# Patient Record
Sex: Female | Born: 1965
Health system: Southern US, Community
[De-identification: ages and names within clinical notes are randomized; demographics above are authoritative.]

## PROBLEM LIST (undated history)

## (undated) DIAGNOSIS — K219 Gastro-esophageal reflux disease without esophagitis: Secondary | ICD-10-CM

## (undated) DIAGNOSIS — F329 Major depressive disorder, single episode, unspecified: Secondary | ICD-10-CM

## (undated) DIAGNOSIS — C801 Malignant (primary) neoplasm, unspecified: Secondary | ICD-10-CM

## (undated) DIAGNOSIS — F419 Anxiety disorder, unspecified: Secondary | ICD-10-CM

## (undated) DIAGNOSIS — M810 Age-related osteoporosis without current pathological fracture: Secondary | ICD-10-CM

## (undated) DIAGNOSIS — H40059 Ocular hypertension, unspecified eye: Secondary | ICD-10-CM

## (undated) DIAGNOSIS — D509 Iron deficiency anemia, unspecified: Secondary | ICD-10-CM

## (undated) DIAGNOSIS — E785 Hyperlipidemia, unspecified: Secondary | ICD-10-CM

## (undated) DIAGNOSIS — N200 Calculus of kidney: Secondary | ICD-10-CM

## (undated) DIAGNOSIS — R51 Headache: Secondary | ICD-10-CM

## (undated) DIAGNOSIS — A048 Other specified bacterial intestinal infections: Secondary | ICD-10-CM

## (undated) DIAGNOSIS — E538 Deficiency of other specified B group vitamins: Secondary | ICD-10-CM

## (undated) DIAGNOSIS — Z8781 Personal history of (healed) traumatic fracture: Secondary | ICD-10-CM

## (undated) DIAGNOSIS — F502 Bulimia nervosa, unspecified: Secondary | ICD-10-CM

## (undated) DIAGNOSIS — F32A Depression, unspecified: Secondary | ICD-10-CM

## (undated) HISTORY — DX: Hyperlipidemia, unspecified: E78.5

## (undated) HISTORY — DX: Iron deficiency anemia, unspecified: D50.9

## (undated) HISTORY — DX: Headache: R51

## (undated) HISTORY — PX: LITHOTRIPSY: SUR834

## (undated) HISTORY — DX: Deficiency of other specified B group vitamins: E53.8

## (undated) HISTORY — DX: Major depressive disorder, single episode, unspecified: F32.9

## (undated) HISTORY — DX: Age-related osteoporosis without current pathological fracture: M81.0

## (undated) HISTORY — DX: Depression, unspecified: F32.A

## (undated) HISTORY — DX: Ocular hypertension, unspecified eye: H40.059

## (undated) HISTORY — DX: Bulimia nervosa: F50.2

## (undated) HISTORY — DX: Bulimia nervosa, unspecified: F50.20

## (undated) HISTORY — DX: Gastro-esophageal reflux disease without esophagitis: K21.9

## (undated) HISTORY — DX: Calculus of kidney: N20.0

## (undated) HISTORY — DX: Anxiety disorder, unspecified: F41.9

## (undated) HISTORY — DX: Other specified bacterial intestinal infections: A04.8

---

## 2000-02-12 ENCOUNTER — Emergency Department (HOSPITAL_COMMUNITY): Admission: EM | Admit: 2000-02-12 | Discharge: 2000-02-12 | Payer: Self-pay | Admitting: Emergency Medicine

## 2000-02-12 ENCOUNTER — Encounter: Payer: Self-pay | Admitting: Emergency Medicine

## 2000-10-12 ENCOUNTER — Emergency Department (HOSPITAL_COMMUNITY): Admission: EM | Admit: 2000-10-12 | Discharge: 2000-10-12 | Payer: Self-pay | Admitting: Emergency Medicine

## 2001-09-07 ENCOUNTER — Emergency Department (HOSPITAL_COMMUNITY): Admission: EM | Admit: 2001-09-07 | Discharge: 2001-09-07 | Payer: Self-pay | Admitting: Emergency Medicine

## 2004-05-14 ENCOUNTER — Ambulatory Visit: Payer: Self-pay | Admitting: Family Medicine

## 2004-06-14 ENCOUNTER — Ambulatory Visit: Payer: Self-pay | Admitting: Family Medicine

## 2004-07-21 ENCOUNTER — Ambulatory Visit: Payer: Self-pay | Admitting: Family Medicine

## 2004-08-18 ENCOUNTER — Ambulatory Visit: Payer: Self-pay | Admitting: Family Medicine

## 2004-09-08 ENCOUNTER — Ambulatory Visit (HOSPITAL_COMMUNITY): Admission: RE | Admit: 2004-09-08 | Discharge: 2004-09-08 | Payer: Self-pay | Admitting: *Deleted

## 2004-09-15 ENCOUNTER — Ambulatory Visit: Payer: Self-pay | Admitting: Family Medicine

## 2004-10-19 ENCOUNTER — Ambulatory Visit: Payer: Self-pay | Admitting: Family Medicine

## 2004-11-11 ENCOUNTER — Ambulatory Visit: Payer: Self-pay | Admitting: Family Medicine

## 2004-12-22 ENCOUNTER — Ambulatory Visit: Payer: Self-pay | Admitting: Family Medicine

## 2005-02-16 ENCOUNTER — Ambulatory Visit: Payer: Self-pay | Admitting: Family Medicine

## 2005-03-28 ENCOUNTER — Other Ambulatory Visit: Admission: RE | Admit: 2005-03-28 | Discharge: 2005-03-28 | Payer: Self-pay | Admitting: Family Medicine

## 2005-03-28 ENCOUNTER — Ambulatory Visit: Payer: Self-pay | Admitting: Family Medicine

## 2005-04-19 ENCOUNTER — Ambulatory Visit: Payer: Self-pay | Admitting: Family Medicine

## 2005-05-05 ENCOUNTER — Ambulatory Visit: Payer: Self-pay | Admitting: Internal Medicine

## 2005-05-06 ENCOUNTER — Ambulatory Visit: Payer: Self-pay | Admitting: Internal Medicine

## 2005-05-06 ENCOUNTER — Encounter (INDEPENDENT_AMBULATORY_CARE_PROVIDER_SITE_OTHER): Payer: Self-pay | Admitting: Specialist

## 2005-05-06 HISTORY — PX: ESOPHAGOGASTRODUODENOSCOPY: SHX1529

## 2005-05-06 LAB — HM COLONOSCOPY: HM Colonoscopy: NORMAL

## 2005-05-20 ENCOUNTER — Ambulatory Visit: Payer: Self-pay | Admitting: Family Medicine

## 2005-06-06 ENCOUNTER — Encounter: Admission: RE | Admit: 2005-06-06 | Discharge: 2005-06-06 | Payer: Self-pay | Admitting: Family Medicine

## 2005-06-22 ENCOUNTER — Ambulatory Visit: Payer: Self-pay | Admitting: Family Medicine

## 2005-07-28 ENCOUNTER — Ambulatory Visit: Payer: Self-pay | Admitting: Family Medicine

## 2005-08-26 ENCOUNTER — Ambulatory Visit: Payer: Self-pay | Admitting: Family Medicine

## 2005-09-23 ENCOUNTER — Ambulatory Visit: Payer: Self-pay | Admitting: Family Medicine

## 2005-09-28 ENCOUNTER — Ambulatory Visit: Payer: Self-pay | Admitting: Family Medicine

## 2005-10-27 ENCOUNTER — Ambulatory Visit: Payer: Self-pay | Admitting: Family Medicine

## 2005-11-22 ENCOUNTER — Ambulatory Visit: Payer: Self-pay | Admitting: Family Medicine

## 2005-12-09 ENCOUNTER — Ambulatory Visit: Payer: Self-pay | Admitting: Family Medicine

## 2005-12-23 ENCOUNTER — Ambulatory Visit: Payer: Self-pay | Admitting: Family Medicine

## 2006-01-24 ENCOUNTER — Ambulatory Visit: Payer: Self-pay | Admitting: Family Medicine

## 2006-03-06 ENCOUNTER — Ambulatory Visit: Payer: Self-pay | Admitting: Family Medicine

## 2006-03-23 ENCOUNTER — Ambulatory Visit: Payer: Self-pay | Admitting: Family Medicine

## 2006-04-28 ENCOUNTER — Ambulatory Visit: Payer: Self-pay | Admitting: Family Medicine

## 2006-10-04 ENCOUNTER — Ambulatory Visit: Payer: Self-pay | Admitting: Family Medicine

## 2006-10-04 LAB — CONVERTED CEMR LAB
ALT: 16 units/L (ref 0–40)
AST: 20 units/L (ref 0–37)
Albumin: 3.6 g/dL (ref 3.5–5.2)
Alkaline Phosphatase: 60 units/L (ref 39–117)
BUN: 2 mg/dL — ABNORMAL LOW (ref 6–23)
Basophils Absolute: 0 10*3/uL (ref 0.0–0.1)
Basophils Relative: 0.5 % (ref 0.0–1.0)
Bilirubin, Direct: 0.1 mg/dL (ref 0.0–0.3)
CO2: 31 meq/L (ref 19–32)
Calcium: 9.1 mg/dL (ref 8.4–10.5)
Chloride: 109 meq/L (ref 96–112)
Cholesterol: 187 mg/dL (ref 0–200)
Creatinine, Ser: 0.6 mg/dL (ref 0.4–1.2)
Eosinophils Absolute: 0.3 10*3/uL (ref 0.0–0.6)
Eosinophils Relative: 6.3 % — ABNORMAL HIGH (ref 0.0–5.0)
GFR calc Af Amer: 142 mL/min
GFR calc non Af Amer: 118 mL/min
Glucose, Bld: 75 mg/dL (ref 70–99)
HCT: 38.8 % (ref 36.0–46.0)
HDL: 27.3 mg/dL — ABNORMAL LOW (ref >39.0)
Hemoglobin: 13.5 g/dL (ref 12.0–15.0)
LDL Cholesterol: 122 mg/dL — ABNORMAL HIGH (ref 0–99)
Lymphocytes Relative: 33 % (ref 12.0–46.0)
MCHC: 34.7 g/dL (ref 30.0–36.0)
MCV: 92.1 fL (ref 78.0–100.0)
Monocytes Absolute: 0.7 10*3/uL (ref 0.2–0.7)
Monocytes Relative: 16.1 % — ABNORMAL HIGH (ref 3.0–11.0)
Neutro Abs: 2.1 10*3/uL (ref 1.4–7.7)
Neutrophils Relative %: 44.1 % (ref 43.0–77.0)
Platelets: 370 10*3/uL (ref 150–400)
Potassium: 4.6 meq/L (ref 3.5–5.1)
RBC: 4.21 M/uL (ref 3.87–5.11)
RDW: 15.4 % — ABNORMAL HIGH (ref 11.5–14.6)
Sodium: 144 meq/L (ref 135–145)
TSH: 3.26 microintl units/mL (ref 0.35–5.50)
Total Bilirubin: 0.5 mg/dL (ref 0.3–1.2)
Total CHOL/HDL Ratio: 6.8
Total Protein: 6 g/dL (ref 6.0–8.3)
Triglycerides: 190 mg/dL — ABNORMAL HIGH (ref 0–149)
VLDL: 38 mg/dL (ref 0–40)
Vitamin B-12: 267 pg/mL (ref 211–911)
WBC: 4.6 10*3/uL (ref 4.5–10.5)

## 2006-10-11 ENCOUNTER — Encounter: Payer: Self-pay | Admitting: Family Medicine

## 2006-10-11 ENCOUNTER — Ambulatory Visit: Payer: Self-pay | Admitting: Family Medicine

## 2006-10-11 ENCOUNTER — Other Ambulatory Visit: Admission: RE | Admit: 2006-10-11 | Discharge: 2006-10-11 | Payer: Self-pay | Admitting: Family Medicine

## 2006-10-11 LAB — CONVERTED CEMR LAB: Pap Smear: NORMAL

## 2006-10-24 DIAGNOSIS — M810 Age-related osteoporosis without current pathological fracture: Secondary | ICD-10-CM

## 2006-10-24 HISTORY — DX: Age-related osteoporosis without current pathological fracture: M81.0

## 2006-11-02 ENCOUNTER — Ambulatory Visit: Payer: Self-pay | Admitting: Family Medicine

## 2006-11-15 ENCOUNTER — Encounter: Payer: Self-pay | Admitting: Family Medicine

## 2006-11-15 ENCOUNTER — Ambulatory Visit: Payer: Self-pay | Admitting: Family Medicine

## 2006-11-15 DIAGNOSIS — F332 Major depressive disorder, recurrent severe without psychotic features: Secondary | ICD-10-CM | POA: Insufficient documentation

## 2006-11-15 DIAGNOSIS — R630 Anorexia: Secondary | ICD-10-CM | POA: Insufficient documentation

## 2006-11-15 DIAGNOSIS — Z87442 Personal history of urinary calculi: Secondary | ICD-10-CM | POA: Insufficient documentation

## 2006-11-15 DIAGNOSIS — F3289 Other specified depressive episodes: Secondary | ICD-10-CM | POA: Insufficient documentation

## 2006-11-15 DIAGNOSIS — F329 Major depressive disorder, single episode, unspecified: Secondary | ICD-10-CM

## 2006-11-15 DIAGNOSIS — M1610 Unilateral primary osteoarthritis, unspecified hip: Secondary | ICD-10-CM | POA: Insufficient documentation

## 2006-11-15 DIAGNOSIS — E785 Hyperlipidemia, unspecified: Secondary | ICD-10-CM | POA: Insufficient documentation

## 2006-11-15 DIAGNOSIS — M810 Age-related osteoporosis without current pathological fracture: Secondary | ICD-10-CM | POA: Insufficient documentation

## 2007-01-02 ENCOUNTER — Ambulatory Visit: Payer: Self-pay | Admitting: Family Medicine

## 2007-01-05 ENCOUNTER — Ambulatory Visit: Payer: Self-pay | Admitting: Family Medicine

## 2007-01-31 ENCOUNTER — Ambulatory Visit: Payer: Self-pay | Admitting: Internal Medicine

## 2007-03-08 ENCOUNTER — Telehealth (INDEPENDENT_AMBULATORY_CARE_PROVIDER_SITE_OTHER): Payer: Self-pay | Admitting: *Deleted

## 2007-03-09 ENCOUNTER — Encounter: Payer: Self-pay | Admitting: Family Medicine

## 2007-03-09 ENCOUNTER — Ambulatory Visit: Payer: Self-pay | Admitting: Family Medicine

## 2007-03-09 DIAGNOSIS — D649 Anemia, unspecified: Secondary | ICD-10-CM | POA: Insufficient documentation

## 2007-03-14 LAB — CONVERTED CEMR LAB: Vitamin B-12: 320 pg/mL (ref 211–911)

## 2007-03-16 ENCOUNTER — Telehealth: Payer: Self-pay | Admitting: Family Medicine

## 2007-03-26 ENCOUNTER — Ambulatory Visit: Payer: Self-pay | Admitting: Family Medicine

## 2007-03-27 LAB — CONVERTED CEMR LAB
ALT: 18 units/L (ref 0–35)
AST: 24 units/L (ref 0–37)
Albumin: 3.9 g/dL (ref 3.5–5.2)
Alkaline Phosphatase: 67 units/L (ref 39–117)
BUN: 4 mg/dL — ABNORMAL LOW (ref 6–23)
Basophils Absolute: 0 10*3/uL (ref 0.0–0.1)
Basophils Relative: 0.3 % (ref 0.0–1.0)
Bilirubin, Direct: 0.1 mg/dL (ref 0.0–0.3)
CO2: 33 meq/L — ABNORMAL HIGH (ref 19–32)
Calcium: 9.5 mg/dL (ref 8.4–10.5)
Chloride: 105 meq/L (ref 96–112)
Cholesterol: 238 mg/dL (ref 0–200)
Creatinine, Ser: 0.7 mg/dL (ref 0.4–1.2)
Direct LDL: 196 mg/dL
Eosinophils Absolute: 0.3 10*3/uL (ref 0.0–0.6)
Eosinophils Relative: 4.6 % (ref 0.0–5.0)
Ferritin: 10.5 ng/mL (ref 10.0–291.0)
Folate: 1.5 ng/mL
GFR calc Af Amer: 119 mL/min
GFR calc non Af Amer: 99 mL/min
Glucose, Bld: 80 mg/dL (ref 70–99)
HCT: 39.1 % (ref 36.0–46.0)
HDL: 16.1 mg/dL — ABNORMAL LOW (ref >39.0)
Hemoglobin: 13 g/dL (ref 12.0–15.0)
Iron: 100 ug/dL (ref 42–145)
Lymphocytes Relative: 24.1 % (ref 12.0–46.0)
MCHC: 33.3 g/dL (ref 30.0–36.0)
MCV: 89.5 fL (ref 78.0–100.0)
Monocytes Absolute: 0.7 10*3/uL (ref 0.2–0.7)
Monocytes Relative: 11.1 % — ABNORMAL HIGH (ref 3.0–11.0)
Neutro Abs: 3.6 10*3/uL (ref 1.4–7.7)
Neutrophils Relative %: 59.9 % (ref 43.0–77.0)
Platelets: 472 10*3/uL — ABNORMAL HIGH (ref 150–400)
Potassium: 4.4 meq/L (ref 3.5–5.1)
RBC: 4.36 M/uL (ref 3.87–5.11)
RDW: 16.8 % — ABNORMAL HIGH (ref 11.5–14.6)
Sodium: 143 meq/L (ref 135–145)
TSH: 2.45 microintl units/mL (ref 0.35–5.50)
Total Bilirubin: 0.8 mg/dL (ref 0.3–1.2)
Total CHOL/HDL Ratio: 14.8
Total Protein: 7.1 g/dL (ref 6.0–8.3)
Triglycerides: 224 mg/dL (ref 0–149)
VLDL: 45 mg/dL — ABNORMAL HIGH (ref 0–40)
Vitamin B-12: 528 pg/mL (ref 211–911)
WBC: 6.1 10*3/uL (ref 4.5–10.5)

## 2007-04-09 ENCOUNTER — Ambulatory Visit: Payer: Self-pay | Admitting: Family Medicine

## 2007-04-10 ENCOUNTER — Ambulatory Visit: Payer: Self-pay | Admitting: Family Medicine

## 2007-04-10 DIAGNOSIS — N3090 Cystitis, unspecified without hematuria: Secondary | ICD-10-CM | POA: Insufficient documentation

## 2007-04-10 LAB — CONVERTED CEMR LAB
Bilirubin Urine: NEGATIVE
Ketones, urine, test strip: NEGATIVE
Nitrite: NEGATIVE
Protein, U semiquant: NEGATIVE
Urobilinogen, UA: 0.2

## 2007-05-09 ENCOUNTER — Ambulatory Visit: Payer: Self-pay | Admitting: Family Medicine

## 2007-06-11 ENCOUNTER — Ambulatory Visit: Payer: Self-pay | Admitting: Family Medicine

## 2007-06-11 ENCOUNTER — Encounter: Payer: Self-pay | Admitting: Family Medicine

## 2007-06-12 ENCOUNTER — Telehealth: Payer: Self-pay | Admitting: Family Medicine

## 2007-06-12 LAB — CONVERTED CEMR LAB
ALT: 24 units/L (ref 0–35)
Albumin: 4.2 g/dL (ref 3.5–5.2)
Alkaline Phosphatase: 53 units/L (ref 39–117)
BUN: 5 mg/dL — ABNORMAL LOW (ref 6–23)
Basophils Absolute: 0 10*3/uL (ref 0.0–0.1)
Basophils Relative: 0.1 % (ref 0.0–1.0)
CO2: 29 meq/L (ref 19–32)
Calcium: 10 mg/dL (ref 8.4–10.5)
Chloride: 99 meq/L (ref 96–112)
Cholesterol: 153 mg/dL (ref 0–200)
Creatinine, Ser: 0.7 mg/dL (ref 0.4–1.2)
HDL: 31.7 mg/dL — ABNORMAL LOW (ref >39.0)
LDL Cholesterol: 92 mg/dL (ref 0–99)
MCHC: 33.8 g/dL (ref 30.0–36.0)
Monocytes Absolute: 0.6 10*3/uL (ref 0.2–0.7)
Monocytes Relative: 8.9 % (ref 3.0–11.0)
Platelets: 365 10*3/uL (ref 150–400)
Potassium: 4.4 meq/L (ref 3.5–5.1)
RBC: 4.71 M/uL (ref 3.87–5.11)
RDW: 13.9 % (ref 11.5–14.6)
TSH: 3.19 microintl units/mL (ref 0.35–5.50)
Total Bilirubin: 0.9 mg/dL (ref 0.3–1.2)
Total CHOL/HDL Ratio: 4.8
Total Protein: 7 g/dL (ref 6.0–8.3)
Triglycerides: 147 mg/dL (ref 0–149)
VLDL: 29 mg/dL (ref 0–40)

## 2007-07-17 ENCOUNTER — Ambulatory Visit: Payer: Self-pay | Admitting: Family Medicine

## 2007-07-23 ENCOUNTER — Ambulatory Visit: Payer: Self-pay | Admitting: Family Medicine

## 2007-07-23 DIAGNOSIS — N760 Acute vaginitis: Secondary | ICD-10-CM | POA: Insufficient documentation

## 2007-07-23 LAB — CONVERTED CEMR LAB
Blood in Urine, dipstick: NEGATIVE
Glucose, Urine, Semiquant: NEGATIVE
Ketones, urine, test strip: NEGATIVE
Nitrite: NEGATIVE
Protein, U semiquant: NEGATIVE
Specific Gravity, Urine: <1.005
pH: 7.5

## 2007-08-29 ENCOUNTER — Ambulatory Visit: Payer: Self-pay | Admitting: Family Medicine

## 2007-08-31 ENCOUNTER — Ambulatory Visit: Payer: Self-pay | Admitting: Family Medicine

## 2007-08-31 DIAGNOSIS — M8540 Solitary bone cyst, unspecified site: Secondary | ICD-10-CM | POA: Insufficient documentation

## 2007-08-31 DIAGNOSIS — H00019 Hordeolum externum unspecified eye, unspecified eyelid: Secondary | ICD-10-CM | POA: Insufficient documentation

## 2007-10-05 ENCOUNTER — Ambulatory Visit: Payer: Self-pay | Admitting: Family Medicine

## 2007-11-23 ENCOUNTER — Ambulatory Visit: Payer: Self-pay | Admitting: Family Medicine

## 2007-12-17 ENCOUNTER — Ambulatory Visit: Payer: Self-pay | Admitting: Family Medicine

## 2007-12-17 ENCOUNTER — Encounter: Admission: RE | Admit: 2007-12-17 | Discharge: 2007-12-17 | Payer: Self-pay | Admitting: Family Medicine

## 2007-12-17 DIAGNOSIS — R51 Headache: Secondary | ICD-10-CM | POA: Insufficient documentation

## 2007-12-17 DIAGNOSIS — R519 Headache, unspecified: Secondary | ICD-10-CM | POA: Insufficient documentation

## 2008-01-09 ENCOUNTER — Telehealth: Payer: Self-pay | Admitting: Family Medicine

## 2008-01-11 ENCOUNTER — Ambulatory Visit: Payer: Self-pay | Admitting: Family Medicine

## 2008-02-11 ENCOUNTER — Ambulatory Visit: Payer: Self-pay | Admitting: Family Medicine

## 2008-03-12 ENCOUNTER — Ambulatory Visit: Payer: Self-pay | Admitting: Family Medicine

## 2008-04-11 ENCOUNTER — Ambulatory Visit: Payer: Self-pay | Admitting: Family Medicine

## 2008-05-15 ENCOUNTER — Ambulatory Visit: Payer: Self-pay | Admitting: Family Medicine

## 2008-05-15 DIAGNOSIS — K219 Gastro-esophageal reflux disease without esophagitis: Secondary | ICD-10-CM | POA: Insufficient documentation

## 2008-05-15 DIAGNOSIS — IMO0002 Reserved for concepts with insufficient information to code with codable children: Secondary | ICD-10-CM | POA: Insufficient documentation

## 2008-05-20 LAB — CONVERTED CEMR LAB
Albumin: 3.8 g/dL (ref 3.5–5.2)
Alkaline Phosphatase: 49 units/L (ref 39–117)
Basophils Absolute: 0.1 10*3/uL (ref 0.0–0.1)
Bilirubin, Direct: 0.1 mg/dL (ref 0.0–0.3)
Eosinophils Absolute: 0.2 10*3/uL (ref 0.0–0.7)
GFR calc Af Amer: 102 mL/min
GFR calc non Af Amer: 84 mL/min
HCT: 39.3 % (ref 36.0–46.0)
HDL: 32.1 mg/dL — ABNORMAL LOW (ref >39.0)
MCV: 99.3 fL (ref 78.0–100.0)
Monocytes Absolute: 0.5 10*3/uL (ref 0.1–1.0)
Neutrophils Relative %: 58 % (ref 43.0–77.0)
Platelets: 298 10*3/uL (ref 150–400)
Potassium: 3.7 meq/L (ref 3.5–5.1)
RDW: 12.4 % (ref 11.5–14.6)
Sodium: 141 meq/L (ref 135–145)
Total Bilirubin: 0.7 mg/dL (ref 0.3–1.2)
Triglycerides: 90 mg/dL (ref 0–149)
VLDL: 18 mg/dL (ref 0–40)

## 2008-06-11 ENCOUNTER — Ambulatory Visit: Payer: Self-pay | Admitting: Family Medicine

## 2008-07-14 ENCOUNTER — Telehealth: Payer: Self-pay | Admitting: Family Medicine

## 2008-07-15 ENCOUNTER — Ambulatory Visit: Payer: Self-pay | Admitting: Family Medicine

## 2008-08-13 ENCOUNTER — Ambulatory Visit: Payer: Self-pay | Admitting: Family Medicine

## 2008-09-15 ENCOUNTER — Ambulatory Visit: Payer: Self-pay | Admitting: Family Medicine

## 2008-10-22 ENCOUNTER — Ambulatory Visit: Payer: Self-pay | Admitting: Family Medicine

## 2008-10-28 ENCOUNTER — Telehealth: Payer: Self-pay | Admitting: Family Medicine

## 2008-11-21 ENCOUNTER — Ambulatory Visit: Payer: Self-pay | Admitting: Family Medicine

## 2008-12-19 ENCOUNTER — Ambulatory Visit: Payer: Self-pay | Admitting: Family Medicine

## 2009-01-22 ENCOUNTER — Ambulatory Visit: Payer: Self-pay | Admitting: Family Medicine

## 2009-02-17 ENCOUNTER — Ambulatory Visit: Payer: Self-pay | Admitting: Family Medicine

## 2009-03-10 ENCOUNTER — Telehealth: Payer: Self-pay | Admitting: Family Medicine

## 2009-03-27 ENCOUNTER — Ambulatory Visit: Payer: Self-pay | Admitting: Family Medicine

## 2009-04-20 ENCOUNTER — Ambulatory Visit: Payer: Self-pay | Admitting: Family Medicine

## 2009-05-05 ENCOUNTER — Telehealth: Payer: Self-pay | Admitting: Family Medicine

## 2009-05-11 ENCOUNTER — Ambulatory Visit: Payer: Self-pay | Admitting: Family Medicine

## 2009-05-11 ENCOUNTER — Other Ambulatory Visit: Admission: RE | Admit: 2009-05-11 | Discharge: 2009-05-11 | Payer: Self-pay | Admitting: Family Medicine

## 2009-05-11 ENCOUNTER — Encounter: Payer: Self-pay | Admitting: Family Medicine

## 2009-05-11 LAB — HM PAP SMEAR

## 2009-05-13 LAB — CONVERTED CEMR LAB
BUN: 8 mg/dL (ref 6–23)
Bilirubin, Direct: 0 mg/dL (ref 0.0–0.3)
Chloride: 104 meq/L (ref 96–112)
Cholesterol: 162 mg/dL (ref 0–200)
Creatinine, Ser: 0.9 mg/dL (ref 0.4–1.2)
Eosinophils Absolute: 0.4 10*3/uL (ref 0.0–0.7)
Eosinophils Relative: 5.9 % — ABNORMAL HIGH (ref 0.0–5.0)
HDL: 35.1 mg/dL — ABNORMAL LOW (ref >39.00)
LDL Cholesterol: 97 mg/dL (ref 0–99)
MCV: 102.3 fL — ABNORMAL HIGH (ref 78.0–100.0)
Monocytes Absolute: 0.7 10*3/uL (ref 0.1–1.0)
Neutrophils Relative %: 53.3 % (ref 43.0–77.0)
Platelets: 306 10*3/uL (ref 150.0–400.0)
Total Bilirubin: 0.9 mg/dL (ref 0.3–1.2)
Triglycerides: 152 mg/dL — ABNORMAL HIGH (ref 0.0–149.0)
VLDL: 30.4 mg/dL (ref 0.0–40.0)
Vit D, 25-Hydroxy: 46 ng/mL (ref 30–89)
Vitamin B-12: 959 pg/mL — ABNORMAL HIGH (ref 211–911)
WBC: 7.4 10*3/uL (ref 4.5–10.5)

## 2009-05-14 ENCOUNTER — Encounter: Payer: Self-pay | Admitting: Internal Medicine

## 2009-05-25 ENCOUNTER — Ambulatory Visit: Payer: Self-pay | Admitting: Family Medicine

## 2009-05-25 ENCOUNTER — Encounter (INDEPENDENT_AMBULATORY_CARE_PROVIDER_SITE_OTHER): Payer: Self-pay | Admitting: *Deleted

## 2009-05-26 ENCOUNTER — Telehealth: Payer: Self-pay | Admitting: Family Medicine

## 2009-05-29 ENCOUNTER — Ambulatory Visit: Payer: Self-pay | Admitting: Cardiology

## 2009-06-19 ENCOUNTER — Ambulatory Visit: Payer: Self-pay | Admitting: Family Medicine

## 2009-07-22 ENCOUNTER — Ambulatory Visit: Payer: Self-pay | Admitting: Family Medicine

## 2009-07-22 ENCOUNTER — Telehealth: Payer: Self-pay | Admitting: Family Medicine

## 2009-08-25 ENCOUNTER — Ambulatory Visit: Payer: Self-pay | Admitting: Family Medicine

## 2009-09-21 ENCOUNTER — Ambulatory Visit: Payer: Self-pay | Admitting: Family Medicine

## 2009-10-23 ENCOUNTER — Ambulatory Visit: Payer: Self-pay | Admitting: Family Medicine

## 2009-10-30 ENCOUNTER — Ambulatory Visit: Payer: Self-pay | Admitting: Family Medicine

## 2009-10-30 DIAGNOSIS — J019 Acute sinusitis, unspecified: Secondary | ICD-10-CM | POA: Insufficient documentation

## 2009-11-05 ENCOUNTER — Telehealth: Payer: Self-pay | Admitting: Family Medicine

## 2009-11-30 ENCOUNTER — Ambulatory Visit: Payer: Self-pay | Admitting: Family Medicine

## 2009-12-22 ENCOUNTER — Telehealth: Payer: Self-pay | Admitting: *Deleted

## 2010-01-07 ENCOUNTER — Ambulatory Visit: Payer: Self-pay | Admitting: Family Medicine

## 2010-02-02 ENCOUNTER — Telehealth: Payer: Self-pay | Admitting: Family Medicine

## 2010-02-05 ENCOUNTER — Ambulatory Visit: Payer: Self-pay | Admitting: Family Medicine

## 2010-02-09 ENCOUNTER — Telehealth: Payer: Self-pay | Admitting: Family Medicine

## 2010-03-08 ENCOUNTER — Ambulatory Visit: Payer: Self-pay | Admitting: Family Medicine

## 2010-04-09 ENCOUNTER — Ambulatory Visit: Payer: Self-pay | Admitting: Family Medicine

## 2010-04-09 DIAGNOSIS — K121 Other forms of stomatitis: Secondary | ICD-10-CM | POA: Insufficient documentation

## 2010-04-09 DIAGNOSIS — K0500 Acute gingivitis, plaque induced: Secondary | ICD-10-CM | POA: Insufficient documentation

## 2010-04-09 DIAGNOSIS — K1230 Oral mucositis (ulcerative), unspecified: Secondary | ICD-10-CM | POA: Insufficient documentation

## 2010-04-09 DIAGNOSIS — F411 Generalized anxiety disorder: Secondary | ICD-10-CM | POA: Insufficient documentation

## 2010-05-19 ENCOUNTER — Ambulatory Visit: Payer: Self-pay | Admitting: Family Medicine

## 2010-05-27 ENCOUNTER — Ambulatory Visit: Payer: Self-pay | Admitting: Family Medicine

## 2010-05-27 DIAGNOSIS — J209 Acute bronchitis, unspecified: Secondary | ICD-10-CM | POA: Insufficient documentation

## 2010-06-09 ENCOUNTER — Telehealth: Payer: Self-pay | Admitting: Family Medicine

## 2010-06-24 ENCOUNTER — Ambulatory Visit: Payer: Self-pay | Admitting: Internal Medicine

## 2010-07-27 ENCOUNTER — Encounter
Admission: RE | Admit: 2010-07-27 | Discharge: 2010-07-27 | Payer: Self-pay | Source: Home / Self Care | Attending: Family Medicine | Admitting: Family Medicine

## 2010-07-29 ENCOUNTER — Ambulatory Visit
Admission: RE | Admit: 2010-07-29 | Discharge: 2010-07-29 | Payer: Self-pay | Source: Home / Self Care | Attending: Family Medicine | Admitting: Family Medicine

## 2010-08-04 ENCOUNTER — Telehealth: Payer: Self-pay | Admitting: Family Medicine

## 2010-08-19 NOTE — Progress Notes (Signed)
Summary: refill temazepam  Phone Note Refill Request Message from:  Fax from Pharmacy on August 04, 2010 4:39 PM  Refills Requested: Medication #1:  TEMAZEPAM 30 MG CAPS 1 at bedtime.   [facility] Ref[facility]: [facility]/2010 [facility]   Method Requested: Fax to Local Pharmacy Initial call taken by: Duard Brady LPN,  August 04, 2010 4:39 PM    Prescriptions: TEMAZEPAM 30 MG CAPS (TEMAZEPAM) 1 at bedtime  #30 x 5   Entered by:   Duard Brady LPN   Authorized by:   Nelwyn Salisbury MD   Signed by:   Duard Brady LPN on 16/04/9603   Method used:   Historical   RxID:   [id number]  [facility]

## 2010-08-19 NOTE — Assessment & Plan Note (Signed)
Summary: B12 INJ//SLM   Nurse Visit   Allergies: 1)  ! * Temazepam  Medication Administration  Injection # 1:    Medication: Vit B12 1000 mcg    Diagnosis: ANEMIA NOS (ICD-285.9)    Route: IM    Site: RUOQ gluteus    Exp Date: 09/12    Lot #: [id number]    Mfr: American Regent    Patient tolerated injection without complications    Given by: Raechel Ache, RN (Nov 30, 2009 10:34 AM)  Orders Added: 1)  Vit B12 1000 mcg [J3420] 2)  Admin of Therapeutic Inj  intramuscular or subcutaneous [[id number]]

## 2010-08-19 NOTE — Progress Notes (Signed)
Summary: sinus  WALK IN  Phone Note Call from Patient   Caller: Patient Call For: Rickard Patience, MD Summary of Call: Pt walks in complaining of bilateral ear congestion, sore throat and sinus congestion. Temp.  98.6  She is taking Mucinex (plain). [facility] Dr. Scotty Court will call in RX Initial call taken by: Lynann Beaver CMA,  November 05, 2009 3:12 PM  Follow-up for Phone Call        per dr Scotty Court call in amoxicillin  and getmucinex d two times a day  amoxicilin called in [facility]  Follow-up by: Pura Spice, RN,  November 05, 2009 3:31 PM    New/Updated Medications: AMOXICILLIN 500 MG CAPS (AMOXICILLIN) 1 by mouth three times a day take until completed. MUCINEX D 60-600 MG XR12H-TAB (PSEUDOEPHEDRINE-GUAIFENESIN) one by mouth bid Prescriptions: MUCINEX D 60-600 MG XR12H-TAB (PSEUDOEPHEDRINE-GUAIFENESIN) one by mouth bid  #30 x 1   Entered by:   Lynann Beaver CMA   Authorized by:   Judithann Sheen MD   Signed by:   Lynann Beaver CMA on 11/05/2009   Method used:   [facility]        [phone]       [address] [place], [place]  [address]       Ph: [phone] or [phone]       Fax: [phone]   RxID:   [id number] AMOXICILLIN 500 MG CAPS (AMOXICILLIN) 1 by mouth three times a day take until completed.  #30 x 0   Entered by:   Pura Spice, RN   Authorized by:   Judithann Sheen MD   Signed by:   Pura Spice, RN on 11/05/2009   Method used:   [facility]        [facility]  [address] [phone]* (retail)       [address] [place], [place]  [address]       Ph: [phone] or [phone]       Fax: [phone]   RxID:   [id number]

## 2010-08-19 NOTE — Assessment & Plan Note (Signed)
Summary: b12 inj//ccm   Nurse Visit   Allergies: No Known Drug Allergies  Medication Administration  Injection # 1:    Medication: Vit B12 1000 mcg    Diagnosis: ANEMIA NOS (ICD-285.9)    Route: IM    Site: LUOQ gluteus    Exp Date: 01/16/2012    Lot #: [id number]    Mfr: American Regent    Patient tolerated injection without complications    Given by: Jeremy Johann CMA (June 24, 2010 9:39 AM)  Orders Added: 1)  Admin of Therapeutic Inj  intramuscular or subcutaneous [[address]] 2)  Vit B12 1000 mcg [J3420]

## 2010-08-19 NOTE — Assessment & Plan Note (Signed)
Summary: SORE MOUTH? - DENIES COLD SXS OR INJURY / B12 INJ // RS   Vital Signs:  Patient profile:   45 year old female Weight:      100 pounds Temp:     98.2 degrees F Pulse rate:   63 / minute BP sitting:   92 / 62  (left arm)  Vitals Entered By: Pura Spice, RN (April 09, 2010 11:34 AM)  Contraindications/Deferment of Procedures/Staging:    Test/Procedure: FLU VAX    Reason for deferment: patient declined  CC: check throat    History of Present Illness: Here for one month of intermittent burning sensations in her mouth, either the roof of the mouth or the upper throat. No visible lesions. No trouble swallowing. Also her anxiety has been bothering her lately. She has days where she can't relax, has trouble sleeping, etc. No depression.   Allergies: 1)  ! * Temazepam  Past History:  Past Medical History: Reviewed history from 05/15/2008 and no changes required. Depression Hyperlipidemia Osteoporosis, DEXA on 10-24-06 Bulimia Headache GERD left hip dysplasia, sees Dr. Rennis Chris elevated intraocular pressures, sees Dr. Hazle Quant Nephrolithiasis, hx of, has seen Dr. Aldean Ast  Review of Systems  The patient denies anorexia, fever, weight loss, weight gain, vision loss, decreased hearing, hoarseness, chest pain, syncope, dyspnea on exertion, peripheral edema, prolonged cough, headaches, hemoptysis, abdominal pain, melena, hematochezia, severe indigestion/heartburn, hematuria, incontinence, genital sores, muscle weakness, suspicious skin lesions, transient blindness, difficulty walking, depression, unusual weight change, abnormal bleeding, enlarged lymph nodes, angioedema, breast masses, and testicular masses.    Physical Exam  General:  Well-developed,well-nourished,in no acute distress; alert,appropriate and cooperative throughout examination Mouth:  she has diffuse gingival irritation around her teeth. No visible leisons or erythema Neck:  No deformities, masses, or  tenderness noted. Psych:  Oriented X3, memory intact for recent and remote, normally interactive, good eye contact, and slightly anxious.     Impression & Recommendations:  Problem # 1:  ANXIETY STATE, UNSPECIFIED (ICD-300.00)  Her updated medication list for this problem includes:    Ativan 2 Mg Tabs (Lorazepam) .Marland Kitchen... 1 q 6 hours as needed anxiety  Problem # 2:  ACUTE GINGIVITIS PLAQUE INDUCED (ICD-523.00)  Problem # 3:  STOMATITIS AND MUCOSITIS UNSPECIFIED (ICD-528.00)  Complete Medication List: 1)  Cyanocobalamin 1000 Mcg/ml Inj Soln (Cyanocobalamin) .Marland Kitchen.. 1000 micrograms im per month 2)  Zocor 40 Mg Tabs (Simvastatin) .Marland Kitchen.. 1 by mouth once daily 3)  Folic Acid 400 Mcg Tabs (Folic acid) .Marland Kitchen.. 1 by mouth once daily 4)  Mag-g 500 Mg Tabs (Magnesium gluconate) .Marland Kitchen.. 1 by mouth once daily 5)  Lac-hydrin 12 % Lotn (Ammonium lactate) .... Apply two times a day as needed 6)  Vicodin 5-500 Mg Tabs (Hydrocodone-acetaminophen) .Marland Kitchen.. 1 q 6 hours as needed pain 7)  Temazepam 30 Mg Caps (Temazepam) .Marland Kitchen.. 1 at bedtime 8)  Mucinex D 60-600 Mg Xr12h-tab (Pseudoephedrine-guaifenesin) .... One by mouth bid 9)  Nystatin 100000 Unit/ml Susp (Nystatin) .... Swish and spit 5 cc in mouth q 6 hours as needed 10)  Ativan 2 Mg Tabs (Lorazepam) .Marland Kitchen.. 1 q 6 hours as needed anxiety  Other Orders: Vit B12 1000 mcg (J3420) Admin of Therapeutic Inj  intramuscular or subcutaneous ([address])  Patient Instructions: 1)  Please schedule a follow-up appointment as needed . Encouraged her to see her dentist ASAP Prescriptions: ATIVAN 2 MG TABS (LORAZEPAM) 1 q 6 hours as needed anxiety  #60 x 2   Entered and Authorized by:   Tera Mater  Clent Ridges MD   Signed by:   Nelwyn Salisbury MD on 04/09/2010   Method used:   Print then Give to Patient   RxID:   [id number] NYSTATIN 100000 UNIT/ML SUSP (NYSTATIN) swish and spit 5 cc in mouth q 6 hours as needed  #300 x 2   Entered and Authorized by:   Nelwyn Salisbury MD   Signed by:    Nelwyn Salisbury MD on 04/09/2010   Method used:   [facility]        [facility] [phone][facility] ([facility]ail)       [address] [place], [place]  [address]       Ph: [phone] or [phone]       Fax: [phone]   RxID:   [id number]    Medication Administration  Injection # 1:    Medication: Vit B12 1000 mcg    Diagnosis: ANEMIA NOS (ICD-285.9)    Route: IM    Site: RUOQ gluteus    Exp Date: 01/2012    Lot #: [id number]    Mfr: American Regent    Patient tolerated injection without complications    Given by: Pura Spice, RN (April 09, 2010 11:42 AM)  Orders Added: 1)  Vit B12 1000 mcg [J3420] 2)  Admin of Therapeutic Inj  intramuscular or subcutaneous [96[address]] 3)  Est. Patient Level IV [87564]

## 2010-08-19 NOTE — Assessment & Plan Note (Signed)
Summary: B12 INJ // RS   Nurse Visit   Allergies: 1)  ! * Temazepam  Medication Administration  Injection # 1:    Medication: Vit B12 1000 mcg    Diagnosis: ANEMIA NOS (ICD-285.9)    Route: IM    Site: LUOQ gluteus    Exp Date: 9/12    Lot #: [id number]    Mfr: American Regent    Patient tolerated injection without complications    Given by: Alfred Levins, CMA (August 25, 2009 1:59 PM)  Orders Added: 1)  Vit B12 1000 mcg [J3420] 2)  Admin of Therapeutic Inj  intramuscular or subcutaneous [[address]]

## 2010-08-19 NOTE — Assessment & Plan Note (Signed)
Summary: B12//CCM   Nurse Visit   Allergies: 1)  ! * Temazepam  Medication Administration  Injection # 1:    Medication: Vit B12 1000 mcg    Diagnosis: ANEMIA NOS (ICD-285.9)    Route: IM    Site: RUOQ gluteus    Exp Date: 03/2011    Lot #: [id number]    Mfr: American Regent    Patient tolerated injection without complications    Given by: Raechel Ache, RN (September 21, 2009 9:56 AM)  Orders Added: 1)  Vit B12 1000 mcg [J3420] 2)  Admin of Therapeutic Inj  intramuscular or subcutaneous [[address]]

## 2010-08-19 NOTE — Assessment & Plan Note (Signed)
Summary: ST, H/A, CONGESTION // RS   Vital Signs:  Patient profile:   45 year old female Weight:      101 pounds Temp:     98.3 degrees F oral BP sitting:   90 / 66  (left arm) Cuff size:   regular  Vitals Entered By: Raechel Ache, RN (October 30, 2009 10:20 AM) CC: C/o sore throat, some congestion, cough, facial pressure.   History of Present Illness: Here for one week of HA, sinus pressure, ST, and a dry cough. No fever.   Allergies: 1)  ! * Temazepam  Past History:  Past Medical History: Reviewed history from 05/15/2008 and no changes required. Depression Hyperlipidemia Osteoporosis, DEXA on 10-24-06 Bulimia Headache GERD left hip dysplasia, sees Dr. Rennis Chris elevated intraocular pressures, sees Dr. Hazle Quant Nephrolithiasis, hx of, has seen Dr. Aldean Ast  Review of Systems  The patient denies anorexia, fever, weight loss, weight gain, vision loss, decreased hearing, hoarseness, chest pain, syncope, dyspnea on exertion, peripheral edema, hemoptysis, abdominal pain, melena, hematochezia, severe indigestion/heartburn, hematuria, incontinence, genital sores, muscle weakness, suspicious skin lesions, transient blindness, difficulty walking, depression, unusual weight change, abnormal bleeding, enlarged lymph nodes, angioedema, breast masses, and testicular masses.    Physical Exam  General:  Well-developed,well-nourished,in no acute distress; alert,appropriate and cooperative throughout examination Head:  Normocephalic and atraumatic without obvious abnormalities. No apparent alopecia or balding. Eyes:  No corneal or conjunctival inflammation noted. EOMI. Perrla. Funduscopic exam benign, without hemorrhages, exudates or papilledema. Vision grossly normal. Ears:  External ear exam shows no significant lesions or deformities.  Otoscopic examination reveals clear canals, tympanic membranes are intact bilaterally without bulging, retraction, inflammation or discharge. Hearing is  grossly normal bilaterally. Nose:  External nasal examination shows no deformity or inflammation. Nasal mucosa are pink and moist without lesions or exudates. Mouth:  Oral mucosa and oropharynx without lesions or exudates.  Teeth in good repair. Neck:  No deformities, masses, or tenderness noted. Lungs:  Normal respiratory effort, chest expands symmetrically. Lungs are clear to auscultation, no crackles or wheezes.   Impression & Recommendations:  Problem # 1:  ACUTE SINUSITIS, UNSPECIFIED (ICD-461.9)  Her updated medication list for this problem includes:    Zithromax Z-pak 250 Mg Tabs (Azithromycin) .Marland Kitchen... As directed  Complete Medication List: 1)  Cyanocobalamin 1000 Mcg/ml Inj Soln (Cyanocobalamin) .Marland Kitchen.. 1000 micrograms im per month 2)  Zocor 40 Mg Tabs (Simvastatin) .Marland Kitchen.. 1 by mouth once daily 3)  Folic Acid 400 Mcg Tabs (Folic acid) .Marland Kitchen.. 1 by mouth once daily 4)  Mag-g 500 Mg Tabs (Magnesium gluconate) .Marland Kitchen.. 1 by mouth once daily 5)  Lac-hydrin 12 % Lotn (Ammonium lactate) .... Apply two times a day as needed 6)  Vicodin 5-500 Mg Tabs (Hydrocodone-acetaminophen) .Marland Kitchen.. 1 q 6 hours as needed pain 7)  Temazepam 30 Mg Caps (Temazepam) .Marland Kitchen.. 1 at bedtime 8)  Zithromax Z-pak 250 Mg Tabs (Azithromycin) .... As directed  Patient Instructions: 1)  Please schedule a follow-up appointment as needed .  Prescriptions: ZITHROMAX Z-PAK 250 MG TABS (AZITHROMYCIN) as directed  #1 x 0   Entered and Authorized by:   Nelwyn Salisbury MD   Signed by:   Nelwyn Salisbury MD on 10/30/2009   Method used:   Electronically to        [facility]  [phone]* (retail)       [address]       [place], [place]  [address]  Ph: [phone] or [phone]       Fax: [phone]   RxID:   [id number]

## 2010-08-19 NOTE — Assessment & Plan Note (Signed)
Summary: B-12 INJ/CJR   Nurse Visit   Allergies: 1)  ! * Temazepam  Medication Administration  Injection # 1:    Medication: Vit B12 1000 mcg    Diagnosis: ANEMIA NOS (ICD-285.9)    Route: IM    Site: LUOQ gluteus    Exp Date: 02/13    Lot #: [id number]    Mfr: American Regent    Patient tolerated injection without complications    Given by: Raechel Ache, RN (February 05, 2010 9:14 AM)  Orders Added: 1)  Vit B12 1000 mcg [J3420] 2)  Admin of Therapeutic Inj  intramuscular or subcutaneous [81191]

## 2010-08-19 NOTE — Assessment & Plan Note (Signed)
Summary: b-12 inj/cjr   Nurse Visit   Allergies: 1)  ! * Temazepam  Medication Administration  Injection # 1:    Medication: Vit B12 1000 mcg    Diagnosis: ANEMIA NOS (ICD-285.9)    Route: IM    Site: RUOQ gluteus    Exp Date: 8/12    Lot #: [id number]    Mfr: American Regent    Patient tolerated injection without complications    Given by: Alfred Levins, CMA (July 22, 2009 10:15 AM)  Orders Added: 1)  Vit B12 1000 mcg [J3420] 2)  Admin of Therapeutic Inj  intramuscular or subcutaneous [[address]]

## 2010-08-19 NOTE — Assessment & Plan Note (Signed)
Summary: b12 inj//ccm  rt gluteal    Nurse Visit   Allergies: No Known Drug Allergies  Medication Administration  Injection # 1:    Medication: Vit B12 1000 mcg    Diagnosis: ANEMIA NOS (ICD-285.9)    Route: IM    Site: RUOQ gluteus    Exp Date: 02/2012    Lot #: [id number]    Mfr: American Regent    Patient tolerated injection without complications    Given by: Pura Spice, RN (July 29, 2010 9:23 AM)  Orders Added: 1)  Vit B12 1000 mcg [J3420] 2)  Admin of Therapeutic Inj  intramuscular or subcutaneous [[address]]

## 2010-08-19 NOTE — Assessment & Plan Note (Signed)
Summary: COUGH, CONGESTION // RS   Vital Signs:  Patient profile:   45 year old female Weight:      96 pounds BMI:     18.51 O2 Sat:      93 % Temp:     98.2 degrees F Pulse rate:   71 / minute BP sitting:   94 / 62  (left arm) Cuff size:   regular  Vitals Entered By: Pura Spice, RN (May 27, 2010 10:53 AM) CC: cough congestion   History of Present Illness: Here for one week of chest congestion and coughing up yellow sputum. No fever.   Allergies (verified): No Known Drug Allergies  Past History:  Past Medical History: Reviewed history from 05/15/2008 and no changes required. Depression Hyperlipidemia Osteoporosis, DEXA on 10-24-06 Bulimia Headache GERD left hip dysplasia, sees Dr. Rennis Chris elevated intraocular pressures, sees Dr. Hazle Quant Nephrolithiasis, hx of, has seen Dr. Aldean Ast  Past Surgical History: Reviewed history from 05/15/2008 and no changes required. EGD 05-06-05 per Dr. Leone Payor, normal  Review of Systems  The patient denies anorexia, fever, weight loss, weight gain, vision loss, decreased hearing, hoarseness, chest pain, syncope, dyspnea on exertion, peripheral edema, headaches, hemoptysis, abdominal pain, melena, hematochezia, severe indigestion/heartburn, hematuria, incontinence, genital sores, muscle weakness, suspicious skin lesions, transient blindness, difficulty walking, depression, unusual weight change, abnormal bleeding, enlarged lymph nodes, angioedema, breast masses, and testicular masses.    Physical Exam  General:  Well-developed,well-nourished,in no acute distress; alert,appropriate and cooperative throughout examination Head:  Normocephalic and atraumatic without obvious abnormalities. No apparent alopecia or balding. Eyes:  No corneal or conjunctival inflammation noted. EOMI. Perrla. Funduscopic exam benign, without hemorrhages, exudates or papilledema. Vision grossly normal. Ears:  External ear exam shows no significant lesions or  deformities.  Otoscopic examination reveals clear canals, tympanic membranes are intact bilaterally without bulging, retraction, inflammation or discharge. Hearing is grossly normal bilaterally. Nose:  External nasal examination shows no deformity or inflammation. Nasal mucosa are pink and moist without lesions or exudates. Mouth:  Oral mucosa and oropharynx without lesions or exudates.  Teeth in good repair. Neck:  No deformities, masses, or tenderness noted. Lungs:  scattered rhonchi   Impression & Recommendations:  Problem # 1:  ACUTE BRONCHITIS (ICD-466.0)  The following medications were removed from the medication list:    Mucinex D 60-600 Mg Xr12h-tab (Pseudoephedrine-guaifenesin) ..... One by mouth bid Her updated medication list for this problem includes:    Zithromax Z-pak 250 Mg Tabs (Azithromycin) .Marland Kitchen... As directed  Complete Medication List: 1)  Cyanocobalamin 1000 Mcg/ml Inj Soln (Cyanocobalamin) .Marland Kitchen.. 1000 micrograms im per month 2)  Vicodin 5-500 Mg Tabs (Hydrocodone-acetaminophen) .Marland Kitchen.. 1 q 6 hours as needed pain 3)  Temazepam 30 Mg Caps (Temazepam) .Marland Kitchen.. 1 at bedtime 4)  Zithromax Z-pak 250 Mg Tabs (Azithromycin) .... As directed  Patient Instructions: 1)  Please schedule a follow-up appointment as needed .  Prescriptions: ZITHROMAX Z-PAK 250 MG TABS (AZITHROMYCIN) as directed  #1 x 0   Entered and Authorized by:   Nelwyn Salisbury MD   Signed by:   Nelwyn Salisbury MD on 05/27/2010   Method used:   [facility]        [facility] [phone][facility] ([facility]ail[facility]       [address]       [place], [place]  [address]       Ph: [phone] or [phone]       Fax:  [phone]   RxID:   [id number]    Orders Added: 1)  Est. Patient Level IV [[address]]

## 2010-08-19 NOTE — Progress Notes (Signed)
Summary: refill Temazepam  Phone Note Refill Request Message from:  Fax from Pharmacy on February 09, 2010 4:32 PM  Refills Requested: Medication #1:  TEMAZEPAM 30 MG CAPS 1 at bedtime   Dosage confirmed as above?Dosage Confirmed   Supply Requested: 1 month   Last Refilled: 01/09/2010  Method Requested: Fax to Local Pharmacy Initial call taken by: Raechel Ache, RN,  February 09, 2010 4:33 PM [facility]: [facility]  Follow-up for Phone Call        call in #30 with 5 rf Follow-up by: Nelwyn Salisbury MD,  February 09, 2010 5:10 PM    Prescriptions: TEMAZEPAM 30 MG CAPS (TEMAZEPAM) 1 at bedtime  #30 x 5   Entered by:   Raechel Ache, RN   Authorized by:   Nelwyn Salisbury MD   Signed by:   Raechel Ache, RN on 02/09/2010   Method used:   Historical   RxID:   [id number]

## 2010-08-19 NOTE — Progress Notes (Signed)
Summary: refill hydrocodone   Phone Note From Pharmacy   Caller: [facility]   [phone] Summary of Call: refill hydrocodone  Initial call taken by: Pura Spice, RN,  June 09, 2010 1:39 PM  Follow-up for Phone Call        call in #30 with 5 rf Follow-up by: Nelwyn Salisbury MD,  June 09, 2010 2:59 PM  Additional Follow-up for Phone Call Additional follow up Details #1::        done  Additional Follow-up by: Pura Spice, RN,  June 09, 2010 3:13 PM    New/Updated Medications: VICODIN 5-500 MG TABS (HYDROCODONE-ACETAMINOPHEN) 1 q 6 hours as needed pain Prescriptions: VICODIN 5-500 MG TABS (HYDROCODONE-ACETAMINOPHEN) 1 q 6 hours as needed pain  #30 x 5   Entered by:   Pura Spice, RN   Authorized by:   Nelwyn Salisbury MD   Signed by:   Pura Spice, RN on 06/09/2010   Method used:   Telephoned to ...       [facility]  [address] [phone][facility] (retail)       [address]       [place], [place]  [address]       Ph: [phone] or [phone]       Fax: [phone]   RxID:   [id number]

## 2010-08-19 NOTE — Progress Notes (Signed)
Summary: refill  Phone Note Call from Patient Call back at Home Phone [phone]   Caller: vm Reason for Call: Refill Medication Summary of Call: Hydrocodone Acet [facility] [phone] as needed migraines Initial call taken by: Rudy Jew, RN,  February 02, 2010 1:03 PM  Follow-up for Phone Call        call in Vicodin #30 with 3 rf Follow-up by: Nelwyn Salisbury MD,  February 02, 2010 4:58 PM  Additional Follow-up for Phone Call Additional follow up Details #1::        Rx Called In Additional Follow-up by: Raechel Ache, RN,  February 02, 2010 5:02 PM    Prescriptions: VICODIN 5-500 MG TABS (HYDROCODONE-ACETAMINOPHEN) 1 q 6 hours as needed pain  #30 x 3   Entered by:   Raechel Ache, RN   Authorized by:   Nelwyn Salisbury MD   Signed by:   Raechel Ache, RN on 02/02/2010   Method used:   Historical   RxID:   [id number]

## 2010-08-19 NOTE — Progress Notes (Signed)
Summary: new rx's  Phone Note Call from Patient Call back at Home Phone [phone]   Caller: Patient Call For: Delmon Andrada Summary of Call: pt came in for b12 shot and wants to switch to boniva.  Wants to take it once a month instead of weekly.  Also had temazepam back in 2007 and wants to go back on it.  [facility] Initial call taken by: Alfred Levins, CMA,  July 22, 2009 10:14 AM  Follow-up for Phone Call        switch to [facility] monthly, give her a year supply. Also give her Temazepam 30mg  at bedtime , #30 with 5 rf Follow-up by: Nelwyn Salisbury MD,  July 22, 2009 10:21 AM  Additional Follow-up for Phone Call Additional follow up Details #1::        Phone Call Completed, Rx Called In Additional Follow-up by: Alfred Levins, CMA,  July 22, 2009 10:41 AM    New/Updated Medications: BONIVA 150 MG TABS (IBANDRONATE SODIUM) 1 by mouth monthly TEMAZEPAM 30 MG CAPS (TEMAZEPAM) 1 at bedtime Prescriptions: TEMAZEPAM 30 MG CAPS (TEMAZEPAM) 1 at bedtime  #30 x 5   Entered by:   Alfred Levins, CMA   Authorized by:   Nelwyn Salisbury MD   Signed by:   Alfred Levins, CMA on 07/22/2009   Method used:   Telephoned to ...       [facility]  [address] [phone][facility] (retail)       [address]       [place], [place]  [address]       Ph: [phone] or [phone]       Fax: [phone]   RxID:   [id number] BONIVA 150 MG TABS (IBANDRONATE SODIUM) 1 by mouth monthly  #4 x 12   Entered by:   Alfred Levins, CMA   Authorized by:   Nelwyn Salisbury MD   Signed by:   Alfred Levins, CMA on 07/22/2009   Method used:   Electronically to        [facility]  [address] [phone]* (retail)       [address] [place], [place]  [address]       Ph: [phone] or [phone]       Fax: [phone]   RxID:   [id number]

## 2010-08-19 NOTE — Assessment & Plan Note (Signed)
Summary: B12 INJ // RS   Nurse Visit   Allergies: 1)  ! * Temazepam  Medication Administration  Injection # 1:    Medication: Vit B12 1000 mcg    Diagnosis: ANEMIA NOS (ICD-285.9)    Route: IM    Site: LUOQ gluteus    Exp Date: 04/13    Lot #: [id number]    Mfr: APP    Patient tolerated injection without complications    Given by: Raechel Ache, RN (January 07, 2010 10:10 AM)  Orders Added: 1)  Vit B12 1000 mcg [J3420] 2)  Admin of Therapeutic Inj  intramuscular or subcutaneous [[id number]]

## 2010-08-19 NOTE — Assessment & Plan Note (Signed)
Summary: b12 inj//ccm   Nurse Visit   Allergies: 1)  ! * Temazepam  Medication Administration  Injection # 1:    Medication: Vit B12 1000 mcg    Diagnosis: ANEMIA NOS (ICD-285.9)    Route: IM    Site: LUOQ gluteus    Exp Date: 902012    Lot #: [id number]    Mfr: [employer]    Patient tolerated injection without complications    Given by: Duard Brady LPN (October 23, 452 9:10 AM)  Orders Added: 1)  Vit B12 1000 mcg [J3420] 2)  Admin of Therapeutic Inj  intramuscular or subcutaneous [09811]

## 2010-08-19 NOTE — Progress Notes (Signed)
Summary: migraine hydroco/acet request  Phone Note From Pharmacy Message from:  Fax from Pharmacy on December 22, 2009 11:21 AM  Caller: Sherri Sear [facility] Call For: dr fry  Summary of Call: patient is requesting a refill of hydrocodone 5/500.  the last fill was 09-15-09.  Is this okay to fill? Initial call taken by: Kern Reap CMA Duncan Dull),  December 22, 2009 11:22 AM  Follow-up for Phone Call        why is the patient on vicodin ?  and how is she using it? How many does she have left?   Follow-up by: Madelin Headings MD,  December 22, 2009 12:52 PM  Additional Follow-up for Phone Call Additional follow up Details #1::        left message on machine for patient to return our call Additional Follow-up by: Kern Reap CMA Duncan Dull),  December 22, 2009 1:45 PM    Additional Follow-up for Phone Call Additional follow up Details #2::    Has a migraine.  Hasn't had one in awhile.  Uses the hydrocodone acet 5/500mg  for it.  [phone]  Patient called back & left this info on VM.  Rudy Jew, RN  December 22, 2009 2:24 PM  Follow-up by: Rudy Jew, RN,  December 22, 2009 2:22 PM  Additional Follow-up for Phone Call Additional follow up Details #3:: Details for Additional Follow-up Action Taken: Per Dr. Fabian Sharp- Okay to do #30 tabs only then rest has to be done by Dr. Clent Ridges. Rx called in. Left message on machine to call back. Additional Follow-up by: Romualdo Bolk, CMA (AAMA),  December 22, 2009 5:50 PM  New/Updated Medications: VICODIN 5-500 MG TABS (HYDROCODONE-ACETAMINOPHEN) 1 q 6 hours as needed pain Prescriptions: VICODIN 5-500 MG TABS (HYDROCODONE-ACETAMINOPHEN) 1 q 6 hours as needed pain  #30 x 0   Entered by:   Romualdo Bolk, CMA (AAMA)   Authorized by:   Madelin Headings MD   Signed by:   Romualdo Bolk, CMA (AAMA) on 12/22/2009   Method used:   Telephoned to ...       [facility]  [address] [phone] ([facility]ail[facility]       [address]       [place], [place]  [address]  Ph: [phone] or [phone]       Fax: [phone]   RxID:   [id number]

## 2010-08-19 NOTE — Assessment & Plan Note (Signed)
Summary: B-12 INJ/CJR   Nurse Visit   Allergies: 1)  ! * Temazepam  Medication Administration  Injection # 1:    Medication: Vit B12 1000 mcg    Diagnosis: ANEMIA NOS (ICD-285.9)    Route: IM    Site: LUOQ gluteus    Exp Date: 04/13    Lot #: [id number]    Mfr: [employer]    Patient tolerated injection without complications    Given by: Raechel Ache, RN (March 08, 2010 9:43 AM)  Orders Added: 1)  Vit B12 1000 mcg [J3420] 2)  Admin of Therapeutic Inj  intramuscular or subcutaneous [44010]

## 2010-08-19 NOTE — Assessment & Plan Note (Signed)
Summary: B12 INJ // RS rt gluteal    Nurse Visit   Allergies: 1)  ! * Temazepam  Medication Administration  Injection # 1:    Medication: Vit B12 1000 mcg    Diagnosis: ANEMIA NOS (ICD-285.9)    Route: IM    Site: RUOQ gluteus    Exp Date: 1390    Lot #: [id number]    Mfr: [employer]    Patient tolerated injection without complications    Given by: Pura Spice, RN (May 19, 2010 9:24 AM)  Orders Added: 1)  Vit B12 1000 mcg [J3420] 2)  Admin of Therapeutic Inj  intramuscular or subcutaneous [95621]

## 2010-08-25 ENCOUNTER — Ambulatory Visit (INDEPENDENT_AMBULATORY_CARE_PROVIDER_SITE_OTHER): Payer: BC Managed Care – PPO | Admitting: Family Medicine

## 2010-08-25 DIAGNOSIS — D649 Anemia, unspecified: Secondary | ICD-10-CM

## 2010-08-25 MED ORDER — CYANOCOBALAMIN 1000 MCG/ML IJ SOLN
1000.0000 ug | Freq: Once | INTRAMUSCULAR | Status: AC
  Administered 2010-08-25: 1000 ug via INTRAMUSCULAR

## 2010-09-16 ENCOUNTER — Ambulatory Visit (HOSPITAL_COMMUNITY)
Admission: RE | Admit: 2010-09-16 | Discharge: 2010-09-16 | Disposition: A | Discharge status: Home / Self Care | Payer: BC Managed Care – PPO | Source: Ambulatory Visit | Attending: Emergency Medicine | Admitting: Emergency Medicine

## 2010-09-16 DIAGNOSIS — M79609 Pain in unspecified limb: Secondary | ICD-10-CM | POA: Insufficient documentation

## 2010-09-16 DIAGNOSIS — F17200 Nicotine dependence, unspecified, uncomplicated: Secondary | ICD-10-CM | POA: Insufficient documentation

## 2010-09-22 ENCOUNTER — Ambulatory Visit (INDEPENDENT_AMBULATORY_CARE_PROVIDER_SITE_OTHER): Payer: BC Managed Care – PPO | Admitting: Family Medicine

## 2010-09-22 ENCOUNTER — Encounter: Payer: Self-pay | Admitting: Family Medicine

## 2010-09-22 VITALS — BP 88/62 | Temp 98.5°F | Ht 60.5 in | Wt 93.0 lb

## 2010-09-22 DIAGNOSIS — D649 Anemia, unspecified: Secondary | ICD-10-CM

## 2010-09-22 DIAGNOSIS — R1013 Epigastric pain: Secondary | ICD-10-CM

## 2010-09-22 DIAGNOSIS — E538 Deficiency of other specified B group vitamins: Secondary | ICD-10-CM

## 2010-09-22 DIAGNOSIS — K921 Melena: Secondary | ICD-10-CM

## 2010-09-22 MED ORDER — OMEPRAZOLE 40 MG PO CPDR: 40.0000 mg | DELAYED_RELEASE_CAPSULE | Freq: Every day | ORAL | Status: DC

## 2010-09-22 MED ORDER — CYANOCOBALAMIN 1000 MCG/ML IJ SOLN
1000.0000 ug | Freq: Once | INTRAMUSCULAR | Status: AC
  Administered 2010-09-22: 1000 ug via INTRAMUSCULAR

## 2010-09-22 NOTE — Progress Notes (Signed)
  Subjective:    Patient ID: Emma Palmer, female    DOB: May 09, 1966, 45 y.o.   MRN: [id number]  HPI Here for 6 weeks of abdominal pains and bloody stools. She has had GERD and H. Pylori in years past, and she had an EGD per Dr. Leone Payor in 2006. She had done well until 6 weeks ago when she developed recurrent epigastirc pains. No change in BMs at that time, no fever or nausea or heatburn. She went to Urgent Care and had a positive blood test for H. Pylori. She was given a 10 day PrevPack which she finished out. The epigastric pains are no better now, and she has additional problems. She is having pains in the lower abdomen also, and she has seen a small amount  of bright red blood in the stool on occasion. Still no nausea or vomiting. Her stools are formed and somewhat hard.    Review of Systems  Constitutional: Negative.  Negative for fever.  Respiratory: Negative.   Cardiovascular: Negative.   Gastrointestinal: Positive for abdominal pain, constipation and blood in stool. Negative for nausea, vomiting and abdominal distention.  Genitourinary: Negative.        Objective:   Physical Exam  Constitutional: She appears well-developed and well-nourished.  Pulmonary/Chest: Effort normal and breath sounds normal. She exhibits no tenderness.  Abdominal: Bowel sounds are normal. She exhibits no distension and no mass. There is tenderness. There is no rebound and no guarding.          Assessment & Plan:  She has continuing epigastric pain despite being treated for H. Pylori infection, but she has not had any acid suppression for several weeks now. We will start her on Omeprazole. See Dr. Leone Payor again. She may require upper and lower endoscopy

## 2010-09-23 ENCOUNTER — Encounter (INDEPENDENT_AMBULATORY_CARE_PROVIDER_SITE_OTHER): Payer: Self-pay | Admitting: *Deleted

## 2010-09-28 NOTE — Letter (Signed)
Summary: New Patient letter  [facility]  [address] [place], [place] [address]   Phone: [phone]  Fax: [phone]       09/23/2010 MRN: [id number]  Emma Palmer [address] [place], [place]  [address]  [place]  Dear Emma Palmer,  Welcome to the Gastroenterology Division at [facility].    You are scheduled to see Dr.  Leone Payor on 11-10-10 at 2:00P.M. on the 3rd floor at [facility], [address]  We ask that you try to arrive at our office 15 minutes prior to your appointment time to allow for check-in.  We would like you to complete the enclosed self-administered evaluation form prior to your visit and bring it with you on the day of your appointment.  We will review it with you.  Also, please bring a complete list of all your medications or, if you prefer, bring the medication bottles and we will list them.  Please bring your insurance card so that we may make a copy of it.  If your insurance requires a referral to see a specialist, please bring your referral form from your primary care physician.  Co-payments are due at the time of your visit and may be paid by cash, check or credit card.     Your office visit will consist of a consult with your physician (includes a physical exam), any laboratory testing he/she may order, scheduling of any necessary diagnostic testing (e.g. x-ray, ultrasound, CT-scan), and scheduling of a procedure (e.g. Endoscopy, Colonoscopy) if required.  Please allow enough time on your schedule to allow for any/all of these possibilities.    If you cannot keep your appointment, please call [phone] to cancel or reschedule prior to your appointment date.  This allows [place] the opportunity to schedule an appointment for another patient in need of care.  If you do not cancel or reschedule by 5 p.m. the business day prior to your appointment date, you will be charged a $50.00 late cancellation/no-show fee.    Thank you for choosing  [facility] for your medical needs.  We appreciate the opportunity to care for you.  Please visit [place] at our website  to learn more about our practice.                     Sincerely,                                                             The Gastroenterology Division

## 2010-10-13 ENCOUNTER — Encounter: Payer: Self-pay | Admitting: Family Medicine

## 2010-10-27 ENCOUNTER — Ambulatory Visit (INDEPENDENT_AMBULATORY_CARE_PROVIDER_SITE_OTHER): Payer: BC Managed Care – PPO | Admitting: Internal Medicine

## 2010-10-27 DIAGNOSIS — E538 Deficiency of other specified B group vitamins: Secondary | ICD-10-CM

## 2010-10-27 MED ORDER — CYANOCOBALAMIN 1000 MCG/ML IJ SOLN
1000.0000 ug | Freq: Once | INTRAMUSCULAR | Status: AC
  Administered 2010-10-27: 1000 ug via INTRAMUSCULAR

## 2010-11-03 ENCOUNTER — Other Ambulatory Visit (INDEPENDENT_AMBULATORY_CARE_PROVIDER_SITE_OTHER): Payer: BC Managed Care – PPO | Admitting: Family Medicine

## 2010-11-03 DIAGNOSIS — K921 Melena: Secondary | ICD-10-CM

## 2010-11-03 DIAGNOSIS — IMO0001 Reserved for inherently not codable concepts without codable children: Secondary | ICD-10-CM

## 2010-11-03 LAB — HEMOCCULT GUIAC POC 1CARD (OFFICE)
Card #2 Fecal Occult Blod, POC: NEGATIVE
Fecal Occult Blood, POC: NEGATIVE

## 2010-11-08 ENCOUNTER — Telehealth: Payer: Self-pay | Admitting: Family Medicine

## 2010-11-08 NOTE — Telephone Encounter (Signed)
They were negative

## 2010-11-08 NOTE — Telephone Encounter (Signed)
Pt called re: the results from stool sample. Pls call

## 2010-11-09 ENCOUNTER — Telehealth: Payer: Self-pay | Admitting: *Deleted

## 2010-11-09 NOTE — Telephone Encounter (Signed)
Pt. Informed.

## 2010-11-09 NOTE — Telephone Encounter (Signed)
Pt would like stool results

## 2010-11-09 NOTE — Telephone Encounter (Signed)
They were negative

## 2010-11-10 ENCOUNTER — Encounter: Payer: Self-pay | Admitting: Internal Medicine

## 2010-11-10 ENCOUNTER — Ambulatory Visit (INDEPENDENT_AMBULATORY_CARE_PROVIDER_SITE_OTHER): Payer: BC Managed Care – PPO | Admitting: Internal Medicine

## 2010-11-10 VITALS — BP 80/58 | HR 64 | Ht 60.5 in | Wt 94.0 lb

## 2010-11-10 DIAGNOSIS — K589 Irritable bowel syndrome without diarrhea: Secondary | ICD-10-CM

## 2010-11-10 DIAGNOSIS — K921 Melena: Secondary | ICD-10-CM

## 2010-11-10 DIAGNOSIS — K5909 Other constipation: Secondary | ICD-10-CM

## 2010-11-10 DIAGNOSIS — R1013 Epigastric pain: Secondary | ICD-10-CM

## 2010-11-10 MED ORDER — PEG-KCL-NACL-NASULF-NA ASC-C 100 G PO SOLR: 1.0000 | ORAL | Status: DC

## 2010-11-10 NOTE — Progress Notes (Signed)
Subjective:    Patient ID: Emma Palmer, female    DOB: [birth year], 45 y.o.   MRN: [id number]  HPI 55-year-old white woman seen in the past for epigastric pain in the setting of recent therapy for bulimia, in 2006. She says she is not bulimic anymore. Her problems lately and somewhat chronically remaining gnawing epigastric pain that comes and goes. She has been treated for H. pylori over the years, I think always for positive serology. She said last summer she started having this variable morning pain that may occur with eating but could occur any time. It may disrupt sleep. He was treated for H. pylori again but didn't fully benefit. She has taken PPI therapy including omeprazole and Nexium but developed leg cramps and is not using those anymore. Remains constipated fairly chronically using Carter's pills. She is using hydrocodone and soma Advil every day at least because of the epigastric pain. She'll use the Carter's pills about once a week with extensive defecation. Once in January she passed some blood, it sounds like it was bright red after defecation. Subsequently Dr. Clent Ridges had Hemoccults done that were negative. Sounds like her weight has been overall stable. She has nausea but no vomiting. She frequently feels bloated and gassy. As far as the epigastric pain goes it's there most of the time.  Abdominal ultrasound is unrevealing, and performed for the last couple of months.  Past medical and surgical history, social history and family history as well as medications and allergies reviewed and updated in the database. Review of Systems This is somewhat diffusely positive and includes anxiety some back pain in the midthoracic spine at times. She feels very tired and depressed at times. She has palpitations occasionally. In February and March she had severe cramps in her Is what she thought was related to Nexium and then omeprazole. She has some mouth and tongue sores and ulcers at times and a sore  throat at times. She has insomnia. She relates that she used ipecac 2 and was vomiting over the years and though not using it anymore is concerned and anxious that might have damaged her. She feels that she urinates excessively infrequently. It is not painful. She did see 2 other gastroenterology providers while waiting to see me, there was a rather wait to schedule that she decided not to pursue further evaluation with either. I had seen her in 2006. CBC in March normal.    Objective:   Physical Exam Thin but well-developed and well-nourished white workman in no acute distress Eyes anicteric and round and reactive to light Mouth posterior pharynx free of lesions Neck supple no thyromegaly or mass no supraclavicular or cervical adenopathy Lungs clear Heart S1-S2 no rubs or gallops The abdomen is thin soft somewhat mildly tender throughout but worse in the epigastrium to deep palpation. Lower extremities free of edema Back is nontender no CVA tenderness Skin is tanned deeply Rectal exam is deferred until colonoscopy Mood and affect are appropriate       Assessment & Plan:  #1 hematochezia this may be anorectal. In the setting of her overall problems and her age a colonoscopy is suggested and she agrees to proceed. Risks benefits and indications are explained.  #2 epigastric pain chronic and worsening space overall this is probably a functional problem. She is now off PPI therapy. She is somewhat fixated on H. pylori. Gastric biopsies to look for this would be helpful. I suspect they will be negative but if not we can  address that. She had something called mastic gum extract she had taken but stopped. This is reported to eradicate H. pylori and there is some evidence to suggest that had some efficacy. She is currently using hydrocodone chronically mixed with Advil. This may be contributing to some of her pain and her nausea and a rebound phenomenon and weaning this will be important. Await the  EGD.  #3 IBS is suspected, constipation predominant and suspected functional dyspepsia  #4 chronic constipation probably unchanged I think she will need chronic MiraLax plus or minus stimulant laxatives occasionally. Amitiza is another possibility. Her narcotic use certainly may be part of this is well and we discussed that briefly she will need to work on reducing and hopefully eliminating that is not really appropriate therapy for chronic epigastric pain.

## 2010-11-10 NOTE — Patient Instructions (Signed)
You have been scheduled for a Endoscopy as well as a Colonoscopy. Instructions have been provided. Your prep solution has been sent to your pharmacy. Please reduce the amount of Hydrocodone and Advil as much as possible.

## 2010-11-11 ENCOUNTER — Encounter: Payer: Self-pay | Admitting: Internal Medicine

## 2010-11-15 ENCOUNTER — Encounter: Payer: Self-pay | Admitting: Internal Medicine

## 2010-11-16 ENCOUNTER — Encounter: Payer: BC Managed Care – PPO | Admitting: Internal Medicine

## 2010-12-03 NOTE — Assessment & Plan Note (Signed)
[facility]                            [facility] OFFICE NOTE   NAME:Emma Palmer, DANELLY HASSINGER                        MRN:          [id number]  DATE:10/11/2006                            DOB:          1966/01/27    This is a 45 year old woman here for a complete physical examination.  In general, she is doing well today.  I saw her just one week ago with  complaints of epigastric pain and an uncontrollable desire to force  herself to vomit.  As noted throughout her chart, she has bulimia, and  we felt that part of this recent problem was uncontrolled acid reflux  and gastritis.  Indeed, we put her on Nexium twice a day and upped her  dose of Prozac.  This has been extremely effective.  As of 3 days ago,  she no longer has vomited at all and does not even desire to vomit.  She  has no pain and is able to take food and liquids quite well.  She would  like to continue the Nexium as is; however, the increased dose of Prozac  is a bit too strong.  She says it is hard to concentrate, and she feels  very lightheaded and sleepy.  Otherwise, no particular problems of note.  We had been giving her monthly B-12 shots until she stopped coming in  this past September.  She was not sure if she needed to do so or not.  She has felt a slight increase in her fatigue level since then.   PAST MEDICAL HISTORY/FAMILY HISTORY/SOCIAL HISTORY, ETC:  For other  details, refer to her last physical note dated March 28, 2005.   ALLERGIES:  TEMAZEPAM.   CURRENT MEDICATIONS:  1. Fosamax 35 mg once a week.  2. Nexium 40 mg b.i.d.  3. Prozac 80 mg per day.   OBJECTIVE:  VITAL SIGNS:  Height 5 feet, 1 inch.  Weight 96 pounds.  Blood pressure 112/62, pulse 72 and regular.  GENERAL:  She appears to be at her baseline.  She is quite thin.  SKIN:  Free of significant lesions.  HEENT:  Eyes are clear.  She wears glasses.  Ears clear.  Oropharynx  clear.  NECK:  Supple without  lymphadenopathy or masses.  LUNGS:  Clear.  CARDIAC:  Rate and rhythm regular without gallops, murmurs or rubs.  Distal pulses are full.  Breasts and axillae are clear.  ABDOMEN:  Soft.  Normal bowel sounds.  Nontender.  No masses.  PELVIC:  External genitalia within normal limits.  Vagina is clear.  Cervix is clear.  Pap smear was obtained.  Uterus not enlarged.  No  adnexal masses or tenderness.  EXTREMITIES:  No clubbing, cyanosis, or edema.  NEUROLOGIC:  Grossly intact.   She was here for fasting labs on October 04, 2006.  These were all within  normal limits with the exception of her lipid panel.  Triglycerides were  high at 190.  LDL high at 122 and HDL low at 27.  Her vitamin B-12 is  still in the normal range, but has  dropped to the lower end of normal at  267.   ASSESSMENT AND PLAN:  1. Complete physical.  We talked about getting regular exercise, and I      encouraged her to set up a mammogram, which she should get yearly      from this point on.  2. Bulimia.  Will decrease her Prozac to 40 mg in the morning and      another 20 mg in the evening.  Hopefully, splitting her dosage up      will make it more tolerable to her.  3. Gastroesophageal reflux disease, stable.  4. Osteoporosis, stable.  5. B-12 deficiency.  She was given a B-12 shot today and will get back      on our monthly schedule.     Tera Mater. Clent Ridges, MD  Electronically Signed    SAF/MedQ  DD: 10/11/2006  DT: 10/11/2006  Job #: [id number]

## 2010-12-22 ENCOUNTER — Ambulatory Visit (INDEPENDENT_AMBULATORY_CARE_PROVIDER_SITE_OTHER): Payer: BC Managed Care – PPO | Admitting: Family Medicine

## 2010-12-22 DIAGNOSIS — D649 Anemia, unspecified: Secondary | ICD-10-CM

## 2010-12-22 MED ORDER — CYANOCOBALAMIN 1000 MCG/ML IJ SOLN
1000.0000 ug | Freq: Once | INTRAMUSCULAR | Status: AC
  Administered 2010-12-22: 1000 ug via INTRAMUSCULAR

## 2010-12-23 ENCOUNTER — Telehealth: Payer: Self-pay | Admitting: *Deleted

## 2010-12-23 NOTE — Telephone Encounter (Signed)
Refill on hydrocodone/apap 5/500  

## 2010-12-23 NOTE — Telephone Encounter (Signed)
Call in #60 with 5 rf 

## 2010-12-24 MED ORDER — HYDROCODONE-ACETAMINOPHEN 5-500 MG PO TABS: 1.0000 | ORAL_TABLET | Freq: Four times a day (QID) | ORAL | Status: DC | PRN

## 2011-01-18 ENCOUNTER — Other Ambulatory Visit: Payer: Self-pay | Admitting: *Deleted

## 2011-01-18 MED ORDER — TEMAZEPAM 30 MG PO CAPS: 30.0000 mg | ORAL_CAPSULE | Freq: Every evening | ORAL | Status: DC | PRN

## 2011-01-18 NOTE — Telephone Encounter (Signed)
Refill for 6 months. 

## 2011-01-18 NOTE — Telephone Encounter (Signed)
Received refill request from pharmacy for Temazepam 30mg  1 tablet qhs, #30. Last filled 08/04/10. Pt last seen 05/27/10. Please advise.

## 2011-01-31 ENCOUNTER — Ambulatory Visit (INDEPENDENT_AMBULATORY_CARE_PROVIDER_SITE_OTHER): Payer: BC Managed Care – PPO | Admitting: Family Medicine

## 2011-01-31 DIAGNOSIS — E538 Deficiency of other specified B group vitamins: Secondary | ICD-10-CM

## 2011-01-31 MED ORDER — CYANOCOBALAMIN 1000 MCG/ML IJ SOLN
1000.0000 ug | Freq: Once | INTRAMUSCULAR | Status: AC
  Administered 2011-01-31: 1000 ug via INTRAMUSCULAR

## 2011-03-16 ENCOUNTER — Ambulatory Visit (INDEPENDENT_AMBULATORY_CARE_PROVIDER_SITE_OTHER): Payer: BC Managed Care – PPO | Admitting: Family Medicine

## 2011-03-16 DIAGNOSIS — E539 Vitamin B deficiency, unspecified: Secondary | ICD-10-CM

## 2011-03-16 MED ORDER — CYANOCOBALAMIN 1000 MCG/ML IJ SOLN
1000.0000 ug | Freq: Once | INTRAMUSCULAR | Status: AC
  Administered 2011-03-16: 1000 ug via INTRAMUSCULAR

## 2011-03-27 ENCOUNTER — Ambulatory Visit (HOSPITAL_COMMUNITY)
Admission: RE | Admit: 2011-03-27 | Discharge: 2011-03-27 | Disposition: A | Discharge status: Home / Self Care | Payer: BC Managed Care – PPO | Source: Ambulatory Visit | Attending: Family Medicine | Admitting: Family Medicine

## 2011-03-27 ENCOUNTER — Other Ambulatory Visit: Payer: Self-pay | Admitting: Family Medicine

## 2011-03-27 DIAGNOSIS — R109 Unspecified abdominal pain: Secondary | ICD-10-CM | POA: Insufficient documentation

## 2011-03-27 DIAGNOSIS — M8560 Other cyst of bone, unspecified site: Secondary | ICD-10-CM | POA: Insufficient documentation

## 2011-03-27 DIAGNOSIS — K429 Umbilical hernia without obstruction or gangrene: Secondary | ICD-10-CM | POA: Insufficient documentation

## 2011-03-27 DIAGNOSIS — I319 Disease of pericardium, unspecified: Secondary | ICD-10-CM | POA: Insufficient documentation

## 2011-05-10 ENCOUNTER — Ambulatory Visit (INDEPENDENT_AMBULATORY_CARE_PROVIDER_SITE_OTHER): Payer: BC Managed Care – PPO | Admitting: Family Medicine

## 2011-05-10 DIAGNOSIS — E539 Vitamin B deficiency, unspecified: Secondary | ICD-10-CM

## 2011-05-10 MED ORDER — CYANOCOBALAMIN 1000 MCG/ML IJ SOLN
1000.0000 ug | Freq: Once | INTRAMUSCULAR | Status: AC
  Administered 2011-05-10: 1000 ug via INTRAMUSCULAR

## 2011-07-01 ENCOUNTER — Telehealth: Payer: Self-pay | Admitting: Family Medicine

## 2011-07-01 MED ORDER — TEMAZEPAM 30 MG PO CAPS: 30.0000 mg | ORAL_CAPSULE | Freq: Every evening | ORAL | Status: DC | PRN

## 2011-07-01 MED ORDER — HYDROCODONE-ACETAMINOPHEN 5-500 MG PO TABS: 1.0000 | ORAL_TABLET | Freq: Four times a day (QID) | ORAL | Status: DC | PRN

## 2011-07-01 NOTE — Telephone Encounter (Signed)
Refill request for Temazepam 30 mg take 1 po qhs and pt last here on 09/22/10.

## 2011-07-01 NOTE — Telephone Encounter (Signed)
Scripts called in

## 2011-07-01 NOTE — Telephone Encounter (Signed)
Call in Vicodin #60 with 5 rf, also Temazepam #30 with 5 rf

## 2011-07-01 NOTE — Telephone Encounter (Signed)
Refill request for Vicodin 5-500 mg take 1 po q6hrs prn and pt last here on 09/22/10.

## 2011-08-29 ENCOUNTER — Telehealth: Payer: Self-pay | Admitting: Family Medicine

## 2011-08-29 NOTE — Telephone Encounter (Signed)
NO refills, she just got #60 with 5 rf last December

## 2011-08-29 NOTE — Telephone Encounter (Signed)
Refill request for Hydrocodon-Acetaminophen 5-500 mg take 1 po q6hrs prn and pt last here on 09/22/10.

## 2011-08-29 NOTE — Telephone Encounter (Signed)
Spoke with pt

## 2011-09-27 ENCOUNTER — Ambulatory Visit: Payer: BC Managed Care – PPO

## 2011-09-27 ENCOUNTER — Ambulatory Visit (INDEPENDENT_AMBULATORY_CARE_PROVIDER_SITE_OTHER): Payer: BC Managed Care – PPO | Admitting: Family Medicine

## 2011-09-27 DIAGNOSIS — E538 Deficiency of other specified B group vitamins: Secondary | ICD-10-CM

## 2011-09-27 MED ORDER — CYANOCOBALAMIN 1000 MCG/ML IJ SOLN
1000.0000 ug | Freq: Once | INTRAMUSCULAR | Status: AC
  Administered 2011-09-27: 1000 ug via INTRAMUSCULAR

## 2011-10-17 ENCOUNTER — Ambulatory Visit: Payer: BC Managed Care – PPO | Admitting: Physician Assistant

## 2011-10-27 ENCOUNTER — Ambulatory Visit: Payer: BC Managed Care – PPO | Admitting: Family Medicine

## 2011-11-29 ENCOUNTER — Ambulatory Visit (INDEPENDENT_AMBULATORY_CARE_PROVIDER_SITE_OTHER): Payer: BC Managed Care – PPO | Admitting: Family Medicine

## 2011-11-29 DIAGNOSIS — E539 Vitamin B deficiency, unspecified: Secondary | ICD-10-CM

## 2011-11-29 MED ORDER — CYANOCOBALAMIN 1000 MCG/ML IJ SOLN
1000.0000 ug | Freq: Once | INTRAMUSCULAR | Status: AC
  Administered 2011-11-29: 1000 ug via INTRAMUSCULAR

## 2011-12-13 ENCOUNTER — Ambulatory Visit (INDEPENDENT_AMBULATORY_CARE_PROVIDER_SITE_OTHER): Payer: BC Managed Care – PPO | Admitting: Family Medicine

## 2011-12-13 ENCOUNTER — Telehealth: Payer: Self-pay | Admitting: Family Medicine

## 2011-12-13 VITALS — BP 96/62 | HR 63 | Temp 98.2°F | Resp 16 | Ht 61.75 in | Wt 92.8 lb

## 2011-12-13 DIAGNOSIS — F329 Major depressive disorder, single episode, unspecified: Secondary | ICD-10-CM

## 2011-12-13 DIAGNOSIS — F5000 Anorexia nervosa, unspecified: Secondary | ICD-10-CM

## 2011-12-13 DIAGNOSIS — F32A Depression, unspecified: Secondary | ICD-10-CM

## 2011-12-13 DIAGNOSIS — F429 Obsessive-compulsive disorder, unspecified: Secondary | ICD-10-CM

## 2011-12-13 DIAGNOSIS — F3289 Other specified depressive episodes: Secondary | ICD-10-CM

## 2011-12-13 MED ORDER — SERTRALINE HCL 25 MG PO TABS: ORAL_TABLET | ORAL | Status: DC

## 2011-12-13 NOTE — Progress Notes (Signed)
Patient Name: Emma Palmer Date of Birth: January 04, 1966 Medical Record Number: [id number] Gender: female Date of Encounter: 12/13/2011  History of Present Illness:  Emma Palmer is a 46 y.o. very pleasant female patient who presents with the following:  History of eating disorder and very low weight.   She is here to day to discuss ?anxiety.  She feels that "every day is a burden" and that she is anxious and fearful.  She also noted trouble with sleeping, and feels "nervous in my stomach."  Ssometimes I over-exercise and stay out in the sun too long. It's like an obsession, laying out in the sun."  Admits to sunbathing up to 5 hours daily.  Uses a stool softener and does have a BM every day.    "My main issue to day is anxiety, lack of sleep and depression."  She did start back in counseling just last week.  She is being seen once a week.   She saw a psychiatrist a long time ago, but is not under care currently.    She has been feeling sad.  Feels anxious and amotivated.  Has a hard time being indoors.  She notes increased symptoms for about 6 months.    She has been exercising for 2 hours a day.  She denies any recent vomiting, and admits that she has not been eating much.  She last saw Dr. Alanda Amass about 6 months ago. She reports that she had a good visit, and that she will need to follow- up every 3 months or so.  Patient Active Problem List  Diagnoses  . HYPERLIPIDEMIA  . ANXIETY STATE, UNSPECIFIED  . DEPRESSION  . ACUTE GINGIVITIS PLAQUE INDUCED  . ARTHRITIS, RIGHT HIP  . OSTEOPOROSIS  . SOLITARY BONE CYST   Past Medical History  Diagnosis Date  . Depression   . Hyperlipidemia   . OP (osteoporosis) 10-24-06    dexa   . Bulimia   . Headache   . GERD (gastroesophageal reflux disease)   . Hip dysplasia     left - dr supple  . Intraocular pressure increase     dr Hazle Quant  . Nephrolithiasis     hx, dr Aldean Ast  . H. pylori infection     + serolgies  . Anxiety   . Glaucoma     Past Surgical History  Procedure Date  . Esophagogastroduodenoscopy 05-06-05    dr Leone Payor -normal including duodenal bxs  . Lithotripsy     stent inserted   History  Substance Use Topics  . Smoking status: Current Everyday Smoker -- 0.5 packs/day    Types: Cigarettes  . Smokeless tobacco: Never Used  . Alcohol Use: No   Family History  Problem Relation Age of Onset  . Heart attack Father     female, 1st degree, less 50  . Hyperlipidemia Mother     father  . Hypertension Father   . Stroke Mother     M, 1st degree, less 50  . Colon cancer Neg Hx    No Known Allergies  Medication list has been reviewed and updated.  Review of Systems: As per HPI- otherwise negative.  Physical Examination: Filed Vitals:   12/13/11 0812  BP: 96/62  Pulse: 63  Temp: 98.2 F (36.8 C)  TempSrc: Oral  Resp: 16  Height: 5' 1.75" (1.568 m)  Weight: 92 lb 12.8 oz (42.094 kg)  SpO2: 100%    Body mass index is 17.11 kg/(m^2).  GEN: WDWN, NAD, Non-toxic, A &  O x 3, very thin and extremely tan HEENT: Atraumatic, Normocephalic. Neck supple. No masses, No LAD. Ears and Nose: No external deformity. CV: RRR, No M/G/R. No JVD. No thrill. No extra heart sounds. PULM: CTA B, no wheezes, crackles, rhonchi. No retractions. No resp. distress. No accessory muscle use. ABD: S, NT, ND, +BS. No rebound. No HSM. EXTR: No c/c/e NEURO Normal gait.  PSYCH: Normally interactive. Conversant. Not depressed or anxious appearing.  Calm demeanor.    Assessment and Plan: 1. Depression  sertraline (ZOLOFT) 25 MG tablet  2. OCD (obsessive compulsive disorder)    3. Anorexia nervosa     Discussed the obsessive nature of her eating disorder, exercising and tanning.  She also suffers from depression.  She is interested in trying drug therapy to see if we can improve these symptoms.  We will start on zoloft 25, 1/2 tab daily for 2 weeks, then try full tab.  Encouraged her to cut back on her exercise to max 30  minutes daily, and to stop tanning.  She will work on these things and call me with an update in the next couple of weeks- Sooner if worse.

## 2011-12-13 NOTE — Telephone Encounter (Signed)
Refill request for Temazepam 30 mg take 1 po qhs.

## 2011-12-14 MED ORDER — TEMAZEPAM 30 MG PO CAPS: 30.0000 mg | ORAL_CAPSULE | Freq: Every evening | ORAL | Status: DC | PRN

## 2011-12-14 NOTE — Telephone Encounter (Signed)
I called in script 

## 2011-12-14 NOTE — Telephone Encounter (Signed)
Call in #30 with 5 rf 

## 2012-01-09 ENCOUNTER — Telehealth: Payer: Self-pay

## 2012-01-09 NOTE — Telephone Encounter (Signed)
Pt states Zoloft is not working. States it makes her not want to do anything. Thinks she needs med for for depression than OCD.

## 2012-01-10 NOTE — Telephone Encounter (Signed)
Zoloft is indicated in the treatment of depression, but if she is unhappy with the medication and wants to switch she should come in for an office visit to discuss her options.

## 2012-01-11 NOTE — Telephone Encounter (Signed)
LMOM to CB. 

## 2012-01-12 NOTE — Telephone Encounter (Signed)
LMOM to RTC to discuss options

## 2012-02-21 ENCOUNTER — Encounter: Payer: Self-pay | Admitting: Family Medicine

## 2012-02-28 ENCOUNTER — Encounter: Payer: Self-pay | Admitting: Family Medicine

## 2012-04-19 ENCOUNTER — Other Ambulatory Visit: Payer: Self-pay | Admitting: Family Medicine

## 2012-04-19 NOTE — Telephone Encounter (Signed)
She needs an OV first

## 2012-05-02 ENCOUNTER — Encounter: Payer: Self-pay | Admitting: Family Medicine

## 2012-05-02 ENCOUNTER — Ambulatory Visit (INDEPENDENT_AMBULATORY_CARE_PROVIDER_SITE_OTHER): Payer: BC Managed Care – PPO | Admitting: Family Medicine

## 2012-05-02 VITALS — BP 96/58 | HR 66 | Temp 97.8°F | Wt 93.0 lb

## 2012-05-02 DIAGNOSIS — M545 Low back pain, unspecified: Secondary | ICD-10-CM

## 2012-05-02 DIAGNOSIS — F3289 Other specified depressive episodes: Secondary | ICD-10-CM

## 2012-05-02 DIAGNOSIS — R51 Headache: Secondary | ICD-10-CM

## 2012-05-02 DIAGNOSIS — F329 Major depressive disorder, single episode, unspecified: Secondary | ICD-10-CM

## 2012-05-02 DIAGNOSIS — E539 Vitamin B deficiency, unspecified: Secondary | ICD-10-CM

## 2012-05-02 MED ORDER — HYDROCODONE-ACETAMINOPHEN 5-500 MG PO TABS: 1.0000 | ORAL_TABLET | Freq: Four times a day (QID) | ORAL | Status: DC | PRN

## 2012-05-02 MED ORDER — TEMAZEPAM 30 MG PO CAPS: 30.0000 mg | ORAL_CAPSULE | Freq: Every evening | ORAL | Status: DC | PRN

## 2012-05-02 MED ORDER — CYANOCOBALAMIN 1000 MCG/ML IJ SOLN
1000.0000 ug | Freq: Once | INTRAMUSCULAR | Status: AC
  Administered 2012-05-02: 1000 ug via INTRAMUSCULAR

## 2012-05-02 NOTE — Progress Notes (Signed)
  Subjective:    Patient ID: Emma Palmer, female    DOB: [birth year], 46 y.o.   MRN: [id number]  HPI Here for several things. She needs refills on pain meds and sleep meds. They continue to work well. Her migraines are fairly well controlled. She ha had some intermittent back pains, both in the middle of the back and in the lower back. She works out at [employer] and this does well, but sometimes after mowing her grass the back may hurt. Advil helps this.    Review of Systems  Constitutional: Negative.   Respiratory: Negative.   Cardiovascular: Negative.   Musculoskeletal: Positive for back pain.  Neurological: Positive for headaches.       Objective:   Physical Exam  Constitutional: She is oriented to person, place, and time. She appears well-developed and well-nourished.  Cardiovascular: Normal rate, regular rhythm, normal heart sounds and intact distal pulses.   Pulmonary/Chest: Effort normal and breath sounds normal.  Musculoskeletal: Normal range of motion. She exhibits no edema and no tenderness.  Neurological: She is alert and oriented to person, place, and time.          Assessment & Plan:  Muscular back pain. Use Advil prn. Refilled meds. Given a B12 shot.

## 2012-05-02 NOTE — Addendum Note (Signed)
Addended by: Aniceto Boss A on: 05/02/2012 02:23 PM   Modules accepted: Orders

## 2012-05-30 ENCOUNTER — Other Ambulatory Visit: Payer: Self-pay | Admitting: Family Medicine

## 2012-05-30 NOTE — Telephone Encounter (Signed)
NO, we gave her a 6 months supply last month

## 2012-07-27 ENCOUNTER — Telehealth: Payer: Self-pay | Admitting: Family Medicine

## 2012-07-27 MED ORDER — MAGIC MOUTHWASH: 5.0000 mL | ORAL | Status: DC | PRN

## 2012-07-27 NOTE — Telephone Encounter (Signed)
Call in Magic Mouthwash to swish 5 ml in the mouth q 4 hours prn, 300 ml with 5 rf

## 2012-07-27 NOTE — Telephone Encounter (Signed)
Patient Information:  Caller Name: Mariaha  Phone: [phone]  Patient: Emma Palmer, Emma Palmer  Gender: Female  DOB: 1966/05/20  Age: 47 Years  PCP: Gershon Crane [facility])  Pregnant: No  Office Follow Up:  Does the office need to follow up with this patient?: Yes  Instructions For The Office: OFFICE PLEASE FOLLOW UP WITH PT REGARDING RX REQUEST  RN Note:  pt reports that she has white patches on her tongue; RN triaged patient using [facility] Guidelines for Mouth Lesions.  Home Care Disposition for 'Painful pale yellow or white spots on the lining of the mouth, gums or tongue'.  RN gave home care advice per protocol.  Pt would still like to have Magic Mouthwash called in for her.  Pt states she thinks it was called in for her in 2011 (RN could not find medication in EPIC)  Symptoms  Reason For Call & Symptoms: pt reports that she has a "dry mouth" and her tongue is irritated.  Pt states she normally usually magic mouthwash and that helps her  Reviewed Health History In EMR: Yes  Reviewed Medications In EMR: Yes  Reviewed Allergies In EMR: Yes  Reviewed Surgeries / Procedures: Yes  Date of Onset of Symptoms: 07/26/2012  Treatments Tried: Listerine (made it worse)  Treatments Tried Worked: No OB / GYN:  LMP: 07/20/2012  Guideline(s) Used:  No Protocol Available - Sick Adult  Disposition Per Guideline:   Home Care  Reason For Disposition Reached:   Patient's symptoms are safe to treat at home per nursing judgment  Advice Given:  N/A  Patient Refused Recommendation:  Patient Requests Prescription  Pt is requesting magic mouthwash RX.  Pt uses [facility]  (Store # [phone]) [facility] [place] [phone])

## 2012-07-27 NOTE — Telephone Encounter (Signed)
I sent script e-scribe and spoke with pt. 

## 2012-07-30 ENCOUNTER — Telehealth: Payer: Self-pay | Admitting: Family Medicine

## 2012-07-30 NOTE — Telephone Encounter (Signed)
[facility] Triage Call Report Triage Record Num: [id number] Operator: Albertine Grates Patient Name: Emma Palmer Call Date & Time: 07/27/2012 6:09:50PM Patient Phone: [phone] PCP: Patient Gender: Female PCP Fax : Patient DOB: 1966-01-31 [facility] Name: [facility] Reason for Call: Caller: Vaudine/Patient; PCP: Gershon Crane (Family Practice); CB#: [phone]; Has received call from pharmacy 1-10 stating Magic Mouthwash could not be filled as MD did not leave formula. Per EPIC, Magic Mouth wash take 5ml by mouth every 4hrs as needed was sent. [facility] [address] [phone] notified and advised they need exact formulation as to what needs to be added to medicine. Dr. Tawanna Cooler notified and advised Nystatin 100,000unit/ml, Benadryl 12.5mg /52ml, Hydrocortisone 20mg  mixed 1:1:1 4 ounce bottle. [facility], Kathryne Sharper. Protocol(s) Used: Office Note Recommended Outcome per Protocol: Information Noted and Sent to Office Reason for Outcome: Caller information to office Care Advice: ~ 01/

## 2012-07-31 ENCOUNTER — Telehealth: Payer: Self-pay | Admitting: Family Medicine

## 2012-07-31 NOTE — Telephone Encounter (Signed)
Per pharm vicodin 5-500mg  no longer made.

## 2012-08-01 MED ORDER — HYDROCODONE-ACETAMINOPHEN 5-325 MG PO TABS: 1.0000 | ORAL_TABLET | Freq: Four times a day (QID) | ORAL | Status: DC | PRN

## 2012-08-01 NOTE — Telephone Encounter (Signed)
Cancel this and switch to Vicodin 5/325. Call in #60 with 5 rf

## 2012-08-01 NOTE — Telephone Encounter (Signed)
I called in script 

## 2012-08-24 ENCOUNTER — Encounter: Payer: Self-pay | Admitting: Family Medicine

## 2012-08-24 ENCOUNTER — Ambulatory Visit (INDEPENDENT_AMBULATORY_CARE_PROVIDER_SITE_OTHER): Payer: BC Managed Care – PPO | Admitting: Family Medicine

## 2012-08-24 ENCOUNTER — Other Ambulatory Visit: Payer: Self-pay | Admitting: Family Medicine

## 2012-08-24 VITALS — BP 96/50 | HR 98 | Temp 98.1°F | Wt 94.0 lb

## 2012-08-24 DIAGNOSIS — K121 Other forms of stomatitis: Secondary | ICD-10-CM

## 2012-08-24 DIAGNOSIS — E538 Deficiency of other specified B group vitamins: Secondary | ICD-10-CM

## 2012-08-24 LAB — BASIC METABOLIC PANEL
BUN: 6 mg/dL (ref 6–23)
CO2: 28 mEq/L (ref 19–32)
Calcium: 9.1 mg/dL (ref 8.4–10.5)
Glucose, Bld: 75 mg/dL (ref 70–99)
Sodium: 139 mEq/L (ref 135–145)

## 2012-08-24 LAB — HEPATIC FUNCTION PANEL
AST: 20 U/L (ref 0–37)
Albumin: 4.2 g/dL (ref 3.5–5.2)
Alkaline Phosphatase: 31 U/L — ABNORMAL LOW (ref 39–117)
Total Protein: 6.7 g/dL (ref 6.0–8.3)

## 2012-08-24 LAB — CBC WITH DIFFERENTIAL/PLATELET
Basophils Absolute: 0 10*3/uL (ref 0.0–0.1)
Eosinophils Absolute: 0.3 10*3/uL (ref 0.0–0.7)
Lymphocytes Relative: 45.4 % (ref 12.0–46.0)
MCHC: 33.2 g/dL (ref 30.0–36.0)
Monocytes Relative: 8.2 % (ref 3.0–12.0)
Neutrophils Relative %: 40.2 % — ABNORMAL LOW (ref 43.0–77.0)
RBC: 3.91 Mil/uL (ref 3.87–5.11)
RDW: 15.6 % — ABNORMAL HIGH (ref 11.5–14.6)

## 2012-08-24 LAB — TSH: TSH: 1.93 u[IU]/mL (ref 0.35–5.50)

## 2012-08-24 MED ORDER — CYANOCOBALAMIN 1000 MCG/ML IJ SOLN
1000.0000 ug | Freq: Once | INTRAMUSCULAR | Status: AC
  Administered 2012-08-24: 1000 ug via INTRAMUSCULAR

## 2012-08-24 NOTE — Addendum Note (Signed)
Addended by: Aniceto Boss A on: 08/24/2012 01:55 PM   Modules accepted: Orders

## 2012-08-24 NOTE — Progress Notes (Signed)
  Subjective:    Patient ID: Emma Palmer, female    DOB: Oct 22, 1965, 47 y.o.   MRN: [id number]  HPI Here to look at a large blister on the roof of her mouth which appeared several days ago. It broke open yesterday and a lot of bloody fluid came out. Today it is flat but is sore. She is eating soup and ice cream and avoiding hard foods. She often gets sore spots in her mouth and on the tongue. She has B12 deficiency and is supposed to be getting monthly shots. However she has not come in for a shot in over 5 months, and she has not had a blood level checked in several years.    Review of Systems  Constitutional: Negative.   HENT: Positive for mouth sores. Negative for sore throat and trouble swallowing.   Eyes: Negative.   Respiratory: Negative.        Objective:   Physical Exam  Constitutional: She appears well-developed and well-nourished.  HENT:  Right Ear: External ear normal.  Left Ear: External ear normal.  Nose: Nose normal.       There is a large 1.0 cm blister on the soft palate which is flat, slightly tender. No active bleeding or purulence seen.   Eyes: Conjunctivae normal are normal.  Neck: Normal range of motion. No thyromegaly present.  Lymphadenopathy:    She has no cervical adenopathy.          Assessment & Plan:  The blister itself should heal over the next week or two. I think her oral lesions are resulting from B vitamin deficiencies, primarily B12. Given a B12 shot today, and we will get back on a weekly regimen for the time being. Get labs today including a B12 level and a CBC.

## 2012-08-28 ENCOUNTER — Encounter: Payer: Self-pay | Admitting: Family Medicine

## 2012-08-28 NOTE — Progress Notes (Signed)
Quick Note:  Pt does not have a phone number listed, I put a copy of results in mail. ______

## 2012-08-28 NOTE — Progress Notes (Signed)
Quick Note:  Pt does not have a phone number listed, I put a copy of results in mail. ______ 

## 2012-09-19 ENCOUNTER — Ambulatory Visit (INDEPENDENT_AMBULATORY_CARE_PROVIDER_SITE_OTHER): Payer: BC Managed Care – PPO | Admitting: Family Medicine

## 2012-09-19 ENCOUNTER — Encounter: Payer: Self-pay | Admitting: Family Medicine

## 2012-09-19 VITALS — BP 90/60 | HR 71 | Temp 98.2°F | Wt 92.0 lb

## 2012-09-19 DIAGNOSIS — E538 Deficiency of other specified B group vitamins: Secondary | ICD-10-CM

## 2012-09-19 DIAGNOSIS — K121 Other forms of stomatitis: Secondary | ICD-10-CM

## 2012-09-19 LAB — LIPID PANEL
HDL: 37.5 mg/dL — ABNORMAL LOW (ref >39.00)
Triglycerides: 173 mg/dL — ABNORMAL HIGH (ref 0.0–149.0)
VLDL: 34.6 mg/dL (ref 0.0–40.0)

## 2012-09-19 MED ORDER — MAGIC MOUTHWASH: 5.0000 mL | ORAL | Status: DC | PRN

## 2012-09-19 MED ORDER — CYANOCOBALAMIN 1000 MCG/ML IJ SOLN
1000.0000 ug | Freq: Once | INTRAMUSCULAR | Status: AC
  Administered 2012-09-19: 1000 ug via INTRAMUSCULAR

## 2012-09-19 NOTE — Addendum Note (Signed)
Addended by: Aniceto Boss A on: 09/19/2012 12:21 PM   Modules accepted: Orders

## 2012-09-19 NOTE — Progress Notes (Signed)
  Subjective:    Patient ID: Emma Palmer, female    DOB: [birth year], 47 y.o.   MRN: [id number]  HPI Here to follow up on B121 deficiency and stomatitis. She had a level checked here last month but this was not accurate since she had gotten a shot just before that draw. Her mouth is still irritated.    Review of Systems  Constitutional: Negative.   Respiratory: Negative.   Cardiovascular: Negative.        Objective:   Physical Exam  Constitutional: She appears well-developed and well-nourished.  HENT:  The entire OP is mildly red and inflamed.   Cardiovascular: Normal rate, regular rhythm, normal heart sounds and intact distal pulses.   Pulmonary/Chest: Effort normal and breath sounds normal.          Assessment & Plan:  Check a level today, and then we will give her a shot after that. Her stomatitis should resolve soon

## 2012-09-20 NOTE — Progress Notes (Signed)
Quick Note:  I left voice message with results and put a copy in mail. ______

## 2012-10-15 ENCOUNTER — Ambulatory Visit (INDEPENDENT_AMBULATORY_CARE_PROVIDER_SITE_OTHER): Payer: BC Managed Care – PPO | Admitting: Family Medicine

## 2012-10-15 DIAGNOSIS — E539 Vitamin B deficiency, unspecified: Secondary | ICD-10-CM

## 2012-10-15 MED ORDER — CYANOCOBALAMIN 1000 MCG/ML IJ SOLN
1000.0000 ug | Freq: Once | INTRAMUSCULAR | Status: AC
  Administered 2012-10-15: 1000 ug via INTRAMUSCULAR

## 2012-11-13 ENCOUNTER — Other Ambulatory Visit: Payer: Self-pay | Admitting: Family Medicine

## 2012-11-14 NOTE — Telephone Encounter (Signed)
Okay for another 6 months 

## 2012-11-15 ENCOUNTER — Ambulatory Visit (INDEPENDENT_AMBULATORY_CARE_PROVIDER_SITE_OTHER): Payer: BC Managed Care – PPO | Admitting: Family Medicine

## 2012-11-15 ENCOUNTER — Encounter: Payer: Self-pay | Admitting: Family Medicine

## 2012-11-15 VITALS — BP 90/60 | HR 65 | Temp 98.1°F | Wt 93.0 lb

## 2012-11-15 DIAGNOSIS — F329 Major depressive disorder, single episode, unspecified: Secondary | ICD-10-CM

## 2012-11-15 DIAGNOSIS — F3289 Other specified depressive episodes: Secondary | ICD-10-CM

## 2012-11-15 DIAGNOSIS — E538 Deficiency of other specified B group vitamins: Secondary | ICD-10-CM

## 2012-11-15 MED ORDER — CYANOCOBALAMIN 1000 MCG/ML IJ SOLN
1000.0000 ug | Freq: Once | INTRAMUSCULAR | Status: AC
  Administered 2012-11-15: 1000 ug via INTRAMUSCULAR

## 2012-11-15 MED ORDER — BUPROPION HCL ER (XL) 150 MG PO TB24: 150.0000 mg | ORAL_TABLET | Freq: Every day | ORAL | Status: DC

## 2012-11-15 NOTE — Progress Notes (Signed)
  Subjective:    Patient ID: Emma Palmer, female    DOB: 02-16-1966, 47 y.o.   MRN: [id number]  HPI Here to discuss depression. She has dealt with this for years, and she did well on Prozac for years. Then it seemed to stop working for her and we discontinued it. She has tried Celexa and Cymbalta over the years with poor results. She has been on no depression meds now for several years. She describes feeling sad and hopeless all the time now. She is not working and she feels isolated. She has no close friends, and the [address] church she used to attend became very uncomfortable for her. She has a poor relationship with this pastor and is not comfortable talking to her. She has visited another a few times and greatly enjoyed it. She goes to her gym frequently but no one talks much when she is there. She denies any suicidal thoughts.    Review of Systems  Constitutional: Negative.   Psychiatric/Behavioral: Positive for dysphoric mood. Negative for confusion, decreased concentration and agitation. The patient is not nervous/anxious.        Objective:   Physical Exam  Constitutional: She appears well-developed and well-nourished.  Psychiatric: Her behavior is normal. Thought content normal.  Tearful, good eye contact          Assessment & Plan:  She is very depressed, and her social isolation is a big part of the problem. I urged her to become more involved with the new church she has visited, both in services and also in social activities. Try Wellbutrin XL 150 mg a day. Recheck in 3 weeks.

## 2012-11-15 NOTE — Addendum Note (Signed)
Addended by: Aniceto Boss A on: 11/15/2012 09:51 AM   Modules accepted: Orders

## 2012-12-26 ENCOUNTER — Ambulatory Visit (INDEPENDENT_AMBULATORY_CARE_PROVIDER_SITE_OTHER): Payer: BC Managed Care – PPO | Admitting: Family Medicine

## 2012-12-26 ENCOUNTER — Encounter: Payer: Self-pay | Admitting: Family Medicine

## 2012-12-26 VITALS — BP 90/58 | HR 61 | Temp 97.8°F | Wt 87.0 lb

## 2012-12-26 DIAGNOSIS — R630 Anorexia: Secondary | ICD-10-CM

## 2012-12-26 DIAGNOSIS — F3289 Other specified depressive episodes: Secondary | ICD-10-CM

## 2012-12-26 DIAGNOSIS — F329 Major depressive disorder, single episode, unspecified: Secondary | ICD-10-CM

## 2012-12-26 DIAGNOSIS — E538 Deficiency of other specified B group vitamins: Secondary | ICD-10-CM

## 2012-12-26 DIAGNOSIS — F411 Generalized anxiety disorder: Secondary | ICD-10-CM

## 2012-12-26 MED ORDER — CYANOCOBALAMIN 1000 MCG/ML IJ SOLN
1000.0000 ug | Freq: Once | INTRAMUSCULAR | Status: AC
  Administered 2012-12-26: 1000 ug via INTRAMUSCULAR

## 2012-12-26 NOTE — Addendum Note (Signed)
Addended by: Aniceto Boss A on: 12/26/2012 12:15 PM   Modules accepted: Orders

## 2012-12-26 NOTE — Progress Notes (Signed)
  Subjective:    Patient ID: Emma Palmer, female    DOB: May 03, 1966, 47 y.o.   MRN: [id number]  HPI Here to follow up anxiety and depression. At our last visit we talked about trying Wellbutrin, but she has not started this yet. She has been struggling with her anorexia lately and she is worried this med could make that worse. She has not seen a therapist in several years.    Review of Systems  Constitutional: Negative.   Psychiatric/Behavioral: Positive for dysphoric mood. Negative for confusion, decreased concentration and agitation. The patient is nervous/anxious.        Objective:   Physical Exam  Constitutional: She is oriented to person, place, and time. She appears well-developed and well-nourished.  Neurological: She is alert and oriented to person, place, and time.  Psychiatric: She has a normal mood and affect. Her behavior is normal. Thought content normal.          Assessment & Plan:  I agreed with her that Wellbutrin would not be a good idea to take right now, so we will not start it. Rather than medications I think she would benefit from psychotherapy and she agreed. I gave her info about [employer].

## 2013-01-10 ENCOUNTER — Other Ambulatory Visit: Payer: Self-pay | Admitting: Family Medicine

## 2013-01-11 ENCOUNTER — Ambulatory Visit (INDEPENDENT_AMBULATORY_CARE_PROVIDER_SITE_OTHER): Payer: BC Managed Care – PPO | Admitting: Family Medicine

## 2013-01-11 ENCOUNTER — Ambulatory Visit: Payer: BC Managed Care – PPO | Admitting: Family Medicine

## 2013-01-11 ENCOUNTER — Encounter: Payer: Self-pay | Admitting: Family Medicine

## 2013-01-11 VITALS — BP 90/54 | HR 74 | Temp 97.7°F | Wt 86.0 lb

## 2013-01-11 DIAGNOSIS — L259 Unspecified contact dermatitis, unspecified cause: Secondary | ICD-10-CM

## 2013-01-11 DIAGNOSIS — E539 Vitamin B deficiency, unspecified: Secondary | ICD-10-CM

## 2013-01-11 DIAGNOSIS — J069 Acute upper respiratory infection, unspecified: Secondary | ICD-10-CM

## 2013-01-11 MED ORDER — CYANOCOBALAMIN 1000 MCG/ML IJ SOLN
1000.0000 ug | Freq: Once | INTRAMUSCULAR | Status: AC
  Administered 2013-01-11: 1000 ug via INTRAMUSCULAR

## 2013-01-11 NOTE — Progress Notes (Signed)
  Subjective:    Patient ID: Emma Palmer, female    DOB: Sep 07, 1965, 47 y.o.   MRN: [id number]  HPI Here for 2 things. First she has had a diffuse rash for 4 days which is slightly itchy. Also she has had 2 days of a mild ST. No fever or any other sx.    Review of Systems  Constitutional: Negative.   HENT: Positive for sore throat. Negative for congestion, rhinorrhea, sneezing, postnasal drip and sinus pressure.   Eyes: Negative.   Respiratory: Negative.   Skin: Positive for rash.       Objective:   Physical Exam  Constitutional: She appears well-developed and well-nourished. No distress.  HENT:  Right Ear: External ear normal.  Left Ear: External ear normal.  Nose: Nose normal.  Mouth/Throat: No oropharyngeal exudate.  Mild erythema and swelling of the uvula   Neck: Neck supple. No thyromegaly present.  Lymphadenopathy:    She has no cervical adenopathy.  Skin:  Scattered patches of red papules and vesicles on the arms, legs, and trunk          Assessment & Plan:  Use moisturizing lotion with aloe on the skin. The viral URI will resolve soon. Try Advil prn

## 2013-01-11 NOTE — Telephone Encounter (Signed)
Call in #60 with 5 rf 

## 2013-02-04 ENCOUNTER — Ambulatory Visit (INDEPENDENT_AMBULATORY_CARE_PROVIDER_SITE_OTHER): Payer: BC Managed Care – PPO | Admitting: Family Medicine

## 2013-02-04 ENCOUNTER — Ambulatory Visit: Payer: BC Managed Care – PPO | Admitting: Family Medicine

## 2013-02-04 ENCOUNTER — Encounter: Payer: Self-pay | Admitting: Family Medicine

## 2013-02-04 VITALS — BP 90/56 | HR 61 | Temp 97.7°F | Wt 88.0 lb

## 2013-02-04 DIAGNOSIS — E538 Deficiency of other specified B group vitamins: Secondary | ICD-10-CM

## 2013-02-04 DIAGNOSIS — M4120 Other idiopathic scoliosis, site unspecified: Secondary | ICD-10-CM

## 2013-02-04 MED ORDER — FLUOXETINE HCL 10 MG PO CAPS: 10.0000 mg | ORAL_CAPSULE | Freq: Every day | ORAL | Status: DC

## 2013-02-04 MED ORDER — CYANOCOBALAMIN 1000 MCG/ML IJ SOLN
1000.0000 ug | Freq: Once | INTRAMUSCULAR | Status: AC
  Administered 2013-02-04: 1000 ug via INTRAMUSCULAR

## 2013-02-04 NOTE — Progress Notes (Signed)
  Subjective:    Patient ID: Emma Palmer, female    DOB: 1966/06/07, 47 y.o.   MRN: [id number]  HPI Here to discuss frequent pain in the back, sometimes in the right middle back and sometimes in the lower back. She walks an hour on here treadmill 3 days a week and she does yoga. She takes Advil or Norco at times for this.    Review of Systems  Constitutional: Negative.   Musculoskeletal: Positive for back pain.       Objective:   Physical Exam  Constitutional: She appears well-developed and well-nourished.  Musculoskeletal:  There is mild lateral scoliosis of the thoracic and lumbar spines. Full ROM. She is tender over the right middle back and over the sacrum.           Assessment & Plan:  Try Aleve bid prn. Continue with yoga.

## 2013-02-05 ENCOUNTER — Ambulatory Visit (INDEPENDENT_AMBULATORY_CARE_PROVIDER_SITE_OTHER): Payer: BC Managed Care – PPO | Admitting: Family Medicine

## 2013-02-05 ENCOUNTER — Encounter: Payer: Self-pay | Admitting: Family Medicine

## 2013-02-05 VITALS — BP 86/60 | HR 55 | Temp 98.1°F | Resp 18 | Ht 60.5 in | Wt 89.0 lb

## 2013-02-05 DIAGNOSIS — R1032 Left lower quadrant pain: Secondary | ICD-10-CM

## 2013-02-05 DIAGNOSIS — N390 Urinary tract infection, site not specified: Secondary | ICD-10-CM

## 2013-02-05 DIAGNOSIS — R1031 Right lower quadrant pain: Secondary | ICD-10-CM

## 2013-02-05 LAB — POCT URINALYSIS DIPSTICK
Bilirubin, UA: NEGATIVE
Blood, UA: NEGATIVE
Glucose, UA: NEGATIVE
Ketones, UA: NEGATIVE
Leukocytes, UA: NEGATIVE
Nitrite, UA: NEGATIVE
Protein, UA: NEGATIVE
Spec Grav, UA: 1.01
Urobilinogen, UA: 0.2
pH, UA: 8

## 2013-02-05 LAB — POCT WET PREP WITH KOH
KOH Prep POC: NEGATIVE
WBC Wet Prep HPF POC: NEGATIVE
Yeast Wet Prep HPF POC: NEGATIVE

## 2013-02-05 LAB — POCT UA - MICROSCOPIC ONLY
Bacteria, U Microscopic: NEGATIVE
Casts, Ur, LPF, POC: NEGATIVE
Crystals, Ur, HPF, POC: NEGATIVE
Mucus, UA: NEGATIVE
RBC, urine, microscopic: NEGATIVE
WBC, Ur, HPF, POC: NEGATIVE
Yeast, UA: NEGATIVE

## 2013-02-05 LAB — POCT CBC
HCT, POC: 40.1 % (ref 37.7–47.9)
Hemoglobin: 12.8 g/dL (ref 12.2–16.2)
Lymph, poc: 2 (ref 0.6–3.4)
MCH, POC: 29.9 pg (ref 27–31.2)
MCHC: 31.9 g/dL (ref 31.8–35.4)
MCV: 93.7 fL (ref 80–97)
MPV: 7.2 fL (ref 0–99.8)
RBC: 4.28 M/uL (ref 4.04–5.48)
WBC: 4.5 10*3/uL — AB (ref 4.6–10.2)

## 2013-02-05 NOTE — Progress Notes (Addendum)
[facility] [address] [place] [address] [phone]  Date:  02/05/2013   Name:  Emma Palmer   DOB:  December 29, 1965   MRN:  [id number]  PCP:  Nelwyn Salisbury, MD    Chief Complaint: Urinary Tract Infection   History of Present Illness:  Emma Palmer is a 47 y.o. very pleasant female patient who presents with the following:  She has noted mild UTI sx for the last couple of weeks, but "did not think too much of it until last night."  Her sx seemed to worsen last night.    She has noted urinary frequency, and some dysuria.  She notes pressure over her bladder.  No hematuria.  She does not get menses- has not in some time.  She has not had sex in about 10 years.   No blood in her urine.   No fever or chills.    She also notes that she has had some "pressure" in her intestines for a couple of months.    Emma Palmer's weight is down below 90 lbs again.  She is aware that her eating disorder issues continue.  She is interested in getting help for this again, but is not sure where to start.  She did just start on Prozac for depression- just got this rx yesterday   Patient Active Problem List   Diagnosis Date Noted  . ANXIETY STATE, UNSPECIFIED 04/09/2010  . ACUTE GINGIVITIS PLAQUE INDUCED 04/09/2010  . SOLITARY BONE CYST 08/31/2007  . HYPERLIPIDEMIA 11/15/2006  . DEPRESSION 11/15/2006  . ARTHRITIS, RIGHT HIP 11/15/2006  . OSTEOPOROSIS 11/15/2006    Past Medical History  Diagnosis Date  . Depression   . Hyperlipidemia   . OP (osteoporosis) 10-24-06    dexa   . Bulimia   . Headache(784.0)   . GERD (gastroesophageal reflux disease)   . Hip dysplasia     left - dr supple  . Intraocular pressure increase     dr Hazle Quant  . Nephrolithiasis     hx, dr Aldean Ast  . H. pylori infection     + serolgies  . Anxiety   . Glaucoma   . B12 deficiency     Past Surgical History  Procedure Laterality Date  . Esophagogastroduodenoscopy  05-06-05    dr Leone Payor -normal  including duodenal bxs  . Lithotripsy      stent inserted    History  Substance Use Topics  . Smoking status: Current Every Day Smoker -- 0.50 packs/day    Types: Cigarettes  . Smokeless tobacco: Never Used     Comment: 1/2 pack or less  . Alcohol Use: No    Family History  Problem Relation Age of Onset  . Heart attack Father     female, 1st degree, less 50  . Hyperlipidemia Mother     father  . Hypertension Father   . Stroke Mother     M, 1st degree, less 50  . Colon cancer Neg Hx     Allergies  Allergen Reactions  . Doxycycline     Chest pains     Medication list has been reviewed and updated.  Current Outpatient Prescriptions on File Prior to Visit  Medication Sig Dispense Refill  . cyanocobalamin (,VITAMIN B-12,) 1000 MCG/ML injection Inject 1,000 mcg into the muscle every 14 (fourteen) days.       Marland Kitchen HYDROcodone-acetaminophen (NORCO/VICODIN) 5-325 MG per tablet TAKE 1 TABLET BY MOUTH EVERY 6 HOURS AS NEEDED FOR PAIN  60 tablet  5  . nystatin (MYCOSTATIN) 100000 UNIT/ML suspension SWISH ONE TEASPOONFUL EVERY 4 HOURS AS NEEDED  180 mL  0  . temazepam (RESTORIL) 30 MG capsule TAKE ONE CAPSULE AT BEDTIME AS NEEDED FOR SLEEP  30 capsule  5  . FLUoxetine (PROZAC) 10 MG capsule Take 1 capsule (10 mg total) by mouth daily.  30 capsule  11   No current facility-administered medications on file prior to visit.    Review of Systems:  As per HPI- otherwise negative.   Physical Examination: Filed Vitals:   02/05/13 0849  BP: 80/58  Pulse: 55  Temp: 98.1 F (36.7 C)  Resp: 18   Filed Vitals:   02/05/13 0849  Height: 5' 0.5" (1.537 m)  Weight: 89 lb (40.37 kg)   Body mass index is 17.09 kg/(m^2). Ideal Body Weight: Weight in (lb) to have BMI = 25: 129.9  GEN: WDWN, NAD, Non-toxic, A & O x 3, suffers from anorexia nervosa.   HEENT: Atraumatic, Normocephalic. Neck supple. No masses, No LAD. Ears and Nose: No external deformity. CV: RRR, No M/G/R. No JVD. No  thrill. No extra heart sounds. PULM: CTA B, no wheezes, crackles, rhonchi. No retractions. No resp. distress. No accessory muscle use. ABD: S, NT, ND, +BS. No rebound. No HSM.  Abdomen is benign EXTR: No c/c/e NEURO Normal gait.  PSYCH: Normally interactive. Conversant. Not depressed or anxious appearing.  Calm demeanor. GU: performed gentle bimanual exam and collected wet prep. No CMT, no masses or other abnormality  Results for orders placed in visit on 02/05/13  POCT UA - MICROSCOPIC ONLY      Result Value Range   WBC, Ur, HPF, POC neg     RBC, urine, microscopic neg     Bacteria, U Microscopic neg     Mucus, UA neg     Epithelial cells, urine per micros 0-1     Crystals, Ur, HPF, POC neg     Casts, Ur, LPF, POC neg     Yeast, UA neg    POCT URINALYSIS DIPSTICK      Result Value Range   Color, UA yellow     Clarity, UA clear     Glucose, UA neg     Bilirubin, UA neg     Ketones, UA neg     Spec Grav, UA 1.010     Blood, UA neg     pH, UA 8.0     Protein, UA neg     Urobilinogen, UA 0.2     Nitrite, UA neg     Leukocytes, UA Negative    POCT CBC      Result Value Range   WBC 4.5 (*) 4.6 - 10.2 K/uL   Lymph, poc 2.0  0.6 - 3.4   POC LYMPH PERCENT 45.0  10 - 50 %L   MID (cbc) 0.5  0 - 0.9   POC MID % 10.7  0 - 12 %M   POC Granulocyte 2.0  2 - 6.9   Granulocyte percent 44.3  37 - 80 %G   RBC 4.28  4.04 - 5.48 M/uL   Hemoglobin 12.8  12.2 - 16.2 g/dL   HCT, POC 40.9  81.1 - 47.9 %   MCV 93.7  80 - 97 fL   MCH, POC 29.9  27 - 31.2 pg   MCHC 31.9  31.8 - 35.4 g/dL   RDW, POC 91.4     Platelet Count, POC 373  142 -  424 K/uL   MPV 7.2  0 - 99.8 fL  POCT WET PREP WITH KOH      Result Value Range   Trichomonas, UA Negative     Clue Cells Wet Prep HPF POC 0-1     Epithelial Wet Prep HPF POC 3-16     Yeast Wet Prep HPF POC neg     Bacteria Wet Prep HPF POC trace     RBC Wet Prep HPF POC neg     WBC Wet Prep HPF POC neg     KOH Prep POC Negative       Assessment  and Plan: Urinary tract infection, site not specified - Plan: POCT UA - Microscopic Only, POCT urinalysis dipstick  Abdominal discomfort, bilateral lower quadrant - Plan: POCT CBC, POCT Wet Prep with KOH, Urine culture  Urine and wet prep are benign.  Await urine culture to ensure she doe snot have a UTI. However, at this time reassured her that a UTI seems unlikely.  Encouraged her to continue to work on getting help for her anorexia.  Advised her that I think her intestinal upset is likely related to her eating disorder.  Obtaining a more normal weight will help her overall health and well- being.    Signed Abbe Amsterdam, MD  7/23- received urine culture results- negative.  LMOM with her- insignificant growth.  Let me know if not feeing better

## 2013-02-05 NOTE — Patient Instructions (Addendum)
I will be in touch with your urine culture when it comes in.  Let me know if you are getting worse or having any other problems in the meantime.  Please do call your insurance company and ask them about counseling services.  Look around for a therapist who works with eating disorders.  If you need help please let me know.

## 2013-02-20 ENCOUNTER — Encounter: Payer: Self-pay | Admitting: Family Medicine

## 2013-02-20 ENCOUNTER — Ambulatory Visit (INDEPENDENT_AMBULATORY_CARE_PROVIDER_SITE_OTHER): Payer: BC Managed Care – PPO | Admitting: Family Medicine

## 2013-02-20 VITALS — BP 94/60 | HR 66 | Temp 98.1°F | Wt 86.0 lb

## 2013-02-20 DIAGNOSIS — E538 Deficiency of other specified B group vitamins: Secondary | ICD-10-CM

## 2013-02-20 DIAGNOSIS — F329 Major depressive disorder, single episode, unspecified: Secondary | ICD-10-CM

## 2013-02-20 DIAGNOSIS — F3289 Other specified depressive episodes: Secondary | ICD-10-CM

## 2013-02-20 DIAGNOSIS — F32A Depression, unspecified: Secondary | ICD-10-CM

## 2013-02-20 MED ORDER — CYANOCOBALAMIN 1000 MCG/ML IJ SOLN
1000.0000 ug | Freq: Once | INTRAMUSCULAR | Status: AC
  Administered 2013-02-20: 1000 ug via INTRAMUSCULAR

## 2013-02-20 MED ORDER — ESCITALOPRAM OXALATE 5 MG PO TABS: 5.0000 mg | ORAL_TABLET | Freq: Every day | ORAL | Status: DC

## 2013-02-20 NOTE — Progress Notes (Signed)
  Subjective:    Patient ID: Emma Palmer, female    DOB: 04-09-1966, 47 y.o.   MRN: [id number]  HPI Here to follow up depression. She could not tolerate Prozac because it took her sense of taste away. 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    Review of Systems  Constitutional: Negative.   Psychiatric/Behavioral: Positive for dysphoric mood. The patient is nervous/anxious.        Objective:   Physical Exam  Constitutional: She is oriented to person, place, and time. She appears well-developed and well-nourished.  Neurological: She is alert and oriented to person, place, and time.  Psychiatric: She has a normal mood and affect. Her behavior is normal. Thought content normal.          Assessment & Plan:  Try Lexapro at a very low dose (5mg  a day).

## 2013-03-06 ENCOUNTER — Ambulatory Visit (INDEPENDENT_AMBULATORY_CARE_PROVIDER_SITE_OTHER): Payer: BC Managed Care – PPO | Admitting: Family Medicine

## 2013-03-06 DIAGNOSIS — E538 Deficiency of other specified B group vitamins: Secondary | ICD-10-CM

## 2013-03-06 MED ORDER — CYANOCOBALAMIN 1000 MCG/ML IJ SOLN
1000.0000 ug | Freq: Once | INTRAMUSCULAR | Status: AC
  Administered 2013-03-06: 1000 ug via INTRAMUSCULAR

## 2013-03-07 ENCOUNTER — Ambulatory Visit: Payer: BC Managed Care – PPO | Admitting: Family Medicine

## 2013-03-20 ENCOUNTER — Ambulatory Visit (INDEPENDENT_AMBULATORY_CARE_PROVIDER_SITE_OTHER): Payer: BC Managed Care – PPO | Admitting: Family Medicine

## 2013-03-20 DIAGNOSIS — E538 Deficiency of other specified B group vitamins: Secondary | ICD-10-CM

## 2013-03-20 MED ORDER — CYANOCOBALAMIN 1000 MCG/ML IJ SOLN
1000.0000 ug | Freq: Once | INTRAMUSCULAR | Status: AC
  Administered 2013-03-20: 1000 ug via INTRAMUSCULAR

## 2013-04-03 ENCOUNTER — Ambulatory Visit (INDEPENDENT_AMBULATORY_CARE_PROVIDER_SITE_OTHER): Payer: BC Managed Care – PPO | Admitting: Family Medicine

## 2013-04-03 DIAGNOSIS — E538 Deficiency of other specified B group vitamins: Secondary | ICD-10-CM

## 2013-04-03 MED ORDER — CYANOCOBALAMIN 1000 MCG/ML IJ SOLN
1000.0000 ug | Freq: Once | INTRAMUSCULAR | Status: AC
  Administered 2013-04-03: 1000 ug via INTRAMUSCULAR

## 2013-04-17 ENCOUNTER — Ambulatory Visit (INDEPENDENT_AMBULATORY_CARE_PROVIDER_SITE_OTHER): Payer: BC Managed Care – PPO | Admitting: Family Medicine

## 2013-04-17 DIAGNOSIS — E538 Deficiency of other specified B group vitamins: Secondary | ICD-10-CM

## 2013-04-17 MED ORDER — CYANOCOBALAMIN 1000 MCG/ML IJ SOLN
1000.0000 ug | Freq: Once | INTRAMUSCULAR | Status: AC
  Administered 2013-04-17: 1000 ug via INTRAMUSCULAR

## 2013-05-02 ENCOUNTER — Ambulatory Visit (INDEPENDENT_AMBULATORY_CARE_PROVIDER_SITE_OTHER): Payer: BC Managed Care – PPO | Admitting: Family Medicine

## 2013-05-02 DIAGNOSIS — E538 Deficiency of other specified B group vitamins: Secondary | ICD-10-CM

## 2013-05-02 MED ORDER — CYANOCOBALAMIN 1000 MCG/ML IJ SOLN
1000.0000 ug | Freq: Once | INTRAMUSCULAR | Status: AC
  Administered 2013-05-02: 1000 ug via INTRAMUSCULAR

## 2013-05-07 ENCOUNTER — Other Ambulatory Visit: Payer: Self-pay | Admitting: Family Medicine

## 2013-05-08 NOTE — Telephone Encounter (Signed)
Call in #30 with 5 rf 

## 2013-05-15 ENCOUNTER — Telehealth: Payer: Self-pay | Admitting: Family Medicine

## 2013-05-15 NOTE — Telephone Encounter (Signed)
Pt needs refill of hydrocodone 5-325. Pt would like to pu at b12 inj tomorrow.

## 2013-05-16 ENCOUNTER — Ambulatory Visit (INDEPENDENT_AMBULATORY_CARE_PROVIDER_SITE_OTHER): Payer: BC Managed Care – PPO | Admitting: Family Medicine

## 2013-05-16 DIAGNOSIS — E538 Deficiency of other specified B group vitamins: Secondary | ICD-10-CM

## 2013-05-16 MED ORDER — HYDROCODONE-ACETAMINOPHEN 5-325 MG PO TABS: ORAL_TABLET | ORAL | Status: DC

## 2013-05-16 MED ORDER — CYANOCOBALAMIN 1000 MCG/ML IJ SOLN
1000.0000 ug | Freq: Once | INTRAMUSCULAR | Status: AC
  Administered 2013-05-16: 1000 ug via INTRAMUSCULAR

## 2013-05-16 NOTE — Telephone Encounter (Signed)
Script is ready and pt is here to pick up. 

## 2013-05-16 NOTE — Telephone Encounter (Signed)
done

## 2013-05-30 ENCOUNTER — Encounter: Payer: Self-pay | Admitting: Family Medicine

## 2013-05-30 ENCOUNTER — Ambulatory Visit (INDEPENDENT_AMBULATORY_CARE_PROVIDER_SITE_OTHER): Payer: BC Managed Care – PPO | Admitting: Family Medicine

## 2013-05-30 VITALS — BP 96/58 | HR 62 | Temp 97.5°F | Wt 88.0 lb

## 2013-05-30 DIAGNOSIS — E538 Deficiency of other specified B group vitamins: Secondary | ICD-10-CM

## 2013-05-30 DIAGNOSIS — F329 Major depressive disorder, single episode, unspecified: Secondary | ICD-10-CM

## 2013-05-30 DIAGNOSIS — R200 Anesthesia of skin: Secondary | ICD-10-CM

## 2013-05-30 DIAGNOSIS — F3289 Other specified depressive episodes: Secondary | ICD-10-CM

## 2013-05-30 DIAGNOSIS — R51 Headache: Secondary | ICD-10-CM

## 2013-05-30 DIAGNOSIS — R209 Unspecified disturbances of skin sensation: Secondary | ICD-10-CM

## 2013-05-30 MED ORDER — CYANOCOBALAMIN 1000 MCG/ML IJ SOLN
1000.0000 ug | Freq: Once | INTRAMUSCULAR | Status: AC
  Administered 2013-05-30: 1000 ug via INTRAMUSCULAR

## 2013-05-30 NOTE — Progress Notes (Signed)
  Subjective:    Patient ID: Emma Palmer, female    DOB: 1965-10-18, 47 y.o.   MRN: [id number]  HPI Here asking for a referral to see a Neurologist for a host of symptoms including facial numbness or "pressure", tingling in the entire body, and mild HAs. Several family members, including her mother, have had strokes and she is worried that she could be at risk also.   Review of Systems  Constitutional: Negative.   Respiratory: Negative.   Cardiovascular: Negative.   Neurological: Positive for numbness and headaches. Negative for dizziness, tremors, seizures, syncope, facial asymmetry, speech difficulty, weakness and light-headedness.       Objective:   Physical Exam  Constitutional: She is oriented to person, place, and time. She appears well-developed and well-nourished.  Eyes: Conjunctivae and EOM are normal. Pupils are equal, round, and reactive to light.  Cardiovascular: Normal rate, regular rhythm, normal heart sounds and intact distal pulses.   Pulmonary/Chest: Effort normal and breath sounds normal.  Neurological: She is alert and oriented to person, place, and time. She has normal reflexes. No cranial nerve deficit. She exhibits normal muscle tone. Coordination normal.          Assessment & Plan:  She seems to be doing well but because of her concerns we will refer her to Neurology for further evaluation.

## 2013-05-30 NOTE — Progress Notes (Signed)
Pre visit review using our clinic review tool, if applicable. No additional management support is needed unless otherwise documented below in the visit note. 

## 2013-06-12 ENCOUNTER — Ambulatory Visit: Payer: BC Managed Care – PPO | Admitting: Family Medicine

## 2013-06-17 ENCOUNTER — Ambulatory Visit: Payer: BC Managed Care – PPO | Admitting: Family Medicine

## 2013-06-20 ENCOUNTER — Ambulatory Visit (INDEPENDENT_AMBULATORY_CARE_PROVIDER_SITE_OTHER): Payer: BC Managed Care – PPO | Admitting: Family Medicine

## 2013-06-20 DIAGNOSIS — E538 Deficiency of other specified B group vitamins: Secondary | ICD-10-CM

## 2013-06-20 MED ORDER — CYANOCOBALAMIN 1000 MCG/ML IJ SOLN
1000.0000 ug | Freq: Once | INTRAMUSCULAR | Status: AC
  Administered 2013-06-20: 1000 ug via INTRAMUSCULAR

## 2013-07-04 ENCOUNTER — Ambulatory Visit (INDEPENDENT_AMBULATORY_CARE_PROVIDER_SITE_OTHER): Payer: BC Managed Care – PPO | Admitting: Family Medicine

## 2013-07-04 DIAGNOSIS — E538 Deficiency of other specified B group vitamins: Secondary | ICD-10-CM

## 2013-07-04 MED ORDER — CYANOCOBALAMIN 1000 MCG/ML IJ SOLN
1000.0000 ug | Freq: Once | INTRAMUSCULAR | Status: AC
  Administered 2013-07-04: 1000 ug via INTRAMUSCULAR

## 2013-07-23 ENCOUNTER — Ambulatory Visit (INDEPENDENT_AMBULATORY_CARE_PROVIDER_SITE_OTHER): Payer: BC Managed Care – PPO | Admitting: Family Medicine

## 2013-07-23 DIAGNOSIS — E538 Deficiency of other specified B group vitamins: Secondary | ICD-10-CM

## 2013-07-23 MED ORDER — CYANOCOBALAMIN 1000 MCG/ML IJ SOLN
1000.0000 ug | Freq: Once | INTRAMUSCULAR | Status: AC
  Administered 2013-07-23: 1000 ug via INTRAMUSCULAR

## 2013-08-16 ENCOUNTER — Telehealth: Payer: Self-pay | Admitting: Family Medicine

## 2013-08-16 NOTE — Telephone Encounter (Signed)
Received 4 pages from [facility], sent to Dr[facility] Clent Rid[facility]Fry [facility]. 08/16/13/ss

## 2013-08-21 ENCOUNTER — Telehealth: Payer: Self-pay | Admitting: Family Medicine

## 2013-08-21 NOTE — Telephone Encounter (Signed)
Pt needs new rx hydrocodone. Pt would like 3 rxs for next 3 months. Pt has appt on 08-23-13 for b12 injection. Pt would like to pick up rxs this friday

## 2013-08-22 ENCOUNTER — Telehealth: Payer: Self-pay | Admitting: Family Medicine

## 2013-08-22 MED ORDER — HYDROCODONE-ACETAMINOPHEN 5-325 MG PO TABS: ORAL_TABLET | ORAL | Status: DC

## 2013-08-22 NOTE — Telephone Encounter (Signed)
Script is ready for pick up, contract printed and pt is coming in for a injection on 12/17/13, will pick up then.

## 2013-08-22 NOTE — Telephone Encounter (Signed)
Recd a record from [facility] Forwarding 1pg to Dr.Fry

## 2013-08-22 NOTE — Telephone Encounter (Signed)
Done for one month only. She needs testing and a contract  

## 2013-08-23 ENCOUNTER — Ambulatory Visit (INDEPENDENT_AMBULATORY_CARE_PROVIDER_SITE_OTHER): Payer: BC Managed Care – PPO | Admitting: Family Medicine

## 2013-08-23 DIAGNOSIS — E538 Deficiency of other specified B group vitamins: Secondary | ICD-10-CM

## 2013-08-23 MED ORDER — CYANOCOBALAMIN 1000 MCG/ML IJ SOLN
1000.0000 ug | Freq: Once | INTRAMUSCULAR | Status: AC
  Administered 2013-08-23: 1000 ug via INTRAMUSCULAR

## 2013-09-11 ENCOUNTER — Telehealth: Payer: Self-pay | Admitting: Family Medicine

## 2013-09-11 NOTE — Telephone Encounter (Signed)
Received 4 pages from [facility] [place], sent to Dr. Clent RidgesFry @ [facility] via inner office on 09/11/13/ss.

## 2013-09-17 ENCOUNTER — Encounter: Payer: Self-pay | Admitting: Family Medicine

## 2013-09-23 ENCOUNTER — Telehealth: Payer: Self-pay | Admitting: Family Medicine

## 2013-09-23 MED ORDER — HYDROCODONE-ACETAMINOPHEN 5-325 MG PO TABS: ORAL_TABLET | ORAL | Status: DC

## 2013-09-23 NOTE — Telephone Encounter (Signed)
Done for 3 months

## 2013-09-23 NOTE — Telephone Encounter (Signed)
Pt is needing new rx HYDROcodone-acetaminophen (NORCO/VICODIN) 5-325 MG per tablet, for 90 day supply, please call to advise when available for p/u.

## 2013-09-24 NOTE — Telephone Encounter (Signed)
Script is ready for pick up and I spoke with pt.  

## 2013-09-25 ENCOUNTER — Telehealth: Payer: Self-pay | Admitting: Family Medicine

## 2013-09-25 NOTE — Telephone Encounter (Signed)
Call in 180 ml with 2 rf

## 2013-09-25 NOTE — Telephone Encounter (Signed)
Pt requesting refill of nystatin (MYCOSTATIN) 100000 UNIT/ML suspension °[facility] [place]  [phone] °

## 2013-09-25 NOTE — Telephone Encounter (Signed)
Pt requesting refill of nystatin (MYCOSTATIN) 100000 UNIT/ML suspension [facility] [place]  [phone]

## 2013-09-26 MED ORDER — NYSTATIN 100000 UNIT/ML MT SUSP: OROMUCOSAL | Status: DC

## 2013-09-26 NOTE — Telephone Encounter (Addendum)
Pt following up on med request. nystatin (MYCOSTATIN) 100000 UNIT/ML suspension pls advise, thanks [facility] [place] [place]

## 2013-09-26 NOTE — Addendum Note (Signed)
Addended by: Aniceto BossNIMMONS, Rozell Kettlewell A on: 09/26/2013 10:47 AM   Modules accepted: Orders

## 2013-09-26 NOTE — Telephone Encounter (Signed)
I sent script e-scribe and spoke with pt. 

## 2013-10-02 ENCOUNTER — Ambulatory Visit (INDEPENDENT_AMBULATORY_CARE_PROVIDER_SITE_OTHER): Payer: BC Managed Care – PPO | Admitting: Family Medicine

## 2013-10-02 ENCOUNTER — Other Ambulatory Visit: Payer: Self-pay | Admitting: Family Medicine

## 2013-10-02 DIAGNOSIS — E538 Deficiency of other specified B group vitamins: Secondary | ICD-10-CM

## 2013-10-02 MED ORDER — CYANOCOBALAMIN 1000 MCG/ML IJ SOLN
1000.0000 ug | Freq: Once | INTRAMUSCULAR | Status: AC
  Administered 2013-10-02: 1000 ug via INTRAMUSCULAR

## 2013-10-22 ENCOUNTER — Other Ambulatory Visit: Payer: Self-pay | Admitting: Family Medicine

## 2013-10-23 NOTE — Telephone Encounter (Signed)
Can refill 1 month only  Dr Clent RidgesFry out of office  further refills per PCP

## 2013-11-21 ENCOUNTER — Other Ambulatory Visit: Payer: Self-pay | Admitting: Internal Medicine

## 2013-11-22 ENCOUNTER — Telehealth: Payer: Self-pay | Admitting: Family Medicine

## 2013-11-22 MED ORDER — TEMAZEPAM 30 MG PO CAPS: ORAL_CAPSULE | ORAL | Status: DC

## 2013-11-22 NOTE — Telephone Encounter (Signed)
Call in #30 with 5 rf 

## 2013-11-22 NOTE — Telephone Encounter (Signed)
I called in Restoril, see refill request.

## 2013-12-06 ENCOUNTER — Telehealth: Payer: Self-pay | Admitting: Family Medicine

## 2013-12-06 NOTE — Telephone Encounter (Signed)
NO she is not due until June 9

## 2013-12-06 NOTE — Telephone Encounter (Signed)
Pt needs rxs for next 3 month hydrocodone. Pt has appt on 12-11-13

## 2013-12-06 NOTE — Telephone Encounter (Signed)
I tried to reach pt by phone, no answer or option to leave a message. 

## 2013-12-11 ENCOUNTER — Other Ambulatory Visit: Payer: BC Managed Care – PPO

## 2013-12-11 ENCOUNTER — Ambulatory Visit: Payer: BC Managed Care – PPO | Admitting: Family Medicine

## 2013-12-24 ENCOUNTER — Ambulatory Visit (INDEPENDENT_AMBULATORY_CARE_PROVIDER_SITE_OTHER): Payer: BC Managed Care – PPO | Admitting: Family Medicine

## 2013-12-24 ENCOUNTER — Other Ambulatory Visit (INDEPENDENT_AMBULATORY_CARE_PROVIDER_SITE_OTHER): Payer: BC Managed Care – PPO

## 2013-12-24 ENCOUNTER — Telehealth: Payer: Self-pay | Admitting: Family Medicine

## 2013-12-24 DIAGNOSIS — E538 Deficiency of other specified B group vitamins: Secondary | ICD-10-CM

## 2013-12-24 LAB — VITAMIN B12: VITAMIN B 12: 546 pg/mL (ref 211–911)

## 2013-12-24 MED ORDER — HYDROCODONE-ACETAMINOPHEN 5-325 MG PO TABS: ORAL_TABLET | ORAL | Status: DC

## 2013-12-24 MED ORDER — CYANOCOBALAMIN 1000 MCG/ML IJ SOLN
1000.0000 ug | Freq: Once | INTRAMUSCULAR | Status: AC
  Administered 2013-12-24: 1000 ug via INTRAMUSCULAR

## 2013-12-24 NOTE — Telephone Encounter (Signed)
Refill request for Vicodin

## 2013-12-24 NOTE — Telephone Encounter (Signed)
Pt is here to pick up script and it is ready.

## 2013-12-24 NOTE — Telephone Encounter (Signed)
done

## 2013-12-26 ENCOUNTER — Other Ambulatory Visit: Payer: Self-pay | Admitting: Family Medicine

## 2013-12-26 DIAGNOSIS — E538 Deficiency of other specified B group vitamins: Secondary | ICD-10-CM

## 2014-02-10 ENCOUNTER — Encounter: Payer: Self-pay | Admitting: Family Medicine

## 2014-02-10 ENCOUNTER — Ambulatory Visit (INDEPENDENT_AMBULATORY_CARE_PROVIDER_SITE_OTHER): Payer: BC Managed Care – PPO | Admitting: Family Medicine

## 2014-02-10 VITALS — BP 85/54 | HR 54 | Temp 98.8°F | Ht 60.5 in | Wt 87.0 lb

## 2014-02-10 DIAGNOSIS — F5001 Anorexia nervosa, restricting type: Secondary | ICD-10-CM | POA: Insufficient documentation

## 2014-02-10 DIAGNOSIS — E538 Deficiency of other specified B group vitamins: Secondary | ICD-10-CM

## 2014-02-10 DIAGNOSIS — F502 Bulimia nervosa, unspecified: Secondary | ICD-10-CM

## 2014-02-10 DIAGNOSIS — F329 Major depressive disorder, single episode, unspecified: Secondary | ICD-10-CM

## 2014-02-10 DIAGNOSIS — F411 Generalized anxiety disorder: Secondary | ICD-10-CM

## 2014-02-10 DIAGNOSIS — F3289 Other specified depressive episodes: Secondary | ICD-10-CM

## 2014-02-10 MED ORDER — CYANOCOBALAMIN 1000 MCG/ML IJ SOLN
1000.0000 ug | Freq: Once | INTRAMUSCULAR | Status: AC
  Administered 2014-02-10: 1000 ug via INTRAMUSCULAR

## 2014-02-10 MED ORDER — FLUOXETINE HCL 10 MG PO TABS: 5.0000 mg | ORAL_TABLET | Freq: Every day | ORAL | Status: DC

## 2014-02-10 NOTE — Progress Notes (Signed)
   Subjective:    Patient ID: Emma KernsKaren P Palmer, female    DOB: 1966/01/21, 48 y.o.   MRN: [id number]  HPI Here to follow up. She had seen Dr. Jerolyn ShinGregory Mieden, a neurologist in [facility] in January for an evaluation which included labs and a brain MRI. 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 He did not have much to offer her so she will not be seeing him again. She has made an appt to see Emma Pavlovennis McKnight PhD at [facility] soon. She still has trouble with depression and sleep, and she asks if she could try a lower dose of prozac than the 10 mg she had used before.    Review of Systems  Constitutional: Negative.   Neurological: Negative.   Psychiatric/Behavioral: Positive for sleep disturbance, dysphoric mood and decreased concentration. Negative for hallucinations and confusion. The patient is nervous/anxious.        Objective:   Physical Exam  Constitutional: She is oriented to person, place, and time. She appears well-developed and well-nourished.  Cardiovascular: Normal rate, regular rhythm, normal heart sounds and intact distal pulses.   Pulmonary/Chest: Effort normal and breath sounds normal.  Neurological: She is alert and oriented to person, place, and time.  Psychiatric: She has a normal mood and affect. Her behavior is normal. Judgment and thought content normal.          Assessment & Plan:  We will try taking 5 mg of prozac daily.

## 2014-02-10 NOTE — Progress Notes (Signed)
Pre visit review using our clinic review tool, if applicable. No additional management support is needed unless otherwise documented below in the visit note. 

## 2014-02-17 ENCOUNTER — Telehealth: Payer: Self-pay | Admitting: Family Medicine

## 2014-02-17 NOTE — Telephone Encounter (Signed)
Call in Wellbutrin 75 mg daily, #30 with 5 rf

## 2014-02-17 NOTE — Telephone Encounter (Signed)
Pt called to ask if Dr Clent RidgesFry will write her the lowest dose of wellbutrin as discuss in the last visit .   [facility] [address]  PHONE NUMBER; [phone]

## 2014-02-18 MED ORDER — BUPROPION HCL 75 MG PO TABS: 75.0000 mg | ORAL_TABLET | Freq: Every day | ORAL | Status: DC

## 2014-02-18 NOTE — Telephone Encounter (Signed)
I sent script e-scribe and left a voice message for pt. 

## 2014-03-26 ENCOUNTER — Ambulatory Visit: Payer: BC Managed Care – PPO | Admitting: Family Medicine

## 2014-04-01 ENCOUNTER — Ambulatory Visit (INDEPENDENT_AMBULATORY_CARE_PROVIDER_SITE_OTHER): Payer: BC Managed Care – PPO | Admitting: Family Medicine

## 2014-04-01 ENCOUNTER — Encounter: Payer: Self-pay | Admitting: Family Medicine

## 2014-04-01 VITALS — BP 82/58 | HR 55 | Temp 98.6°F | Ht 60.5 in | Wt 87.0 lb

## 2014-04-01 DIAGNOSIS — F411 Generalized anxiety disorder: Secondary | ICD-10-CM

## 2014-04-01 DIAGNOSIS — F329 Major depressive disorder, single episode, unspecified: Secondary | ICD-10-CM

## 2014-04-01 DIAGNOSIS — R1013 Epigastric pain: Secondary | ICD-10-CM

## 2014-04-01 DIAGNOSIS — E538 Deficiency of other specified B group vitamins: Secondary | ICD-10-CM

## 2014-04-01 DIAGNOSIS — F3289 Other specified depressive episodes: Secondary | ICD-10-CM

## 2014-04-01 DIAGNOSIS — F502 Bulimia nervosa: Secondary | ICD-10-CM

## 2014-04-01 LAB — H. PYLORI ANTIBODY, IGG: H Pylori IgG: POSITIVE — AB

## 2014-04-01 MED ORDER — SUVOREXANT 20 MG PO TABS: 20.0000 mg | ORAL_TABLET | Freq: Every evening | ORAL | Status: DC | PRN

## 2014-04-01 MED ORDER — HYDROCODONE-ACETAMINOPHEN 5-325 MG PO TABS: ORAL_TABLET | ORAL | Status: DC

## 2014-04-01 MED ORDER — AMOXICILL-CLARITHRO-LANSOPRAZ PO MISC: Freq: Two times a day (BID) | ORAL | Status: DC

## 2014-04-01 MED ORDER — CYANOCOBALAMIN 1000 MCG/ML IJ SOLN
1000.0000 ug | Freq: Once | INTRAMUSCULAR | Status: AC
  Administered 2014-04-01: 1000 ug via INTRAMUSCULAR

## 2014-04-01 NOTE — Progress Notes (Signed)
   Subjective:    Patient ID: Emma Palmer, female    DOB: [birth year], 48 y.o.   MRN: [id number]  HPI Here for refills and to ask for treatment for H pylori.  For the past week she has had burning epigastric pains despite taking Nexium. No fever or nausea. She has been treated twice before for H pylori infections, and she says this feels the exact same way.    Review of Systems  Constitutional: Negative.   Respiratory: Negative.   Cardiovascular: Negative.   Gastrointestinal: Positive for abdominal pain. Negative for nausea, vomiting, diarrhea, constipation, blood in stool, abdominal distention, anal bleeding and rectal pain.       Objective:   Physical Exam  Constitutional: She appears well-developed and well-nourished.  Cardiovascular: Normal rate, regular rhythm, normal heart sounds and intact distal pulses.   Pulmonary/Chest: Effort normal and breath sounds normal.  Abdominal: Soft. Bowel sounds are normal. She exhibits no distension and no mass. There is no tenderness. There is no rebound and no guarding.          Assessment & Plan:  Treat with a Prevpac. Check H pylori titer.

## 2014-04-01 NOTE — Progress Notes (Signed)
Pre visit review using our clinic review tool, if applicable. No additional management support is needed unless otherwise documented below in the visit note. 

## 2014-04-03 ENCOUNTER — Telehealth: Payer: Self-pay | Admitting: Family Medicine

## 2014-04-03 NOTE — Telephone Encounter (Signed)
[employer] [place] denied [place] for Belsomra.  Pt must try and fail 1 of the formulary alternatives:  Ambien, Sonata, or [place]

## 2014-04-04 NOTE — Telephone Encounter (Signed)
Please appeal because she has already tried Ambien in the past and it did not work

## 2014-04-04 NOTE — Telephone Encounter (Signed)
I spoke with pt and she would like to try either the Sonata or Lunesta, not the Ambien.

## 2014-04-07 ENCOUNTER — Encounter: Payer: Self-pay | Admitting: Family Medicine

## 2014-04-07 NOTE — Telephone Encounter (Signed)
Appeal has been submitted

## 2014-04-10 NOTE — Telephone Encounter (Signed)
Appeal was APPROVED ref#[id number].

## 2014-04-15 ENCOUNTER — Ambulatory Visit (INDEPENDENT_AMBULATORY_CARE_PROVIDER_SITE_OTHER): Payer: BC Managed Care – PPO | Admitting: Family Medicine

## 2014-04-15 ENCOUNTER — Encounter: Payer: Self-pay | Admitting: Family Medicine

## 2014-04-15 VITALS — BP 98/60 | HR 62 | Temp 98.9°F | Ht 60.5 in | Wt 88.0 lb

## 2014-04-15 DIAGNOSIS — M1610 Unilateral primary osteoarthritis, unspecified hip: Secondary | ICD-10-CM

## 2014-04-15 DIAGNOSIS — E538 Deficiency of other specified B group vitamins: Secondary | ICD-10-CM

## 2014-04-15 DIAGNOSIS — K219 Gastro-esophageal reflux disease without esophagitis: Secondary | ICD-10-CM

## 2014-04-15 MED ORDER — CYANOCOBALAMIN 1000 MCG/ML IJ SOLN
1000.0000 ug | Freq: Once | INTRAMUSCULAR | Status: AC
  Administered 2014-04-15: 1000 ug via INTRAMUSCULAR

## 2014-04-15 MED ORDER — ESOMEPRAZOLE MAGNESIUM 20 MG PO CPDR: 20.0000 mg | DELAYED_RELEASE_CAPSULE | Freq: Two times a day (BID) | ORAL | Status: DC

## 2014-04-15 NOTE — Addendum Note (Signed)
Addended by: Aniceto BossNIMMONS, SYLVIA A on: 04/15/2014 09:44 AM   Modules accepted: Orders

## 2014-04-15 NOTE — Progress Notes (Signed)
   Subjective:    Patient ID: Emma Palmer, female    DOB: 31-Jan-1966, 48 y.o.   MRN: [id number]  HPI Here to follow up. She just finished the Prevpak and she feels much better. Her abdomen pain has resolved. She does ask about pain in the hips and the right foot. She saw Dr. Leslee HomeApplington for arthritis in the past.    Review of Systems  Constitutional: Negative.   Gastrointestinal: Negative.   Musculoskeletal: Positive for arthralgias. Negative for joint swelling.       Objective:   Physical Exam  Constitutional: She appears well-developed and well-nourished.  Abdominal: Soft. Bowel sounds are normal. She exhibits no distension and no mass. There is no tenderness. There is no rebound and no guarding.  Musculoskeletal: Normal range of motion. She exhibits no edema and no tenderness.          Assessment & Plan:  She will resume OTC Nexium 20 mg but we will increase this to bid. Refer to Dr. Leslee HomeApplington for her joint pains

## 2014-04-15 NOTE — Progress Notes (Signed)
Pre visit review using our clinic review tool, if applicable. No additional management support is needed unless otherwise documented below in the visit note. 

## 2014-04-16 ENCOUNTER — Telehealth: Payer: Self-pay | Admitting: Family Medicine

## 2014-04-16 NOTE — Telephone Encounter (Signed)
emmi mailed  °

## 2014-05-12 ENCOUNTER — Ambulatory Visit (INDEPENDENT_AMBULATORY_CARE_PROVIDER_SITE_OTHER): Payer: BC Managed Care – PPO | Admitting: Family Medicine

## 2014-05-12 DIAGNOSIS — E538 Deficiency of other specified B group vitamins: Secondary | ICD-10-CM

## 2014-05-12 MED ORDER — CYANOCOBALAMIN 1000 MCG/ML IJ SOLN
1000.0000 ug | Freq: Once | INTRAMUSCULAR | Status: AC
  Administered 2014-05-12: 1000 ug via INTRAMUSCULAR

## 2014-05-14 ENCOUNTER — Other Ambulatory Visit: Payer: Self-pay | Admitting: Family Medicine

## 2014-05-15 NOTE — Telephone Encounter (Signed)
Call in #30 with 5 rf 

## 2014-05-19 NOTE — Telephone Encounter (Signed)
Please refill.

## 2014-06-20 ENCOUNTER — Ambulatory Visit (INDEPENDENT_AMBULATORY_CARE_PROVIDER_SITE_OTHER): Payer: BC Managed Care – PPO | Admitting: Family Medicine

## 2014-06-20 ENCOUNTER — Encounter: Payer: Self-pay | Admitting: Family Medicine

## 2014-06-20 VITALS — BP 100/68 | Temp 98.3°F | Wt 89.5 lb

## 2014-06-20 DIAGNOSIS — E538 Deficiency of other specified B group vitamins: Secondary | ICD-10-CM

## 2014-06-20 DIAGNOSIS — N3281 Overactive bladder: Secondary | ICD-10-CM

## 2014-06-20 DIAGNOSIS — R300 Dysuria: Secondary | ICD-10-CM

## 2014-06-20 LAB — POCT URINALYSIS DIPSTICK
BILIRUBIN UA: NEGATIVE
Glucose, UA: NEGATIVE
KETONES UA: NEGATIVE
Leukocytes, UA: NEGATIVE
Nitrite, UA: NEGATIVE
Protein, UA: NEGATIVE
Spec Grav, UA: 1.01
Urobilinogen, UA: 0.2
pH, UA: 6.5

## 2014-06-20 MED ORDER — CYANOCOBALAMIN 1000 MCG/ML IJ SOLN
1000.0000 ug | Freq: Once | INTRAMUSCULAR | Status: AC
  Administered 2014-06-20: 1000 ug via INTRAMUSCULAR

## 2014-06-20 NOTE — Progress Notes (Signed)
Pre visit review using our clinic review tool, if applicable. No additional management support is needed unless otherwise documented below in the visit note. 

## 2014-06-20 NOTE — Progress Notes (Signed)
   Subjective:    Patient ID: Emma Palmer, female    DOB: 1965/08/30, 48 y.o.   MRN: [id number]  HPI Here to check her urine. She has had frequency and urgency for the past few weeks. No burning or pain or fever. She saw Urology a year ago and was diagnosed with OAB. She was given samples and a rx for Myrbetriq, but she never tried it.   Review of Systems  Constitutional: Negative.   Genitourinary: Positive for urgency and frequency. Negative for dysuria, hematuria and flank pain.       Objective:   Physical Exam  Constitutional: She appears well-developed and well-nourished.  Abdominal: Soft. Bowel sounds are normal. She exhibits no distension and no mass. There is no tenderness. There is no rebound and no guarding.          Assessment & Plan:  Her urine is clear. I agree she has OAB. She will try the Myrbetriq samples and see how they work

## 2014-07-01 ENCOUNTER — Telehealth: Payer: Self-pay | Admitting: Family Medicine

## 2014-07-01 NOTE — Telephone Encounter (Signed)
Pt needs new rxs for hydrocodone for next 3 months. Pt has appt on 07/04/14

## 2014-07-02 MED ORDER — HYDROCODONE-ACETAMINOPHEN 5-325 MG PO TABS: ORAL_TABLET | ORAL | Status: DC

## 2014-07-02 NOTE — Telephone Encounter (Signed)
done

## 2014-07-02 NOTE — Telephone Encounter (Signed)
Script is ready for pick up and I spoke with pt.  

## 2014-07-04 ENCOUNTER — Ambulatory Visit (INDEPENDENT_AMBULATORY_CARE_PROVIDER_SITE_OTHER): Payer: BC Managed Care – PPO | Admitting: Family Medicine

## 2014-07-04 DIAGNOSIS — E538 Deficiency of other specified B group vitamins: Secondary | ICD-10-CM

## 2014-07-04 MED ORDER — CYANOCOBALAMIN 1000 MCG/ML IJ SOLN
1000.0000 ug | Freq: Once | INTRAMUSCULAR | Status: AC
  Administered 2014-07-04: 1000 ug via INTRAMUSCULAR

## 2014-08-01 ENCOUNTER — Ambulatory Visit (INDEPENDENT_AMBULATORY_CARE_PROVIDER_SITE_OTHER): Payer: BLUE CROSS/BLUE SHIELD | Admitting: Family Medicine

## 2014-08-01 DIAGNOSIS — E538 Deficiency of other specified B group vitamins: Secondary | ICD-10-CM

## 2014-08-01 MED ORDER — CYANOCOBALAMIN 1000 MCG/ML IJ SOLN
1000.0000 ug | Freq: Once | INTRAMUSCULAR | Status: AC
  Administered 2014-08-01: 1000 ug via INTRAMUSCULAR

## 2014-09-04 ENCOUNTER — Ambulatory Visit (INDEPENDENT_AMBULATORY_CARE_PROVIDER_SITE_OTHER): Payer: BLUE CROSS/BLUE SHIELD | Admitting: Family Medicine

## 2014-09-04 DIAGNOSIS — E538 Deficiency of other specified B group vitamins: Secondary | ICD-10-CM

## 2014-09-04 MED ORDER — CYANOCOBALAMIN 1000 MCG/ML IJ SOLN
1000.0000 ug | Freq: Once | INTRAMUSCULAR | Status: AC
  Administered 2014-09-04: 1000 ug via INTRAMUSCULAR

## 2014-09-18 ENCOUNTER — Ambulatory Visit (INDEPENDENT_AMBULATORY_CARE_PROVIDER_SITE_OTHER): Payer: BLUE CROSS/BLUE SHIELD | Admitting: Family Medicine

## 2014-09-18 DIAGNOSIS — E538 Deficiency of other specified B group vitamins: Secondary | ICD-10-CM

## 2014-09-18 MED ORDER — CYANOCOBALAMIN 1000 MCG/ML IJ SOLN
1000.0000 ug | Freq: Once | INTRAMUSCULAR | Status: AC
  Administered 2014-09-18: 1000 ug via INTRAMUSCULAR

## 2014-10-02 ENCOUNTER — Other Ambulatory Visit: Payer: Self-pay | Admitting: Family Medicine

## 2014-10-02 MED ORDER — HYDROCODONE-ACETAMINOPHEN 5-325 MG PO TABS: ORAL_TABLET | ORAL | Status: DC

## 2014-10-02 NOTE — Telephone Encounter (Signed)
Pt aware and notes that will pick it up tomorrow when she comes to get her b12 inj

## 2014-10-02 NOTE — Telephone Encounter (Signed)
done

## 2014-10-02 NOTE — Telephone Encounter (Signed)
Pt request refill of the following: HYDROcodone-acetaminophen (NORCO/VICODIN) 5-325 MG per tablet ° ° °Phamacy: °

## 2014-10-03 ENCOUNTER — Ambulatory Visit (INDEPENDENT_AMBULATORY_CARE_PROVIDER_SITE_OTHER): Payer: BLUE CROSS/BLUE SHIELD | Admitting: Family Medicine

## 2014-10-03 DIAGNOSIS — E538 Deficiency of other specified B group vitamins: Secondary | ICD-10-CM

## 2014-10-03 MED ORDER — CYANOCOBALAMIN 1000 MCG/ML IJ SOLN
1000.0000 ug | Freq: Once | INTRAMUSCULAR | Status: AC
  Administered 2014-10-03: 1000 ug via INTRAMUSCULAR

## 2014-10-16 ENCOUNTER — Ambulatory Visit (INDEPENDENT_AMBULATORY_CARE_PROVIDER_SITE_OTHER): Payer: BLUE CROSS/BLUE SHIELD | Admitting: Family Medicine

## 2014-10-16 DIAGNOSIS — E538 Deficiency of other specified B group vitamins: Secondary | ICD-10-CM

## 2014-10-16 MED ORDER — CYANOCOBALAMIN 1000 MCG/ML IJ SOLN
1000.0000 ug | Freq: Once | INTRAMUSCULAR | Status: AC
  Administered 2014-10-16: 1000 ug via INTRAMUSCULAR

## 2014-10-31 ENCOUNTER — Ambulatory Visit (INDEPENDENT_AMBULATORY_CARE_PROVIDER_SITE_OTHER): Payer: BLUE CROSS/BLUE SHIELD | Admitting: Family Medicine

## 2014-10-31 DIAGNOSIS — E538 Deficiency of other specified B group vitamins: Secondary | ICD-10-CM

## 2014-10-31 MED ORDER — CYANOCOBALAMIN 1000 MCG/ML IJ SOLN
1000.0000 ug | Freq: Once | INTRAMUSCULAR | Status: AC
  Administered 2014-10-31: 1000 ug via INTRAMUSCULAR

## 2014-11-07 ENCOUNTER — Other Ambulatory Visit: Payer: Self-pay | Admitting: Family Medicine

## 2014-11-10 NOTE — Telephone Encounter (Signed)
Call in #30 with 5 rf 

## 2014-11-13 ENCOUNTER — Ambulatory Visit (INDEPENDENT_AMBULATORY_CARE_PROVIDER_SITE_OTHER): Payer: BLUE CROSS/BLUE SHIELD | Admitting: Family Medicine

## 2014-11-13 DIAGNOSIS — E538 Deficiency of other specified B group vitamins: Secondary | ICD-10-CM | POA: Diagnosis not present

## 2014-11-13 MED ORDER — CYANOCOBALAMIN 1000 MCG/ML IJ SOLN
1000.0000 ug | Freq: Once | INTRAMUSCULAR | Status: AC
  Administered 2014-11-13: 1000 ug via INTRAMUSCULAR

## 2014-12-01 ENCOUNTER — Ambulatory Visit (INDEPENDENT_AMBULATORY_CARE_PROVIDER_SITE_OTHER): Payer: BLUE CROSS/BLUE SHIELD | Admitting: Family Medicine

## 2014-12-01 DIAGNOSIS — E538 Deficiency of other specified B group vitamins: Secondary | ICD-10-CM | POA: Diagnosis not present

## 2014-12-01 MED ORDER — CYANOCOBALAMIN 1000 MCG/ML IJ SOLN
1000.0000 ug | Freq: Once | INTRAMUSCULAR | Status: AC
  Administered 2014-12-01: 1000 ug via INTRAMUSCULAR

## 2014-12-09 ENCOUNTER — Telehealth: Payer: Self-pay | Admitting: Family Medicine

## 2014-12-09 NOTE — Telephone Encounter (Signed)
Pt needs new rx hydrocodone for next 3 months

## 2014-12-09 NOTE — Telephone Encounter (Signed)
NO it is too early. She is not due until 01-02-15.

## 2014-12-10 NOTE — Telephone Encounter (Signed)
I spoke with pt and she does another script, she had dropped it off at pharmacy.

## 2014-12-12 ENCOUNTER — Ambulatory Visit (INDEPENDENT_AMBULATORY_CARE_PROVIDER_SITE_OTHER): Payer: BLUE CROSS/BLUE SHIELD | Admitting: Family Medicine

## 2014-12-12 DIAGNOSIS — E538 Deficiency of other specified B group vitamins: Secondary | ICD-10-CM

## 2014-12-12 MED ORDER — CYANOCOBALAMIN 1000 MCG/ML IJ SOLN
1000.0000 ug | Freq: Once | INTRAMUSCULAR | Status: AC
  Administered 2014-12-12: 1000 ug via INTRAMUSCULAR

## 2014-12-16 ENCOUNTER — Ambulatory Visit: Payer: BLUE CROSS/BLUE SHIELD | Admitting: Family Medicine

## 2014-12-25 ENCOUNTER — Ambulatory Visit: Payer: BLUE CROSS/BLUE SHIELD | Admitting: Family Medicine

## 2014-12-26 ENCOUNTER — Ambulatory Visit (INDEPENDENT_AMBULATORY_CARE_PROVIDER_SITE_OTHER): Payer: BLUE CROSS/BLUE SHIELD | Admitting: Family Medicine

## 2014-12-26 DIAGNOSIS — E538 Deficiency of other specified B group vitamins: Secondary | ICD-10-CM | POA: Diagnosis not present

## 2014-12-26 MED ORDER — CYANOCOBALAMIN 1000 MCG/ML IJ SOLN
1000.0000 ug | Freq: Once | INTRAMUSCULAR | Status: AC
  Administered 2014-12-26: 1000 ug via INTRAMUSCULAR

## 2014-12-26 NOTE — Progress Notes (Deleted)
   Subjective:    Patient ID: Emma Palmer, female    DOB: Dec 10, 1965, 49 y.o.   MRN: [id number]  HPI    Review of Systems     Objective:   Physical Exam        Assessment & Plan:

## 2015-01-06 ENCOUNTER — Telehealth: Payer: Self-pay | Admitting: Family Medicine

## 2015-01-06 NOTE — Telephone Encounter (Signed)
Patient would like re-fill on HYDROcodone-acetaminophen (NORCO/VICODIN) 5-325 MG per tablet.  And nystatin (MYCOSTATIN) 100000 UNIT/ML [id number] [place] [place] - [address]

## 2015-01-08 MED ORDER — HYDROCODONE-ACETAMINOPHEN 5-325 MG PO TABS: ORAL_TABLET | ORAL | Status: DC

## 2015-01-08 MED ORDER — NYSTATIN 100000 UNIT/ML MT SUSP: OROMUCOSAL | Status: DC

## 2015-01-08 NOTE — Telephone Encounter (Signed)
Pt aware Rx ready for pick up 

## 2015-01-08 NOTE — Telephone Encounter (Signed)
Norco is ready to be picked up. I already sent  In the Nystatin

## 2015-01-09 ENCOUNTER — Ambulatory Visit (INDEPENDENT_AMBULATORY_CARE_PROVIDER_SITE_OTHER): Payer: BLUE CROSS/BLUE SHIELD | Admitting: Family Medicine

## 2015-01-09 DIAGNOSIS — E538 Deficiency of other specified B group vitamins: Secondary | ICD-10-CM

## 2015-01-09 MED ORDER — CYANOCOBALAMIN 1000 MCG/ML IJ SOLN
1000.0000 ug | Freq: Once | INTRAMUSCULAR | Status: AC
  Administered 2015-01-09: 1000 ug via INTRAMUSCULAR

## 2015-01-12 ENCOUNTER — Telehealth: Payer: Self-pay | Admitting: Family Medicine

## 2015-01-12 NOTE — Telephone Encounter (Signed)
Pt said the wrong mouth wash was ordered    Nystatin hydrocort/diphenhis is the correct name for the mouth wash    [facility] [place]

## 2015-01-13 ENCOUNTER — Telehealth: Payer: Self-pay | Admitting: Family Medicine

## 2015-01-13 MED ORDER — MAGIC MOUTHWASH: 5.0000 mL | ORAL | Status: DC | PRN

## 2015-01-13 NOTE — Telephone Encounter (Signed)
Pharm called for clarification on Alum & Mag Hydroxide-Simeth (MAGIC MOUTHWASH) SOLN pls call back

## 2015-01-13 NOTE — Telephone Encounter (Signed)
[facility] and [facility]

## 2015-01-13 NOTE — Telephone Encounter (Signed)
Please change this to Magic Mouthwash

## 2015-01-14 NOTE — Telephone Encounter (Signed)
Pt states she needs nystatin/hydrocort/dithenhis  Pt doesn't want to pu anything but this.  [facility] [place]

## 2015-01-14 NOTE — Telephone Encounter (Signed)
I spoke with pharmacy and did clarify the below medication, also left a voice message for pt with this information.

## 2015-01-16 ENCOUNTER — Other Ambulatory Visit: Payer: Self-pay | Admitting: Family Medicine

## 2015-01-16 NOTE — Telephone Encounter (Signed)
Per Dr. Clent RidgesFry okay to send in with 1 refill.

## 2015-01-16 NOTE — Telephone Encounter (Signed)
I spoke with pharmacy and they will send over request electronically. I did receive this and Dr. Clent RidgesFry approved. I sent script e-scribe.

## 2015-01-26 ENCOUNTER — Ambulatory Visit (INDEPENDENT_AMBULATORY_CARE_PROVIDER_SITE_OTHER): Payer: BLUE CROSS/BLUE SHIELD | Admitting: Family Medicine

## 2015-01-26 DIAGNOSIS — E538 Deficiency of other specified B group vitamins: Secondary | ICD-10-CM

## 2015-01-26 MED ORDER — CYANOCOBALAMIN 1000 MCG/ML IJ SOLN
1000.0000 ug | Freq: Once | INTRAMUSCULAR | Status: AC
  Administered 2015-01-26: 1000 ug via INTRAMUSCULAR

## 2015-02-03 ENCOUNTER — Encounter: Payer: Self-pay | Admitting: Cardiology

## 2015-02-09 ENCOUNTER — Ambulatory Visit (INDEPENDENT_AMBULATORY_CARE_PROVIDER_SITE_OTHER): Payer: BLUE CROSS/BLUE SHIELD | Admitting: Family Medicine

## 2015-02-09 DIAGNOSIS — E538 Deficiency of other specified B group vitamins: Secondary | ICD-10-CM | POA: Diagnosis not present

## 2015-02-09 MED ORDER — CYANOCOBALAMIN 1000 MCG/ML IJ SOLN
1000.0000 ug | Freq: Once | INTRAMUSCULAR | Status: AC
  Administered 2015-02-09: 1000 ug via INTRAMUSCULAR

## 2015-02-17 ENCOUNTER — Encounter: Payer: Self-pay | Admitting: Cardiovascular Disease

## 2015-02-23 ENCOUNTER — Ambulatory Visit (INDEPENDENT_AMBULATORY_CARE_PROVIDER_SITE_OTHER): Payer: BLUE CROSS/BLUE SHIELD | Admitting: Family Medicine

## 2015-02-23 DIAGNOSIS — E538 Deficiency of other specified B group vitamins: Secondary | ICD-10-CM

## 2015-02-23 MED ORDER — CYANOCOBALAMIN 1000 MCG/ML IJ SOLN
1000.0000 ug | Freq: Once | INTRAMUSCULAR | Status: AC
  Administered 2015-02-23: 1000 ug via INTRAMUSCULAR

## 2015-03-06 ENCOUNTER — Ambulatory Visit (INDEPENDENT_AMBULATORY_CARE_PROVIDER_SITE_OTHER): Payer: BLUE CROSS/BLUE SHIELD | Admitting: Family Medicine

## 2015-03-06 DIAGNOSIS — E538 Deficiency of other specified B group vitamins: Secondary | ICD-10-CM

## 2015-03-06 MED ORDER — CYANOCOBALAMIN 1000 MCG/ML IJ SOLN
1000.0000 ug | Freq: Once | INTRAMUSCULAR | Status: AC
  Administered 2015-03-06: 1000 ug via INTRAMUSCULAR

## 2015-03-18 ENCOUNTER — Encounter: Payer: Self-pay | Admitting: Family Medicine

## 2015-03-18 ENCOUNTER — Ambulatory Visit (INDEPENDENT_AMBULATORY_CARE_PROVIDER_SITE_OTHER): Payer: BLUE CROSS/BLUE SHIELD | Admitting: Family Medicine

## 2015-03-18 VITALS — BP 98/60 | Temp 98.6°F | Ht 60.5 in | Wt 88.0 lb

## 2015-03-18 DIAGNOSIS — E538 Deficiency of other specified B group vitamins: Secondary | ICD-10-CM | POA: Diagnosis not present

## 2015-03-18 DIAGNOSIS — L299 Pruritus, unspecified: Secondary | ICD-10-CM

## 2015-03-18 DIAGNOSIS — E785 Hyperlipidemia, unspecified: Secondary | ICD-10-CM

## 2015-03-18 LAB — POCT URINALYSIS DIPSTICK
BILIRUBIN UA: NEGATIVE
Blood, UA: NEGATIVE
Glucose, UA: NEGATIVE
KETONES UA: NEGATIVE
LEUKOCYTES UA: NEGATIVE
Nitrite, UA: NEGATIVE
Protein, UA: NEGATIVE
SPEC GRAV UA: 1.015
Urobilinogen, UA: 0.2
pH, UA: 8

## 2015-03-18 LAB — CBC WITH DIFFERENTIAL/PLATELET
BASOS PCT: 0.7 % (ref 0.0–3.0)
Basophils Absolute: 0 10*3/uL (ref 0.0–0.1)
EOS PCT: 11.5 % — AB (ref 0.0–5.0)
Eosinophils Absolute: 0.5 10*3/uL (ref 0.0–0.7)
HEMATOCRIT: 32.8 % — AB (ref 36.0–46.0)
Hemoglobin: 10.7 g/dL — ABNORMAL LOW (ref 12.0–15.0)
LYMPHS ABS: 2.3 10*3/uL (ref 0.7–4.0)
MCHC: 32.6 g/dL (ref 30.0–36.0)
MCV: 86.2 fl (ref 78.0–100.0)
MONOS PCT: 10.6 % (ref 3.0–12.0)
Monocytes Absolute: 0.4 10*3/uL (ref 0.1–1.0)
NEUTROS ABS: 0.8 10*3/uL — AB (ref 1.4–7.7)
Neutrophils Relative %: 20.4 % — ABNORMAL LOW (ref 43.0–77.0)
PLATELETS: 319 10*3/uL (ref 150.0–400.0)
RBC: 3.81 Mil/uL — ABNORMAL LOW (ref 3.87–5.11)
RDW: 18.9 % — AB (ref 11.5–15.5)
WBC: 4 10*3/uL (ref 4.0–10.5)

## 2015-03-18 LAB — BASIC METABOLIC PANEL
BUN: 7 mg/dL (ref 6–23)
CALCIUM: 9.7 mg/dL (ref 8.4–10.5)
CHLORIDE: 101 meq/L (ref 96–112)
CO2: 31 meq/L (ref 19–32)
Creatinine, Ser: 0.99 mg/dL (ref 0.40–1.20)
GFR: 63.47 mL/min (ref >60.00)
GLUCOSE: 68 mg/dL — AB (ref 70–99)
POTASSIUM: 5.3 meq/L — AB (ref 3.5–5.1)
SODIUM: 138 meq/L (ref 135–145)

## 2015-03-18 LAB — LIPID PANEL
CHOLESTEROL: 135 mg/dL (ref 0–200)
HDL: 32.6 mg/dL — ABNORMAL LOW (ref >39.00)
LDL Cholesterol: 86 mg/dL (ref 0–99)
NONHDL: 102.08
Total CHOL/HDL Ratio: 4
Triglycerides: 78 mg/dL (ref 0.0–149.0)
VLDL: 15.6 mg/dL (ref 0.0–40.0)

## 2015-03-18 LAB — TSH: TSH: 3.72 u[IU]/mL (ref 0.35–4.50)

## 2015-03-18 LAB — HEPATIC FUNCTION PANEL
ALK PHOS: 51 U/L (ref 39–117)
ALT: 17 U/L (ref 0–35)
AST: 21 U/L (ref 0–37)
Albumin: 4.2 g/dL (ref 3.5–5.2)
BILIRUBIN TOTAL: 0.4 mg/dL (ref 0.2–1.2)
Bilirubin, Direct: 0.1 mg/dL (ref 0.0–0.3)
Total Protein: 6.4 g/dL (ref 6.0–8.3)

## 2015-03-18 LAB — VITAMIN B12: Vitamin B-12: 1085 pg/mL — ABNORMAL HIGH (ref 211–911)

## 2015-03-18 MED ORDER — TRIAMCINOLONE ACETONIDE 0.1 % EX CREA: 1.0000 "application " | TOPICAL_CREAM | Freq: Two times a day (BID) | CUTANEOUS | Status: DC

## 2015-03-18 MED ORDER — CYANOCOBALAMIN 1000 MCG/ML IJ SOLN
1000.0000 ug | Freq: Once | INTRAMUSCULAR | Status: AC
  Administered 2015-03-18: 1000 ug via INTRAMUSCULAR

## 2015-03-18 NOTE — Progress Notes (Signed)
Pre visit review using our clinic review tool, if applicable. No additional management support is needed unless otherwise documented below in the visit note. 

## 2015-03-18 NOTE — Addendum Note (Signed)
Addended by: Aniceto Boss A on: 03/18/2015 09:49 AM   Modules accepted: Orders

## 2015-03-18 NOTE — Progress Notes (Signed)
   Subjective:    Patient ID: Emma Palmer, female    DOB: 1966-03-26, 49 y.o.   MRN: [id number]  HPI Here for 2 months of intermittent itching in the shoulders and upper arms. No visible rashes. No insect bites that she knows of. She does get an enormous amount of sun exposure on the arms and legs however. She usually covers her face. She is fasting to have labs drawn.    Review of Systems  Constitutional: Negative.   Respiratory: Negative.   Cardiovascular: Negative.        Objective:   Physical Exam  Constitutional: She appears well-developed and well-nourished.  Cardiovascular: Normal rate, regular rhythm, normal heart sounds and intact distal pulses.   Pulmonary/Chest: Effort normal and breath sounds normal.  Skin:  She has a deep dark tan on the arms and legs, while her face is pale. No visible rashes          Assessment & Plan:  Her itching is most likely due to photosensitivity, so I advised her to stay out of the sun. Get labs today. Use Triamcinolone cream prn

## 2015-03-26 ENCOUNTER — Telehealth: Payer: Self-pay | Admitting: Family Medicine

## 2015-03-26 MED ORDER — FERROUS SULFATE 325 (65 FE) MG PO TABS: 325.0000 mg | ORAL_TABLET | Freq: Two times a day (BID) | ORAL | Status: DC

## 2015-03-26 NOTE — Telephone Encounter (Signed)
I spoke with pt and updated Vitamin B 12 injections in medication list.

## 2015-03-26 NOTE — Telephone Encounter (Signed)
Pt wants to know how often now on the Vitamin B 12 injections, based on recent lab results? She was getting injection once every 2 weeks. Also her right shoulder is still itching badly. She is using the Kenalog cream that was prescribed, however not helping much. Can you recommend something else to try?

## 2015-03-26 NOTE — Addendum Note (Signed)
Addended by: Aniceto Boss A on: 03/26/2015 09:46 AM   Modules accepted: Orders

## 2015-03-26 NOTE — Telephone Encounter (Signed)
We will decrease te B12 shots to once every 4 weeks now. Try alternating the steroid cream with Benadry cream on her shoulder every 4 hours prn

## 2015-03-30 ENCOUNTER — Telehealth: Payer: Self-pay | Admitting: Family Medicine

## 2015-03-30 NOTE — Telephone Encounter (Signed)
I put a copy of lab results in mail and spoke with pt.

## 2015-03-30 NOTE — Telephone Encounter (Signed)
Pt call to ask if a copy of her recent labs and Urinalysis test can be mail to her.

## 2015-04-07 ENCOUNTER — Telehealth: Payer: Self-pay | Admitting: Family Medicine

## 2015-04-07 ENCOUNTER — Ambulatory Visit (INDEPENDENT_AMBULATORY_CARE_PROVIDER_SITE_OTHER): Payer: BLUE CROSS/BLUE SHIELD | Admitting: Family Medicine

## 2015-04-07 ENCOUNTER — Encounter: Payer: Self-pay | Admitting: Family Medicine

## 2015-04-07 VITALS — BP 98/56 | Temp 98.5°F | Ht 60.5 in | Wt 88.0 lb

## 2015-04-07 DIAGNOSIS — I83025 Varicose veins of left lower extremity with ulcer other part of foot: Secondary | ICD-10-CM

## 2015-04-07 DIAGNOSIS — I83029 Varicose veins of left lower extremity with ulcer of unspecified site: Secondary | ICD-10-CM

## 2015-04-07 DIAGNOSIS — I83024 Varicose veins of left lower extremity with ulcer of heel and midfoot: Secondary | ICD-10-CM | POA: Diagnosis not present

## 2015-04-07 DIAGNOSIS — I83022 Varicose veins of left lower extremity with ulcer of calf: Secondary | ICD-10-CM | POA: Diagnosis not present

## 2015-04-07 DIAGNOSIS — I83023 Varicose veins of left lower extremity with ulcer of ankle: Secondary | ICD-10-CM | POA: Diagnosis not present

## 2015-04-07 DIAGNOSIS — I83021 Varicose veins of left lower extremity with ulcer of thigh: Secondary | ICD-10-CM

## 2015-04-07 DIAGNOSIS — L97929 Non-pressure chronic ulcer of unspecified part of left lower leg with unspecified severity: Principal | ICD-10-CM

## 2015-04-07 DIAGNOSIS — I83028 Varicose veins of left lower extremity with ulcer other part of lower leg: Secondary | ICD-10-CM

## 2015-04-07 NOTE — Telephone Encounter (Signed)
Pt needs new rx hydrocodone °

## 2015-04-07 NOTE — Progress Notes (Signed)
Pre visit review using our clinic review tool, if applicable. No additional management support is needed unless otherwise documented below in the visit note. 

## 2015-04-07 NOTE — Progress Notes (Signed)
   Subjective:    Patient ID: Emma Palmer, female    DOB: 1965/08/11, 49 y.o.   MRN: [id number]  HPI Here for 5 days of a tender lump on the left lower leg. No recent trauma.    Review of Systems  Constitutional: Negative.   Respiratory: Negative.   Cardiovascular: Positive for leg swelling. Negative for chest pain and palpitations.  Musculoskeletal: Negative.   Hematological: Negative.        Objective:   Physical Exam  Constitutional: She is oriented to person, place, and time. She appears well-developed and well-nourished. No distress.  Musculoskeletal:  Several small varicose veins in the left leg. One has a small area of swelling and tenderness that looks like a thrombus.   Neurological: She is alert and oriented to person, place, and time.          Assessment & Plan:  This is an area of phlebitis in a superficial leg vein. Elevate, use moist heat, and take Motrin prn.

## 2015-04-08 ENCOUNTER — Other Ambulatory Visit: Payer: Self-pay

## 2015-04-08 MED ORDER — HYDROCODONE-ACETAMINOPHEN 5-325 MG PO TABS: ORAL_TABLET | ORAL | Status: DC

## 2015-04-08 NOTE — Telephone Encounter (Signed)
done

## 2015-04-08 NOTE — Telephone Encounter (Signed)
Pt aware to pick up his rx

## 2015-04-09 NOTE — Telephone Encounter (Signed)
Emma Palmer please close this encounter °

## 2015-04-20 ENCOUNTER — Encounter: Payer: Self-pay | Admitting: Family Medicine

## 2015-04-20 ENCOUNTER — Ambulatory Visit (INDEPENDENT_AMBULATORY_CARE_PROVIDER_SITE_OTHER): Payer: BLUE CROSS/BLUE SHIELD | Admitting: Family Medicine

## 2015-04-20 VITALS — BP 96/60 | Temp 98.4°F | Ht 60.5 in | Wt 87.0 lb

## 2015-04-20 DIAGNOSIS — E538 Deficiency of other specified B group vitamins: Secondary | ICD-10-CM

## 2015-04-20 DIAGNOSIS — F502 Bulimia nervosa: Secondary | ICD-10-CM | POA: Diagnosis not present

## 2015-04-20 MED ORDER — CYANOCOBALAMIN 1000 MCG/ML IJ SOLN
1000.0000 ug | Freq: Once | INTRAMUSCULAR | Status: AC
  Administered 2015-04-20: 1000 ug via INTRAMUSCULAR

## 2015-04-20 NOTE — Addendum Note (Signed)
Addended by: Aniceto Boss A on: 04/20/2015 12:27 PM   Modules accepted: Orders

## 2015-04-20 NOTE — Progress Notes (Signed)
Pre visit review using our clinic review tool, if applicable. No additional management support is needed unless otherwise documented below in the visit note. 

## 2015-04-20 NOTE — Progress Notes (Signed)
   Subjective:    Patient ID: Emma Palmer, female    DOB: January 28, 1966, 49 y.o.   MRN: [id number]  HPI Here to discuss her hx of anemia. She had been taking vitamin B12 shots every 2 weeks and she was feeling well. However we stopped her shots a few weeks ago since her levels were normal. Since then she has been feeling tired and worn down, just like she felt before she started getting the shots. She is also taking iron pills. She has become a de facto vegetarian in the past few months, when she stopped eating meat.    Review of Systems  Constitutional: Positive for fatigue. Negative for activity change, appetite change and unexpected weight change.  Respiratory: Negative.   Cardiovascular: Negative.   Neurological: Negative.        Objective:   Physical Exam  Constitutional: She appears well-developed and well-nourished.  Neck: No thyromegaly present.  Cardiovascular: Normal rate, regular rhythm, normal heart sounds and intact distal pulses.   Pulmonary/Chest: Effort normal and breath sounds normal.  Lymphadenopathy:    She has no cervical adenopathy.          Assessment & Plan:  It seems her fatigue is related to stopping the B12 shots., so we will get back on these every 2 weeks. Stay on iron pills. I encouraged her to start eating chicken and fish again to increase her protein intke.

## 2015-04-22 ENCOUNTER — Other Ambulatory Visit: Payer: Self-pay | Admitting: Family Medicine

## 2015-05-05 ENCOUNTER — Other Ambulatory Visit: Payer: Self-pay | Admitting: Family Medicine

## 2015-05-05 NOTE — Telephone Encounter (Signed)
Call in #30 with 5 rf 

## 2015-05-12 ENCOUNTER — Ambulatory Visit (INDEPENDENT_AMBULATORY_CARE_PROVIDER_SITE_OTHER): Payer: BLUE CROSS/BLUE SHIELD | Admitting: Family Medicine

## 2015-05-12 DIAGNOSIS — E538 Deficiency of other specified B group vitamins: Secondary | ICD-10-CM | POA: Diagnosis not present

## 2015-05-12 MED ORDER — CYANOCOBALAMIN 1000 MCG/ML IJ SOLN
1000.0000 ug | Freq: Once | INTRAMUSCULAR | Status: AC
  Administered 2015-05-12: 1000 ug via INTRAMUSCULAR

## 2015-06-02 ENCOUNTER — Ambulatory Visit (INDEPENDENT_AMBULATORY_CARE_PROVIDER_SITE_OTHER): Payer: BLUE CROSS/BLUE SHIELD | Admitting: Family Medicine

## 2015-06-02 DIAGNOSIS — E538 Deficiency of other specified B group vitamins: Secondary | ICD-10-CM | POA: Diagnosis not present

## 2015-06-02 MED ORDER — CYANOCOBALAMIN 1000 MCG/ML IJ SOLN
1000.0000 ug | Freq: Once | INTRAMUSCULAR | Status: AC
Start: 2015-06-02 — End: 2015-06-02
  Administered 2015-06-02: 1000 ug via INTRAMUSCULAR

## 2015-06-15 ENCOUNTER — Ambulatory Visit (INDEPENDENT_AMBULATORY_CARE_PROVIDER_SITE_OTHER): Payer: BLUE CROSS/BLUE SHIELD | Admitting: Family Medicine

## 2015-06-15 DIAGNOSIS — E538 Deficiency of other specified B group vitamins: Secondary | ICD-10-CM | POA: Diagnosis not present

## 2015-06-15 MED ORDER — CYANOCOBALAMIN 1000 MCG/ML IJ SOLN
1000.0000 ug | Freq: Once | INTRAMUSCULAR | Status: AC
  Administered 2015-06-15: 1000 ug via INTRAMUSCULAR

## 2015-06-29 ENCOUNTER — Telehealth: Payer: Self-pay | Admitting: Family Medicine

## 2015-06-29 MED ORDER — HYDROCODONE-ACETAMINOPHEN 5-325 MG PO TABS: ORAL_TABLET | ORAL | Status: DC

## 2015-06-29 NOTE — Telephone Encounter (Signed)
Scripts are ready for pick up and I left a voice message for pt. 

## 2015-06-29 NOTE — Telephone Encounter (Signed)
Pt need new hydrocodone °

## 2015-06-29 NOTE — Telephone Encounter (Signed)
done

## 2015-07-01 ENCOUNTER — Ambulatory Visit (INDEPENDENT_AMBULATORY_CARE_PROVIDER_SITE_OTHER): Payer: BLUE CROSS/BLUE SHIELD | Admitting: Family Medicine

## 2015-07-01 DIAGNOSIS — E538 Deficiency of other specified B group vitamins: Secondary | ICD-10-CM | POA: Diagnosis not present

## 2015-07-01 MED ORDER — CYANOCOBALAMIN 1000 MCG/ML IJ SOLN
1000.0000 ug | Freq: Once | INTRAMUSCULAR | Status: AC
  Administered 2015-07-01: 1000 ug via INTRAMUSCULAR

## 2015-07-14 ENCOUNTER — Ambulatory Visit (INDEPENDENT_AMBULATORY_CARE_PROVIDER_SITE_OTHER): Payer: BLUE CROSS/BLUE SHIELD | Admitting: Family Medicine

## 2015-07-14 DIAGNOSIS — E538 Deficiency of other specified B group vitamins: Secondary | ICD-10-CM

## 2015-07-14 MED ORDER — CYANOCOBALAMIN 1000 MCG/ML IJ SOLN
1000.0000 ug | Freq: Once | INTRAMUSCULAR | Status: AC
  Administered 2015-07-14: 1000 ug via INTRAMUSCULAR

## 2015-07-31 ENCOUNTER — Encounter: Payer: Self-pay | Admitting: Family Medicine

## 2015-07-31 ENCOUNTER — Ambulatory Visit (INDEPENDENT_AMBULATORY_CARE_PROVIDER_SITE_OTHER): Payer: BLUE CROSS/BLUE SHIELD | Admitting: Family Medicine

## 2015-07-31 VITALS — BP 96/64 | Temp 98.1°F | Ht 60.5 in | Wt 86.0 lb

## 2015-07-31 DIAGNOSIS — B370 Candidal stomatitis: Secondary | ICD-10-CM | POA: Diagnosis not present

## 2015-07-31 DIAGNOSIS — E538 Deficiency of other specified B group vitamins: Secondary | ICD-10-CM

## 2015-07-31 MED ORDER — CYANOCOBALAMIN 1000 MCG/ML IJ SOLN
1000.0000 ug | Freq: Once | INTRAMUSCULAR | Status: AC
  Administered 2015-07-31: 1000 ug via INTRAMUSCULAR

## 2015-07-31 MED ORDER — FLUCONAZOLE 150 MG PO TABS: 150.0000 mg | ORAL_TABLET | Freq: Every day | ORAL | Status: DC | PRN

## 2015-07-31 MED ORDER — NYSTATIN 100000 UNIT/ML MT SUSP: OROMUCOSAL | Status: DC

## 2015-07-31 NOTE — Progress Notes (Signed)
   Subjective:    Patient ID: Emma Palmer, female    DOB: [birth year], 50 y.o.   MRN: [id number]  HPI Here for possible thrush. For the past week she has had a burning irritation with redness and spots of white in the mouth and on the tongue. She has tried [employer] Mouthwash with no effect. She is also one week late for her B12 shot due the office hours being reduced from the recent weather.    Review of Systems  Constitutional: Negative.   HENT: Negative for mouth sores, sore throat and trouble swallowing.   Eyes: Negative.   Respiratory: Negative.        Objective:   Physical Exam  Constitutional: She appears well-developed and well-nourished.  HENT:  Right Ear: External ear normal.  Left Ear: External ear normal.  Nose: Nose normal.  The buccal surfaces, tongue and posterior OP are red with some areas of white exudate   Eyes: Conjunctivae are normal.  Neck: No thyromegaly present.  Pulmonary/Chest: Effort normal and breath sounds normal.  Lymphadenopathy:    She has no cervical adenopathy.          Assessment & Plan:  Thrush. Treat with Nystatin oral suspension and Diflucan tablets

## 2015-07-31 NOTE — Progress Notes (Signed)
Pre visit review using our clinic review tool, if applicable. No additional management support is needed unless otherwise documented below in the visit note. 

## 2015-08-12 ENCOUNTER — Encounter: Payer: Self-pay | Admitting: Family Medicine

## 2015-08-12 ENCOUNTER — Ambulatory Visit (INDEPENDENT_AMBULATORY_CARE_PROVIDER_SITE_OTHER): Payer: BLUE CROSS/BLUE SHIELD | Admitting: Family Medicine

## 2015-08-12 VITALS — BP 94/62 | HR 61 | Temp 98.5°F | Ht 60.5 in | Wt 85.0 lb

## 2015-08-12 DIAGNOSIS — K121 Other forms of stomatitis: Secondary | ICD-10-CM | POA: Diagnosis not present

## 2015-08-12 DIAGNOSIS — E538 Deficiency of other specified B group vitamins: Secondary | ICD-10-CM

## 2015-08-12 MED ORDER — CYANOCOBALAMIN 1000 MCG/ML IJ SOLN
1000.0000 ug | Freq: Once | INTRAMUSCULAR | Status: AC
  Administered 2015-08-12: 1000 ug via INTRAMUSCULAR

## 2015-08-12 NOTE — Addendum Note (Signed)
Addended by: Aniceto Boss A on: 08/12/2015 03:41 PM   Modules accepted: Orders

## 2015-08-12 NOTE — Progress Notes (Signed)
Pre visit review using our clinic review tool, if applicable. No additional management support is needed unless otherwise documented below in the visit note. 

## 2015-08-12 NOTE — Progress Notes (Signed)
   Subjective:    Patient ID: Emma Palmer, female    DOB: 09-Mar-1966, 50 y.o.   MRN: [id number]  HPI Here for continued redness and irritation in the mouth. This started several months ago but it has really bothered her the past few weeks. She has tried Pharmacist, community Mouthwash, Nystatin suspension, and even Diflucan with no improvement. She finds salt water rinses help more than anything. She asks if she could have some metabolic condition. She had lab work last August which was mostly unremarkable, but her eosinophil count was elevated to 11.5%.    Review of Systems  Constitutional: Negative.   HENT: Positive for mouth sores. Negative for congestion, dental problem, drooling, postnasal drip and sore throat.   Eyes: Negative.   Respiratory: Negative.        Objective:   Physical Exam  Constitutional: She appears well-developed and well-nourished.  HENT:  Right Ear: External ear normal.  Left Ear: External ear normal.  Nose: Nose normal.  The hard palette is a bit red with scattered petechiae, the uvula is a bit swollen and red. No exudate seen          Assessment & Plan:  Stomatitis, it is unclear as to whether this is allergic in nature or possibly a form of connective tissue disease like Behcet's syndrome.  We will refer her to Allergy and to ENT to evaluate

## 2015-09-01 ENCOUNTER — Ambulatory Visit (INDEPENDENT_AMBULATORY_CARE_PROVIDER_SITE_OTHER): Payer: BLUE CROSS/BLUE SHIELD | Admitting: Family Medicine

## 2015-09-01 DIAGNOSIS — E538 Deficiency of other specified B group vitamins: Secondary | ICD-10-CM | POA: Diagnosis not present

## 2015-09-01 MED ORDER — CYANOCOBALAMIN 1000 MCG/ML IJ SOLN
1000.0000 ug | Freq: Once | INTRAMUSCULAR | Status: AC
  Administered 2015-09-01: 1000 ug via INTRAMUSCULAR

## 2015-09-15 ENCOUNTER — Ambulatory Visit (INDEPENDENT_AMBULATORY_CARE_PROVIDER_SITE_OTHER): Payer: BLUE CROSS/BLUE SHIELD | Admitting: Family Medicine

## 2015-09-15 DIAGNOSIS — E538 Deficiency of other specified B group vitamins: Secondary | ICD-10-CM

## 2015-09-15 MED ORDER — CYANOCOBALAMIN 1000 MCG/ML IJ SOLN
1000.0000 ug | Freq: Once | INTRAMUSCULAR | Status: AC
  Administered 2015-09-15: 1000 ug via INTRAMUSCULAR

## 2015-09-29 ENCOUNTER — Telehealth: Payer: Self-pay | Admitting: Family Medicine

## 2015-09-29 NOTE — Telephone Encounter (Signed)
° ° ° ° ° °  Pt request refill of the following: ° °HYDROcodone-acetaminophen (NORCO/VICODIN) 5-325 MG tablet ° ° °Phamacy: °

## 2015-09-30 MED ORDER — HYDROCODONE-ACETAMINOPHEN 5-325 MG PO TABS: ORAL_TABLET | ORAL | Status: DC

## 2015-09-30 NOTE — Telephone Encounter (Signed)
done

## 2015-10-01 NOTE — Telephone Encounter (Signed)
Scripts are ready for pick up and I spoke with pt. 

## 2015-10-02 ENCOUNTER — Ambulatory Visit (INDEPENDENT_AMBULATORY_CARE_PROVIDER_SITE_OTHER): Payer: BLUE CROSS/BLUE SHIELD | Admitting: Family Medicine

## 2015-10-02 DIAGNOSIS — E538 Deficiency of other specified B group vitamins: Secondary | ICD-10-CM

## 2015-10-02 MED ORDER — CYANOCOBALAMIN 1000 MCG/ML IJ SOLN
1000.0000 ug | Freq: Once | INTRAMUSCULAR | Status: AC
  Administered 2015-10-02: 1000 ug via INTRAMUSCULAR

## 2015-10-15 ENCOUNTER — Ambulatory Visit (INDEPENDENT_AMBULATORY_CARE_PROVIDER_SITE_OTHER): Payer: BLUE CROSS/BLUE SHIELD | Admitting: Family Medicine

## 2015-10-15 DIAGNOSIS — E538 Deficiency of other specified B group vitamins: Secondary | ICD-10-CM | POA: Diagnosis not present

## 2015-10-15 MED ORDER — CYANOCOBALAMIN 1000 MCG/ML IJ SOLN
1000.0000 ug | Freq: Once | INTRAMUSCULAR | Status: AC
  Administered 2015-10-15: 1000 ug via INTRAMUSCULAR

## 2015-10-28 ENCOUNTER — Other Ambulatory Visit: Payer: Self-pay | Admitting: Family Medicine

## 2015-11-02 NOTE — Telephone Encounter (Signed)
Pt following up on temazepam (RESTORIL) 30 MG capsule Pt states it is due today.

## 2015-11-02 NOTE — Telephone Encounter (Signed)
Call in #30 with 5 rf 

## 2015-11-02 NOTE — Telephone Encounter (Signed)
Pharmacy called to ck on the refill of Rx.

## 2015-11-03 ENCOUNTER — Ambulatory Visit (INDEPENDENT_AMBULATORY_CARE_PROVIDER_SITE_OTHER): Payer: BLUE CROSS/BLUE SHIELD | Admitting: Family Medicine

## 2015-11-03 DIAGNOSIS — E538 Deficiency of other specified B group vitamins: Secondary | ICD-10-CM

## 2015-11-03 MED ORDER — CYANOCOBALAMIN 1000 MCG/ML IJ SOLN
1000.0000 ug | Freq: Once | INTRAMUSCULAR | Status: AC
  Administered 2015-11-03: 1000 ug via INTRAMUSCULAR

## 2015-11-18 ENCOUNTER — Ambulatory Visit (INDEPENDENT_AMBULATORY_CARE_PROVIDER_SITE_OTHER): Payer: BLUE CROSS/BLUE SHIELD | Admitting: Family Medicine

## 2015-11-18 DIAGNOSIS — E538 Deficiency of other specified B group vitamins: Secondary | ICD-10-CM | POA: Diagnosis not present

## 2015-11-18 MED ORDER — CYANOCOBALAMIN 1000 MCG/ML IJ SOLN
1000.0000 ug | Freq: Once | INTRAMUSCULAR | Status: AC
  Administered 2015-11-18: 1000 ug via INTRAMUSCULAR

## 2015-11-23 ENCOUNTER — Telehealth: Payer: Self-pay | Admitting: Family Medicine

## 2015-11-23 DIAGNOSIS — D539 Nutritional anemia, unspecified: Secondary | ICD-10-CM

## 2015-11-23 NOTE — Telephone Encounter (Signed)
No it is not too early. Have her make a lab appt to recheck her anemia. That was the only thing abnormal last time. I will check a CBC and levels for b12 and iron.

## 2015-11-23 NOTE — Telephone Encounter (Signed)
Pt would like to know if it is to early to do a blood panel since August?  And very fatigue pls call after 12:00 11/24/15

## 2015-11-24 NOTE — Telephone Encounter (Signed)
Pt has been scheduled.  °

## 2015-11-24 NOTE — Telephone Encounter (Signed)
Left message to call back  

## 2015-11-24 NOTE — Telephone Encounter (Signed)
Can you call pt to schedule lab appointment? 

## 2015-11-26 ENCOUNTER — Other Ambulatory Visit: Payer: BLUE CROSS/BLUE SHIELD

## 2015-12-03 ENCOUNTER — Other Ambulatory Visit (INDEPENDENT_AMBULATORY_CARE_PROVIDER_SITE_OTHER): Payer: BLUE CROSS/BLUE SHIELD

## 2015-12-03 ENCOUNTER — Ambulatory Visit (INDEPENDENT_AMBULATORY_CARE_PROVIDER_SITE_OTHER): Payer: BLUE CROSS/BLUE SHIELD | Admitting: Family Medicine

## 2015-12-03 ENCOUNTER — Other Ambulatory Visit: Payer: BLUE CROSS/BLUE SHIELD

## 2015-12-03 DIAGNOSIS — E538 Deficiency of other specified B group vitamins: Secondary | ICD-10-CM | POA: Diagnosis not present

## 2015-12-03 DIAGNOSIS — D539 Nutritional anemia, unspecified: Secondary | ICD-10-CM | POA: Diagnosis not present

## 2015-12-03 LAB — CBC WITH DIFFERENTIAL/PLATELET
BASOS PCT: 0.9 % (ref 0.0–3.0)
Basophils Absolute: 0 10*3/uL (ref 0.0–0.1)
EOS ABS: 0.3 10*3/uL (ref 0.0–0.7)
EOS PCT: 5.2 % — AB (ref 0.0–5.0)
HCT: 37.1 % (ref 36.0–46.0)
Hemoglobin: 12.3 g/dL (ref 12.0–15.0)
LYMPHS ABS: 2.2 10*3/uL (ref 0.7–4.0)
Lymphocytes Relative: 44.3 % (ref 12.0–46.0)
MCHC: 33.3 g/dL (ref 30.0–36.0)
MCV: 95.9 fl (ref 78.0–100.0)
MONO ABS: 0.5 10*3/uL (ref 0.1–1.0)
Monocytes Relative: 10.4 % (ref 3.0–12.0)
NEUTROS PCT: 39.2 % — AB (ref 43.0–77.0)
Neutro Abs: 1.9 10*3/uL (ref 1.4–7.7)
Platelets: 312 10*3/uL (ref 150.0–400.0)
RBC: 3.87 Mil/uL (ref 3.87–5.11)
RDW: 14.8 % (ref 11.5–15.5)
WBC: 4.9 10*3/uL (ref 4.0–10.5)

## 2015-12-03 LAB — FERRITIN: Ferritin: 9.5 ng/mL — ABNORMAL LOW (ref 10.0–291.0)

## 2015-12-03 LAB — IRON: Iron: 81 ug/dL (ref 42–145)

## 2015-12-03 LAB — FOLATE: FOLATE: 12.3 ng/mL (ref >5.9)

## 2015-12-03 LAB — VITAMIN B12: VITAMIN B 12: 1059 pg/mL — AB (ref 211–911)

## 2015-12-03 MED ORDER — CYANOCOBALAMIN 1000 MCG/ML IJ SOLN
1000.0000 ug | Freq: Once | INTRAMUSCULAR | Status: AC
  Administered 2015-12-03: 1000 ug via INTRAMUSCULAR

## 2015-12-09 ENCOUNTER — Encounter: Payer: Self-pay | Admitting: Family Medicine

## 2015-12-10 NOTE — Telephone Encounter (Signed)
First, yes she can continue with getting B12 shots every 2 weeks like she does now. Second, her slightly low neutrophils are very common and should have no effect on her health. This has no effect on H. Pylori infections. Her eosinophils are a little high because happens in many people with allergy tendencies, nothing to worry about.

## 2015-12-18 ENCOUNTER — Ambulatory Visit (INDEPENDENT_AMBULATORY_CARE_PROVIDER_SITE_OTHER): Payer: BLUE CROSS/BLUE SHIELD | Admitting: Family Medicine

## 2015-12-18 DIAGNOSIS — E538 Deficiency of other specified B group vitamins: Secondary | ICD-10-CM | POA: Diagnosis not present

## 2015-12-18 MED ORDER — CYANOCOBALAMIN 1000 MCG/ML IJ SOLN
1000.0000 ug | Freq: Once | INTRAMUSCULAR | Status: AC
  Administered 2015-12-18: 1000 ug via INTRAMUSCULAR

## 2015-12-31 ENCOUNTER — Telehealth: Payer: Self-pay | Admitting: Family Medicine

## 2015-12-31 NOTE — Telephone Encounter (Signed)
° ° ° ° ° °  Pt request refill of the following: ° °HYDROcodone-acetaminophen (NORCO/VICODIN) 5-325 MG tablet ° ° °Phamacy: °

## 2016-01-01 ENCOUNTER — Ambulatory Visit (INDEPENDENT_AMBULATORY_CARE_PROVIDER_SITE_OTHER): Payer: BLUE CROSS/BLUE SHIELD | Admitting: Family Medicine

## 2016-01-01 DIAGNOSIS — E538 Deficiency of other specified B group vitamins: Secondary | ICD-10-CM

## 2016-01-01 MED ORDER — CYANOCOBALAMIN 1000 MCG/ML IJ SOLN
1000.0000 ug | Freq: Once | INTRAMUSCULAR | Status: AC
  Administered 2016-01-01: 1000 ug via INTRAMUSCULAR

## 2016-01-01 MED ORDER — HYDROCODONE-ACETAMINOPHEN 5-325 MG PO TABS: ORAL_TABLET | ORAL | Status: DC

## 2016-01-01 NOTE — Telephone Encounter (Signed)
done

## 2016-01-01 NOTE — Telephone Encounter (Signed)
Pt is coming in today for nurse visit and will pick up then.

## 2016-01-15 ENCOUNTER — Ambulatory Visit (INDEPENDENT_AMBULATORY_CARE_PROVIDER_SITE_OTHER): Payer: BLUE CROSS/BLUE SHIELD | Admitting: Family Medicine

## 2016-01-15 DIAGNOSIS — E538 Deficiency of other specified B group vitamins: Secondary | ICD-10-CM | POA: Diagnosis not present

## 2016-01-15 MED ORDER — CYANOCOBALAMIN 1000 MCG/ML IJ SOLN
1000.0000 ug | Freq: Once | INTRAMUSCULAR | Status: AC
  Administered 2016-01-15: 1000 ug via INTRAMUSCULAR

## 2016-01-29 ENCOUNTER — Ambulatory Visit (INDEPENDENT_AMBULATORY_CARE_PROVIDER_SITE_OTHER): Payer: BLUE CROSS/BLUE SHIELD | Admitting: Family Medicine

## 2016-01-29 DIAGNOSIS — E538 Deficiency of other specified B group vitamins: Secondary | ICD-10-CM | POA: Diagnosis not present

## 2016-01-29 MED ORDER — CYANOCOBALAMIN 1000 MCG/ML IJ SOLN
1000.0000 ug | Freq: Once | INTRAMUSCULAR | Status: AC
  Administered 2016-01-29: 1000 ug via INTRAMUSCULAR

## 2016-02-12 ENCOUNTER — Ambulatory Visit (INDEPENDENT_AMBULATORY_CARE_PROVIDER_SITE_OTHER): Payer: BLUE CROSS/BLUE SHIELD | Admitting: Family Medicine

## 2016-02-12 DIAGNOSIS — E538 Deficiency of other specified B group vitamins: Secondary | ICD-10-CM

## 2016-02-12 MED ORDER — CYANOCOBALAMIN 1000 MCG/ML IJ SOLN
1000.0000 ug | Freq: Once | INTRAMUSCULAR | Status: AC
  Administered 2016-02-12: 1000 ug via INTRAMUSCULAR

## 2016-02-15 ENCOUNTER — Ambulatory Visit: Payer: BLUE CROSS/BLUE SHIELD | Admitting: Cardiovascular Disease

## 2016-02-18 ENCOUNTER — Ambulatory Visit: Payer: BLUE CROSS/BLUE SHIELD | Admitting: Cardiology

## 2016-02-25 ENCOUNTER — Ambulatory Visit (INDEPENDENT_AMBULATORY_CARE_PROVIDER_SITE_OTHER): Payer: BLUE CROSS/BLUE SHIELD | Admitting: Family Medicine

## 2016-02-25 DIAGNOSIS — E538 Deficiency of other specified B group vitamins: Secondary | ICD-10-CM | POA: Diagnosis not present

## 2016-02-25 MED ORDER — CYANOCOBALAMIN 1000 MCG/ML IJ SOLN
1000.0000 ug | Freq: Once | INTRAMUSCULAR | Status: AC
  Administered 2016-02-25: 1000 ug via INTRAMUSCULAR

## 2016-03-09 ENCOUNTER — Ambulatory Visit (INDEPENDENT_AMBULATORY_CARE_PROVIDER_SITE_OTHER): Payer: BLUE CROSS/BLUE SHIELD | Admitting: Family Medicine

## 2016-03-09 DIAGNOSIS — E538 Deficiency of other specified B group vitamins: Secondary | ICD-10-CM

## 2016-03-09 MED ORDER — CYANOCOBALAMIN 1000 MCG/ML IJ SOLN
1000.0000 ug | Freq: Once | INTRAMUSCULAR | Status: AC
  Administered 2016-03-09: 1000 ug via INTRAMUSCULAR

## 2016-03-15 ENCOUNTER — Encounter: Payer: Self-pay | Admitting: Family Medicine

## 2016-03-16 NOTE — Telephone Encounter (Signed)
Even though her iron level was borderline low in May, her overall Hgb was fine. I would advise her to stop taking the iron and have us recheck a level in about 3 months. Let's do her complete lab work here with us

## 2016-03-17 ENCOUNTER — Encounter: Payer: Self-pay | Admitting: Physician Assistant

## 2016-03-17 ENCOUNTER — Ambulatory Visit (INDEPENDENT_AMBULATORY_CARE_PROVIDER_SITE_OTHER): Payer: BLUE CROSS/BLUE SHIELD | Admitting: Physician Assistant

## 2016-03-17 VITALS — BP 110/68 | HR 78 | Temp 98.7°F | Resp 18 | Ht 60.5 in | Wt 88.6 lb

## 2016-03-17 DIAGNOSIS — Z23 Encounter for immunization: Secondary | ICD-10-CM

## 2016-03-17 DIAGNOSIS — L720 Epidermal cyst: Secondary | ICD-10-CM

## 2016-03-17 NOTE — Progress Notes (Signed)
   Emma Palmer  MRN: [id number] DOB: April 18, 1966  Subjective:  Emma Palmer is a 50 y.o. female seen in office today for a chief complaint of pimple in left groin x 1 week. Has associated pain, redness, and warmth. Denies itching,  fever, pus drainage, and chills. Pt does shave in this area. Pt has tried to pop it herself but only got bloody discharge. Pt has history of MRSA in the same area five years ago.   Review of Systems Per HPI   Patient Active Problem List   Diagnosis Date Noted  . OAB (overactive bladder) 06/20/2014  . GERD (gastroesophageal reflux disease) 04/15/2014  . Vitamin B12 deficiency 02/10/2014  . Bulimia nervosa 02/10/2014  . ANXIETY STATE, UNSPECIFIED 04/09/2010  . SOLITARY BONE CYST 08/31/2007  . Hyperlipidemia 11/15/2006  . DEPRESSION 11/15/2006  . ARTHRITIS, RIGHT HIP 11/15/2006  . OSTEOPOROSIS 11/15/2006    Current Outpatient Prescriptions on File Prior to Visit  Medication Sig Dispense Refill  . HYDROcodone-acetaminophen (NORCO/VICODIN) 5-325 MG tablet TAKE 1 TABLET BY MOUTH EVERY 6 HOURS AS NEEDED FOR PAIN 60 tablet 0  . temazepam (RESTORIL) 30 MG capsule Take 1 capsule (30 mg total) by mouth at bedtime as needed for sleep. 30 capsule 5  . nystatin (MYCOSTATIN) 100000 UNIT/ML suspension TAKE 1 TEASPOONFUL BY MOUTH EVERY 4 HOURS AS NEEDED SWISH IN MOUTH (Patient not taking: Reported on 03/17/2016) 300 mL 2  . temazepam (RESTORIL) 30 MG capsule TAKE ONE CAPSULE BY MOUTH AT BEDTIME (Patient not taking: Reported on 03/17/2016) 30 capsule 5   No current facility-administered medications on file prior to visit.     Allergies  Allergen Reactions  . Other     Dioxide hyclate    Objective:  BP 110/68 (BP Location: Right Arm, Patient Position: Sitting, Cuff Size: Small)   Pulse 78   Temp 98.7 F (37.1 C) (Oral)   Resp 18   Ht 5' 0.5" (1.537 m)   Wt 88 lb 9.6 oz (40.2 kg)   SpO2 99%   BMI 17.02 kg/m   Physical Exam  Constitutional: She is oriented  to person, place, and time and well-developed, well-nourished, and in no distress.  HENT:  Head: Normocephalic and atraumatic.  Eyes: Conjunctivae are normal.  Neck: Normal range of motion.  Pulmonary/Chest: Effort normal.  Genitourinary:  Genitourinary Comments: 0.5 cm nodule with with central punctate noted at inferior aspect of left labia majora. No erythema or warmth noted.   Neurological: She is alert and oriented to person, place, and time. Gait normal.  Skin: Skin is warm and dry.  Psychiatric: Affect normal.  Vitals reviewed.  PROCEDURE NOTE: I&D of Sebaceous Cyst  Verbal consent obtained. Local anesthesia with 2cc of 1% lidocaine without epinephrine. Site cleansed with Betadine.  Incision of 0.25cm was made using a 11 blade, discharge of cyst like material, serosanguinous fluid, and blood expressed. Wound cavity was explored with forceps. Cleansed and given pad.  After care instructions provided.    Assessment and Plan :  1. Inclusion cyst -I&D performed  -Does not appear infected; use warm compress to affected area 4-5 x day and clean with soap and water.  -Return to clinic if symptoms worsen, do not improve, or as needed  2. Need for Tdap vaccination - Tdap vaccine greater than or equal to 7yo IM  Benjiman CoreBrittany Holton Sidman PA-C  [facility] 03/17/2016 4:19 PM

## 2016-03-17 NOTE — Patient Instructions (Addendum)
  WOUND CARE. Marland Kitchen. Continue daily cleansing with soap and water. -Do warm compresses 4-5 x day for 20-30 min at a ttime  . Notify the office if you experience any of the following signs of infection: Swelling, redness, pus drainage, streaking, fever >101.0 F . Notify the office if you experience excessive bleeding that does not stop after 15-20 minutes of constant, firm pressure.     IF you received an x-ray today, you will receive an invoice from [facility]. Please contact [facility] at [phone] with questions or concerns regarding your invoice.   IF you received labwork today, you will receive an invoice from [facility]. Please contact [facility] at [phone] with questions or concerns regarding your invoice.   Our billing staff will not be able to assist you with questions regarding bills from these companies.  You will be contacted with the lab results as soon as they are available. The fastest way to get your results is to activate your My Chart account. Instructions are located on the last page of this paperwork. If you have not heard from us regarding the results in 2 weeks, please contact this office.

## 2016-03-25 ENCOUNTER — Ambulatory Visit (INDEPENDENT_AMBULATORY_CARE_PROVIDER_SITE_OTHER): Payer: BLUE CROSS/BLUE SHIELD | Admitting: Family Medicine

## 2016-03-25 ENCOUNTER — Telehealth: Payer: Self-pay | Admitting: Family Medicine

## 2016-03-25 DIAGNOSIS — E785 Hyperlipidemia, unspecified: Secondary | ICD-10-CM

## 2016-03-25 DIAGNOSIS — E538 Deficiency of other specified B group vitamins: Secondary | ICD-10-CM

## 2016-03-25 MED ORDER — CYANOCOBALAMIN 1000 MCG/ML IJ SOLN
1000.0000 ug | Freq: Once | INTRAMUSCULAR | Status: AC
  Administered 2016-03-25: 1000 ug via INTRAMUSCULAR

## 2016-03-25 NOTE — Telephone Encounter (Signed)
Pt would like to know if she should have labs done soon before seeing cardiologist in November? Is she having physical type labs done?

## 2016-03-25 NOTE — Telephone Encounter (Signed)
I ordered some future labs to get first

## 2016-04-01 NOTE — Telephone Encounter (Signed)
I would need to examine her to have any idea what this is

## 2016-04-01 NOTE — Telephone Encounter (Signed)
I sent pt a my chart message, see below note.

## 2016-04-06 ENCOUNTER — Telehealth: Payer: Self-pay | Admitting: Family Medicine

## 2016-04-06 NOTE — Telephone Encounter (Signed)
Pt need new Rx for Hydrocodone °

## 2016-04-07 ENCOUNTER — Encounter: Payer: Self-pay | Admitting: Family Medicine

## 2016-04-07 ENCOUNTER — Ambulatory Visit: Payer: BLUE CROSS/BLUE SHIELD | Admitting: Family Medicine

## 2016-04-07 ENCOUNTER — Ambulatory Visit (INDEPENDENT_AMBULATORY_CARE_PROVIDER_SITE_OTHER): Payer: BLUE CROSS/BLUE SHIELD | Admitting: Family Medicine

## 2016-04-07 VITALS — BP 96/62 | HR 63 | Temp 98.0°F | Ht 60.5 in | Wt 87.0 lb

## 2016-04-07 DIAGNOSIS — L309 Dermatitis, unspecified: Secondary | ICD-10-CM

## 2016-04-07 DIAGNOSIS — E538 Deficiency of other specified B group vitamins: Secondary | ICD-10-CM | POA: Diagnosis not present

## 2016-04-07 MED ORDER — HYDROCODONE-ACETAMINOPHEN 5-325 MG PO TABS: ORAL_TABLET | ORAL | 0 refills | Status: DC

## 2016-04-07 MED ORDER — CYANOCOBALAMIN 1000 MCG/ML IJ SOLN
1000.0000 ug | Freq: Once | INTRAMUSCULAR | Status: AC
  Administered 2016-04-07: 1000 ug via INTRAMUSCULAR

## 2016-04-07 NOTE — Progress Notes (Signed)
Pre visit review using our clinic review tool, if applicable. No additional management support is needed unless otherwise documented below in the visit note. 

## 2016-04-07 NOTE — Telephone Encounter (Signed)
done

## 2016-04-07 NOTE — Progress Notes (Signed)
   Subjective:    Patient ID: Emma KernsKaren P Nwosu, female    DOB: July 02, 1966, 50 y.o.   MRN: [id number]  HPI Here asking about patches of scaly dry skin on the arms that come and go. These started appearing about 2 months ago. She has tried moisturizers and cortisone creams with no response, but ice seems to help.   Review of Systems  Constitutional: Negative.   Skin: Positive for rash.       Objective:   Physical Exam  Constitutional: She appears well-developed and well-nourished.  Skin:  Today the skin on her arms is clear, although some superficial excoriations are present          Assessment & Plan:  Dry skin, probably eczema. I suggested she use a humidifier in her home. Recheck prn. Nelwyn SalisburyFRY,STEPHEN A, MD

## 2016-04-08 ENCOUNTER — Ambulatory Visit: Payer: BLUE CROSS/BLUE SHIELD | Admitting: Family Medicine

## 2016-04-08 ENCOUNTER — Ambulatory Visit: Payer: BLUE CROSS/BLUE SHIELD | Admitting: Cardiology

## 2016-04-08 NOTE — Telephone Encounter (Signed)
Pt was seen here on 04/07/2016 and picked up script then.

## 2016-04-19 ENCOUNTER — Encounter: Payer: Self-pay | Admitting: Family Medicine

## 2016-04-19 DIAGNOSIS — D509 Iron deficiency anemia, unspecified: Secondary | ICD-10-CM

## 2016-04-21 ENCOUNTER — Ambulatory Visit: Payer: BLUE CROSS/BLUE SHIELD | Admitting: Family Medicine

## 2016-04-21 ENCOUNTER — Other Ambulatory Visit (INDEPENDENT_AMBULATORY_CARE_PROVIDER_SITE_OTHER): Payer: BLUE CROSS/BLUE SHIELD

## 2016-04-21 ENCOUNTER — Ambulatory Visit (INDEPENDENT_AMBULATORY_CARE_PROVIDER_SITE_OTHER): Payer: BLUE CROSS/BLUE SHIELD | Admitting: Family Medicine

## 2016-04-21 DIAGNOSIS — E785 Hyperlipidemia, unspecified: Secondary | ICD-10-CM

## 2016-04-21 DIAGNOSIS — E538 Deficiency of other specified B group vitamins: Secondary | ICD-10-CM

## 2016-04-21 LAB — CBC WITH DIFFERENTIAL/PLATELET
BASOS ABS: 0 10*3/uL (ref 0.0–0.1)
Basophils Relative: 0.8 % (ref 0.0–3.0)
EOS ABS: 0.1 10*3/uL (ref 0.0–0.7)
Eosinophils Relative: 2.7 % (ref 0.0–5.0)
HCT: 40 % (ref 36.0–46.0)
Hemoglobin: 13.4 g/dL (ref 12.0–15.0)
LYMPHS ABS: 1.9 10*3/uL (ref 0.7–4.0)
Lymphocytes Relative: 43.4 % (ref 12.0–46.0)
MCHC: 33.5 g/dL (ref 30.0–36.0)
MCV: 98 fl (ref 78.0–100.0)
MONO ABS: 0.4 10*3/uL (ref 0.1–1.0)
MONOS PCT: 10 % (ref 3.0–12.0)
NEUTROS ABS: 1.8 10*3/uL (ref 1.4–7.7)
NEUTROS PCT: 43.1 % (ref 43.0–77.0)
PLATELETS: 281 10*3/uL (ref 150.0–400.0)
RBC: 4.08 Mil/uL (ref 3.87–5.11)
RDW: 14 % (ref 11.5–15.5)
WBC: 4.3 10*3/uL (ref 4.0–10.5)

## 2016-04-21 LAB — LIPID PANEL
CHOLESTEROL: 213 mg/dL — AB (ref 0–200)
HDL: 71.4 mg/dL (ref >39.00)
LDL Cholesterol: 125 mg/dL — ABNORMAL HIGH (ref 0–99)
NonHDL: 141.83
Total CHOL/HDL Ratio: 3
Triglycerides: 85 mg/dL (ref 0.0–149.0)
VLDL: 17 mg/dL (ref 0.0–40.0)

## 2016-04-21 LAB — HEPATIC FUNCTION PANEL
ALT: 25 U/L (ref 0–35)
AST: 22 U/L (ref 0–37)
Albumin: 3.9 g/dL (ref 3.5–5.2)
Alkaline Phosphatase: 53 U/L (ref 39–117)
BILIRUBIN DIRECT: 0.1 mg/dL (ref 0.0–0.3)
TOTAL PROTEIN: 6 g/dL (ref 6.0–8.3)
Total Bilirubin: 0.7 mg/dL (ref 0.2–1.2)

## 2016-04-21 LAB — TSH: TSH: 2.09 u[IU]/mL (ref 0.35–4.50)

## 2016-04-21 LAB — BASIC METABOLIC PANEL
BUN: 4 mg/dL — ABNORMAL LOW (ref 6–23)
CALCIUM: 9 mg/dL (ref 8.4–10.5)
CHLORIDE: 104 meq/L (ref 96–112)
CO2: 32 meq/L (ref 19–32)
Creatinine, Ser: 0.88 mg/dL (ref 0.40–1.20)
GFR: 72.39 mL/min (ref >60.00)
GLUCOSE: 74 mg/dL (ref 70–99)
POTASSIUM: 3.9 meq/L (ref 3.5–5.1)
SODIUM: 143 meq/L (ref 135–145)

## 2016-04-21 MED ORDER — CYANOCOBALAMIN 1000 MCG/ML IJ SOLN
1000.0000 ug | Freq: Once | INTRAMUSCULAR | Status: AC
  Administered 2016-04-21: 1000 ug via INTRAMUSCULAR

## 2016-04-22 ENCOUNTER — Other Ambulatory Visit: Payer: Self-pay | Admitting: Family Medicine

## 2016-04-25 NOTE — Telephone Encounter (Signed)
We did not get a ferritin but her Hgb is normal. I recommend staying on the iron infusions if she can because they work so well. We did not get a vitamin D, we can get that next time. The liver panel was normal.

## 2016-04-25 NOTE — Telephone Encounter (Signed)
Call in #30 with 5 rf 

## 2016-04-26 NOTE — Telephone Encounter (Signed)
Rx called in as directed.   

## 2016-04-28 ENCOUNTER — Ambulatory Visit: Payer: BLUE CROSS/BLUE SHIELD | Admitting: Family Medicine

## 2016-04-28 NOTE — Telephone Encounter (Signed)
She may be a candidate for iron infusions. I will refer her to Hematology to discuss treatment options

## 2016-05-05 ENCOUNTER — Ambulatory Visit: Payer: BLUE CROSS/BLUE SHIELD | Admitting: Family Medicine

## 2016-05-05 ENCOUNTER — Ambulatory Visit (INDEPENDENT_AMBULATORY_CARE_PROVIDER_SITE_OTHER): Payer: BLUE CROSS/BLUE SHIELD | Admitting: Family Medicine

## 2016-05-05 ENCOUNTER — Other Ambulatory Visit: Payer: BLUE CROSS/BLUE SHIELD

## 2016-05-05 DIAGNOSIS — E538 Deficiency of other specified B group vitamins: Secondary | ICD-10-CM

## 2016-05-05 MED ORDER — CYANOCOBALAMIN 1000 MCG/ML IJ SOLN
1000.0000 ug | Freq: Once | INTRAMUSCULAR | Status: AC
  Administered 2016-05-05: 1000 ug via INTRAMUSCULAR

## 2016-05-11 ENCOUNTER — Telehealth: Payer: Self-pay | Admitting: Hematology and Oncology

## 2016-05-11 ENCOUNTER — Encounter: Payer: Self-pay | Admitting: Hematology and Oncology

## 2016-05-11 NOTE — Telephone Encounter (Signed)
Pt confirmed appt, completed intake, mailed pt letter, in basket referring provider appt date/time.  °

## 2016-05-19 ENCOUNTER — Ambulatory Visit (INDEPENDENT_AMBULATORY_CARE_PROVIDER_SITE_OTHER): Payer: BLUE CROSS/BLUE SHIELD | Admitting: Physician Assistant

## 2016-05-19 VITALS — BP 112/72 | HR 80 | Temp 98.9°F | Resp 17 | Ht 60.5 in | Wt 91.0 lb

## 2016-05-19 DIAGNOSIS — Z711 Person with feared health complaint in whom no diagnosis is made: Secondary | ICD-10-CM | POA: Diagnosis not present

## 2016-05-19 DIAGNOSIS — Z1211 Encounter for screening for malignant neoplasm of colon: Secondary | ICD-10-CM

## 2016-05-19 NOTE — Progress Notes (Signed)
Cardiology Office Note   Date:  05/20/2016   ID:  Emma PotashKaren P Palmer, DOB 12-Jul-1966, MRN [id number]  PCP:  Nelwyn SalisburyFRY,STEPHEN A, MD  Cardiologist:   Rollene RotundaJames Shlonda Dolloff, MD  Referring:  Self  Chief Complaint  Patient presents with  . Dizziness  . Shortness of Breath  . Edema      History of Present Illness: Emma KernsKaren P Palmer is a 50 y.o. female who presents for Cardiac evaluation for tiredness and multiple complaints. She apparently had cardiomegaly some years ago when she was anorexic and bulimic. She was told that her heart shrunk when she was over this.  She has not had other cardiovascular testing. She comes in with 2 pages of complaints but from a cardiac standpoint is a question of whether she could have some vascular disease given family history. She feels presyncopal and fatigue when she is trying to push mower. She feels like she's not getting enough oxygen although she's not particularly short of breath. She feels like she can't get up in the morning and get going. Her legs are tired. She does have some dry ulcers on her second left middle toe and her first great toe on the right. She's not describing chest pressure, neck or arm discomfort. She's not describing palpitations. She doesn't sleep well at night but doesn't have PND or orthopnea. She has had some leg swelling she thinks in her left leg.    Past Medical History:  Diagnosis Date  . Anemia   . Anxiety   . B12 deficiency   . Bulimia   . Depression   . GERD (gastroesophageal reflux disease)   . Glaucoma   . H. pylori infection    + serolgies  . Headache(784.0)   . Hip dysplasia    left - dr supple  . Hyperlipidemia   . Intraocular pressure increase    dr Hazle Quantdigby  . Nephrolithiasis    hx, dr Aldean Astkimbrough  . OP (osteoporosis) 10-24-06   dexa     Past Surgical History:  Procedure Laterality Date  . ESOPHAGOGASTRODUODENOSCOPY  05-06-05   dr Leone Payorgessner -normal including duodenal bxs  . LITHOTRIPSY     stent inserted      Current Outpatient Prescriptions  Medication Sig Dispense Refill  . ferrous sulfate 300 (60 Fe) MG/5ML syrup Take 300 mg by mouth daily.    Marland Kitchen. HYDROcodone-acetaminophen (NORCO/VICODIN) 5-325 MG tablet TAKE 1 TABLET BY MOUTH EVERY 6 HOURS AS NEEDED FOR PAIN 60 tablet 0  . temazepam (RESTORIL) 30 MG capsule TAKE ONE CAPSULE AT BEDTIME 30 capsule 1  . vitamin B-12 (CYANOCOBALAMIN) 1000 MCG tablet Take 1,000 mcg by mouth every 14 (fourteen) days.     No current facility-administered medications for this visit.     Allergies:   Other and Restasis [cyclosporine]    Social History:  The patient  reports that she has been smoking Cigarettes.  She has been smoking about 0.50 packs per day. She has never used smokeless tobacco. She reports that she does not drink alcohol or use drugs.   Family History:  The patient's family history includes Heart attack (age of onset: 159) in her father; Heart disease in her maternal grandmother and paternal grandmother; Hyperlipidemia in her mother; Hypertension in her father; Stroke in her maternal grandfather, maternal grandmother, and mother.    ROS:  Please see the history of present illness.   Otherwise, review of systems are positive for brittle nails, dizziness, light sensitivity, dry eyes, dry itchy skin, depression,  anxiety, generalized weakness.   All other systems are reviewed and negative.    PHYSICAL EXAM: VS:  BP 94/66   Pulse 64   Ht 5' (1.524 m)   Wt 86 lb 12.8 oz (39.4 kg)   LMP 10/27/2010   BMI 16.95 kg/m  , BMI Body mass index is 16.95 kg/m. GENERAL:  Well appearing HEENT:  Pupils equal round and reactive, fundi not visualized, oral mucosa unremarkable NECK:  No jugular venous distention, waveform within normal limits, carotid upstroke brisk and symmetric, no bruits, no thyromegaly LYMPHATICS:  No cervical, inguinal adenopathy LUNGS:  Clear to auscultation bilaterally BACK:  No CVA tenderness CHEST:  Unremarkable HEART:  PMI not  displaced or sustained,S1 and S2 within normal limits, no S3, no S4, no clicks, no rubs, no murmurs ABD:  Flat, positive bowel sounds normal in frequency in pitch, no bruits, no rebound, no guarding, no midline pulsatile mass, no hepatomegaly, no splenomegaly EXT:  2 plus pulses upper and possibly mild decreased dorsalis pedis and posterior tibialis bilateral lower, no edema, no cyanosis no clubbing SKIN:  No rashes no nodules, healed small ulcer on the left second toe and right great toe NEURO:  Cranial nerves II through XII grossly intact, motor grossly intact throughout PSYCH:  Cognitively intact, oriented to person place and time    EKG:  EKG is ordered today. The ekg ordered today demonstrates sinus rhythm, rate 64, right axis deviation, low voltage on the limb and chest leads, no acute ST-T wave changes.   Recent Labs: 04/21/2016: ALT 25; BUN 4; Creatinine, Ser 0.88; Hemoglobin 13.4; Platelets 281.0; Potassium 3.9; Sodium 143; TSH 2.09    Lipid Panel    Component Value Date/Time   CHOL 213 (H) 04/21/2016 0926   TRIG 85.0 04/21/2016 0926   HDL 71.40 04/21/2016 0926   CHOLHDL 3 04/21/2016 0926   VLDL 17.0 04/21/2016 0926   LDLCALC 125 (H) 04/21/2016 0926   LDLDIRECT 155.5 09/19/2012 1034      Wt Readings from Last 3 Encounters:  05/20/16 86 lb 12.8 oz (39.4 kg)  05/19/16 91 lb (41.3 kg)  04/07/16 87 lb (39.5 kg)      Other studies Reviewed: Additional studies/ records that were reviewed today include: Outside labs. Review of the above records demonstrates:  Please see elsewhere in the note.     ASSESSMENT AND PLAN:  FATIGUE:    This is her predominant complaint. It seems to be mostly reproducible with activities 1 minute bring her back for a POET (Plain Old Exercise Treadmill).   This will allow me to screen for obstructive coronary disease, risk stratify and very importantly provide a prescription for exercise.  She does have a strong family history of coronary artery  disease and I think there is a small chance this could be an anginal equivalent.  Of note I did review her outside labs and she was not anemic. Her TSH was normal.    TOE ULCERS:  I don't think she has significant peripheral vascular disease. I'm going to check ABIs. I've also encouraged her to see a rheumatologist.  ABNORMAL EKG:     I will check a BNP level.    Current medicines are reviewed at length with the patient today.  The patient does not have concerns regarding medicines.  The following changes have been made:  no change  Labs/ tests ordered today include:   Orders Placed This Encounter  Procedures  . B Nat Peptide  . EXERCISE TOLERANCE TEST  Disposition:   FU with me in two months    Signed, Rollene RotundaJames Naelani Lafrance, MD  05/20/2016 1:09 PM    [facility]

## 2016-05-19 NOTE — Patient Instructions (Addendum)
Thank you for letting me participate in your health and well being.      IF you received an x-ray today, you will receive an invoice from [facility]. Please contact [facility] at [phone] with questions or concerns regarding your invoice.   IF you received labwork today, you will receive an invoice from [facility]. Please contact [facility] at [phone] with questions or concerns regarding your invoice.   Our billing staff will not be able to assist you with questions regarding bills from these companies.  You will be contacted with the lab results as soon as they are available. The fastest way to get your results is to activate your My Chart account. Instructions are located on the last page of this paperwork. If you have not heard from us regarding the results in 2 weeks, please contact this office.      

## 2016-05-19 NOTE — Progress Notes (Signed)
Emma KernsKaren P Palmer  MRN: [id number] DOB: 22-Sep-1965  Subjective:  Emma KernsKaren P Palmer is a 50 y.o. female seen in office today for a chief complaint of skin lesion above anal opening. She noticed the skin lesion two days ago when she was washing her bottom. Of note, pt takes a bath after every bowel movement due to fear of germs. She is also concerned that she may have felt a blister in this region.She denies  pain, warmth, redness, fever, and diaphoresis.    Review of Systems  Gastrointestinal: Negative for anal bleeding, blood in stool, constipation, diarrhea and rectal pain.    Patient Active Problem List   Diagnosis Date Noted  . OAB (overactive bladder) 06/20/2014  . GERD (gastroesophageal reflux disease) 04/15/2014  . Vitamin B12 deficiency 02/10/2014  . Bulimia nervosa 02/10/2014  . ANXIETY STATE, UNSPECIFIED 04/09/2010  . SOLITARY BONE CYST 08/31/2007  . Hyperlipidemia 11/15/2006  . DEPRESSION 11/15/2006  . ARTHRITIS, RIGHT HIP 11/15/2006  . OSTEOPOROSIS 11/15/2006    Current Outpatient Prescriptions on File Prior to Visit  Medication Sig Dispense Refill  . ferrous sulfate 300 (60 Fe) MG/5ML syrup Take 300 mg by mouth daily.    Marland Kitchen. HYDROcodone-acetaminophen (NORCO/VICODIN) 5-325 MG tablet TAKE 1 TABLET BY MOUTH EVERY 6 HOURS AS NEEDED FOR PAIN 60 tablet 0  . temazepam (RESTORIL) 30 MG capsule Take 1 capsule (30 mg total) by mouth at bedtime as needed for sleep. 30 capsule 5  . temazepam (RESTORIL) 30 MG capsule TAKE ONE CAPSULE BY MOUTH AT BEDTIME 30 capsule 5  . temazepam (RESTORIL) 30 MG capsule TAKE ONE CAPSULE AT BEDTIME 30 capsule 1   No current facility-administered medications on file prior to visit.     Allergies  Allergen Reactions  . Other     Dioxide hyclate    Objective:  BP 112/72 (BP Location: Right Arm, Patient Position: Sitting, Cuff Size: Normal)   Pulse 80   Temp 98.9 F (37.2 C) (Oral)   Resp 17   Ht 5' 0.5" (1.537 m)   Wt 91 lb (41.3 kg)   LMP  10/27/2010   SpO2 98%   BMI 17.48 kg/m   Physical Exam  Constitutional: She is oriented to person, place, and time. She appears malnourished. She does not have a sickly appearance.  HENT:  Head: Normocephalic and atraumatic.  Eyes: Conjunctivae are normal.  Neck: Normal range of motion.  Pulmonary/Chest: Effort normal.  Genitourinary: Vulva normal. Rectal exam shows no external hemorrhoid, no internal hemorrhoid, no fissure, no mass and no tenderness.  Genitourinary Comments: Two areas of looser skin noted at the 12 and 6 o clock position of anus . No change in skin appearance, new lesions, or external hemorrhoids noted. PA English performed a DRE and no internal masses were palpated.   Neurological: She is alert and oriented to person, place, and time. Gait normal.  Skin: Skin is warm and dry.  Psychiatric: Affect normal.  Vitals reviewed.  Assessment and Plan :  1. Feared condition not demonstrated -Skin is consistent with excess skin noted after prior hemorrhoids, which patient has had in the past.  -Encouraged that there were no new lesions/blisters noted. Pt states she will be able to sleep better tonight knowing she does not have anything concerning there because it was hard for her to see.   2. Screen for colon cancer - Ambulatory referral to Gastroenterology; instructed for this to be made once patient turns 51, which is in 2.5 months.  Benjiman CoreBrittany Yovanny Coats PA-C  [facility] 05/19/2016 3:48 PM

## 2016-05-20 ENCOUNTER — Encounter: Payer: Self-pay | Admitting: Cardiology

## 2016-05-20 ENCOUNTER — Ambulatory Visit (INDEPENDENT_AMBULATORY_CARE_PROVIDER_SITE_OTHER): Payer: BLUE CROSS/BLUE SHIELD

## 2016-05-20 ENCOUNTER — Ambulatory Visit (INDEPENDENT_AMBULATORY_CARE_PROVIDER_SITE_OTHER): Payer: BLUE CROSS/BLUE SHIELD | Admitting: Cardiology

## 2016-05-20 VITALS — BP 94/66 | HR 64 | Ht 60.0 in | Wt 86.8 lb

## 2016-05-20 DIAGNOSIS — E538 Deficiency of other specified B group vitamins: Secondary | ICD-10-CM | POA: Diagnosis not present

## 2016-05-20 DIAGNOSIS — L97519 Non-pressure chronic ulcer of other part of right foot with unspecified severity: Secondary | ICD-10-CM | POA: Diagnosis not present

## 2016-05-20 DIAGNOSIS — L97529 Non-pressure chronic ulcer of other part of left foot with unspecified severity: Secondary | ICD-10-CM | POA: Diagnosis not present

## 2016-05-20 DIAGNOSIS — R0600 Dyspnea, unspecified: Secondary | ICD-10-CM

## 2016-05-20 MED ORDER — CYANOCOBALAMIN 1000 MCG/ML IJ SOLN
1000.0000 ug | Freq: Once | INTRAMUSCULAR | Status: AC
  Administered 2016-05-20: 1000 ug via INTRAMUSCULAR

## 2016-05-20 NOTE — Patient Instructions (Signed)
Medication Instructions:  Continue current medications  Labwork: BNP  Testing/Procedures: Your physician has requested that you have an ankle brachial index (ABI). During this test an ultrasound and blood pressure cuff are used to evaluate the arteries that supply the arms and legs with blood. Allow thirty minutes for this exam. There are no restrictions or special instructions.  Your physician has requested that you have an exercise tolerance test. For further information please visit [email or web address]. Please also follow instruction sheet, as given.   Follow-Up: Your physician recommends that you schedule a follow-up appointment in: 2 Months   Any Other Special Instructions Will Be Listed Below (If Applicable).   If you need a refill on your cardiac medications before your next appointment, please call your pharmacy.

## 2016-05-21 LAB — BRAIN NATRIURETIC PEPTIDE: Brain Natriuretic Peptide: 12.7 pg/mL (ref <100)

## 2016-05-25 ENCOUNTER — Telehealth: Payer: Self-pay | Admitting: Cardiology

## 2016-05-25 NOTE — Telephone Encounter (Signed)
F/u Message ° °Pt returning RN call. Please call back to discuss  °

## 2016-05-25 NOTE — Telephone Encounter (Signed)
Spoke w patient. Results of BNP discussed. She is aware that if she has further questions or concerns to call. She voiced understanding and thanks.

## 2016-05-30 NOTE — Addendum Note (Signed)
Addended by: Lorain ChildesPRESSLEY, Jaylinn Hellenbrand A on: 05/30/2016 04:34 PM   Modules accepted: Orders

## 2016-05-31 ENCOUNTER — Telehealth (HOSPITAL_COMMUNITY): Payer: Self-pay

## 2016-05-31 NOTE — Telephone Encounter (Signed)
Will attempt again tomorrow.  Encounter complete.

## 2016-06-01 ENCOUNTER — Telehealth (HOSPITAL_COMMUNITY): Payer: Self-pay

## 2016-06-01 NOTE — Telephone Encounter (Signed)
Encounter complete. 

## 2016-06-02 ENCOUNTER — Ambulatory Visit (INDEPENDENT_AMBULATORY_CARE_PROVIDER_SITE_OTHER): Payer: BLUE CROSS/BLUE SHIELD | Admitting: Family Medicine

## 2016-06-02 ENCOUNTER — Ambulatory Visit (HOSPITAL_COMMUNITY)
Admission: RE | Admit: 2016-06-02 | Discharge: 2016-06-02 | Disposition: A | Discharge status: Home / Self Care | Payer: BLUE CROSS/BLUE SHIELD | Source: Ambulatory Visit | Attending: Cardiology | Admitting: Cardiology

## 2016-06-02 DIAGNOSIS — E538 Deficiency of other specified B group vitamins: Secondary | ICD-10-CM

## 2016-06-02 DIAGNOSIS — R0600 Dyspnea, unspecified: Secondary | ICD-10-CM

## 2016-06-02 LAB — EXERCISE TOLERANCE TEST
CHL CUP MPHR: 171 {beats}/min
CHL CUP RESTING HR STRESS: 63 {beats}/min
CSEPEDS: 1 s
CSEPPHR: 133 {beats}/min
Estimated workload: 13.4 METS
Exercise duration (min): 12 min
Percent HR: 77 %
RPE: 17

## 2016-06-02 MED ORDER — CYANOCOBALAMIN 1000 MCG/ML IJ SOLN
1000.0000 ug | Freq: Once | INTRAMUSCULAR | Status: AC
  Administered 2016-06-02: 1000 ug via INTRAMUSCULAR

## 2016-06-03 ENCOUNTER — Telehealth: Payer: Self-pay | Admitting: Cardiology

## 2016-06-03 ENCOUNTER — Encounter: Payer: Self-pay | Admitting: Cardiology

## 2016-06-03 NOTE — Telephone Encounter (Signed)
Pt wants to know if she should still keep her appointment with the Hematologist for iron infusions? Dr Antoine PocheHochrein thought her hemoglobin was ok.

## 2016-06-03 NOTE — Telephone Encounter (Signed)
Yes.  She needs to ask the hematologist that question.

## 2016-06-06 ENCOUNTER — Encounter: Payer: Self-pay | Admitting: Cardiology

## 2016-06-06 ENCOUNTER — Telehealth: Payer: Self-pay | Admitting: *Deleted

## 2016-06-06 DIAGNOSIS — R0600 Dyspnea, unspecified: Secondary | ICD-10-CM

## 2016-06-06 NOTE — Progress Notes (Signed)
Given a B12 injection

## 2016-06-06 NOTE — Telephone Encounter (Signed)
Called pt letting her know she still need to see the Hemotologist

## 2016-06-06 NOTE — Telephone Encounter (Signed)
Call pt with the result of her stress test, pt is aware she will need to have a Lexiscan Myoview done. Lexiscan ordered and send to scheduler to be schedule

## 2016-06-06 NOTE — Telephone Encounter (Signed)
-----   Message from Rollene RotundaJames Hochrein, MD sent at 06/03/2016  9:06 PM EST ----- Inconclusive POET (Plain Old Exercise Treadmill).  She needs to have a [employer] Myoview.

## 2016-06-07 ENCOUNTER — Encounter: Payer: Self-pay | Admitting: Hematology and Oncology

## 2016-06-07 ENCOUNTER — Ambulatory Visit (HOSPITAL_BASED_OUTPATIENT_CLINIC_OR_DEPARTMENT_OTHER): Payer: BLUE CROSS/BLUE SHIELD | Admitting: Hematology and Oncology

## 2016-06-07 ENCOUNTER — Ambulatory Visit: Payer: BLUE CROSS/BLUE SHIELD

## 2016-06-07 VITALS — BP 101/63 | HR 70 | Temp 98.0°F | Resp 16 | Wt 88.9 lb

## 2016-06-07 DIAGNOSIS — D508 Other iron deficiency anemias: Secondary | ICD-10-CM

## 2016-06-07 DIAGNOSIS — E519 Thiamine deficiency, unspecified: Secondary | ICD-10-CM | POA: Diagnosis not present

## 2016-06-07 DIAGNOSIS — D509 Iron deficiency anemia, unspecified: Secondary | ICD-10-CM | POA: Insufficient documentation

## 2016-06-07 DIAGNOSIS — R5383 Other fatigue: Secondary | ICD-10-CM

## 2016-06-07 LAB — CBC & DIFF AND RETIC
BASO%: 0.4 % (ref 0.0–2.0)
Basophils Absolute: 0 10*3/uL (ref 0.0–0.1)
EOS ABS: 0.2 10*3/uL (ref 0.0–0.5)
EOS%: 2.5 % (ref 0.0–7.0)
HCT: 38.9 % (ref 34.8–46.6)
HEMOGLOBIN: 13.2 g/dL (ref 11.6–15.9)
IMMATURE RETIC FRACT: 4.9 % (ref 1.60–10.00)
LYMPH%: 34.5 % (ref 14.0–49.7)
MCH: 32.4 pg (ref 25.1–34.0)
MCHC: 33.9 g/dL (ref 31.5–36.0)
MCV: 95.6 fL (ref 79.5–101.0)
MONO#: 0.7 10*3/uL (ref 0.1–0.9)
MONO%: 10.3 % (ref 0.0–14.0)
NEUT%: 52.3 % (ref 38.4–76.8)
NEUTROS ABS: 3.7 10*3/uL (ref 1.5–6.5)
PLATELETS: 269 10*3/uL (ref 145–400)
RBC: 4.07 10*6/uL (ref 3.70–5.45)
RDW: 12.9 % (ref 11.2–14.5)
Retic %: 1.25 % (ref 0.70–2.10)
Retic Ct Abs: 50.88 10*3/uL (ref 33.70–90.70)
WBC: 7.1 10*3/uL (ref 3.9–10.3)
lymph#: 2.5 10*3/uL (ref 0.9–3.3)

## 2016-06-07 LAB — IRON AND TIBC
%SAT: 36 % (ref 21–57)
IRON: 84 ug/dL (ref 41–142)
TIBC: 231 ug/dL — ABNORMAL LOW (ref 236–444)
UIBC: 147 ug/dL (ref 120–384)

## 2016-06-07 LAB — FERRITIN: FERRITIN: 49 ng/mL (ref 9–269)

## 2016-06-07 NOTE — Progress Notes (Signed)
[facility] CONSULT NOTE  Patient Care Team: Nelwyn SalisburyStephen A Fry, MD as PCP - General  CHIEF COMPLAINTS/PURPOSE OF CONSULTATION:  Iron deficiency anemia  HISTORY OF PRESENTING ILLNESS:  Emma Palmer 50 y.o. female is here because of recent diagnosis of iron and B-12 deficiency anemias. Patient was feeling severe fatigue symptoms. She had blood work in May 2017 which showed a ferritin of 9. She was then started on liquid iron. Whenever she developed the liquid iron she would get diarrhea. She had taken the iron replacement therapy intermittently. She felt that she was not able to tolerate oral iron. She was also given B-12 injections. Overall her symptoms are improved slightly but she continues to feel severe fatigue. Her primary care physician had checked multiple different things including thyroid function panel which were apparently fine. She is here to discuss the role of intravenous iron therapy. Patient is a vegan and does not eat any meat products.  I reviewed her records extensively and collaborated the history with the patient.  MEDICAL HISTORY:  Past Medical History:  Diagnosis Date  . Anemia   . Anxiety   . B12 deficiency   . Bulimia   . Depression   . GERD (gastroesophageal reflux disease)   . Glaucoma   . H. pylori infection    + serolgies  . Headache(784.0)   . Hip dysplasia    left - dr supple  . Hyperlipidemia   . Intraocular pressure increase    dr Hazle Quantdigby  . Nephrolithiasis    hx, dr Aldean Astkimbrough  . OP (osteoporosis) 10-24-06   dexa     SURGICAL HISTORY: Past Surgical History:  Procedure Laterality Date  . ESOPHAGOGASTRODUODENOSCOPY  05-06-05   dr Leone Payorgessner -normal including duodenal bxs  . LITHOTRIPSY     stent inserted    SOCIAL HISTORY: Social History   Social History  . Marital status: Single    Spouse name: N/A  . Number of children: 0  . Years of education: N/A   Occupational History  . unemployed Unemployed   Social History Main  Topics  . Smoking status: Current Every Day Smoker    Packs/day: 0.50    Types: Cigarettes  . Smokeless tobacco: Never Used     Comment: 1/2 pack or less  . Alcohol use No  . Drug use: No  . Sexual activity: Not on file   Other Topics Concern  . Not on file   Social History Narrative   Lives alone    FAMILY HISTORY: Family History  Problem Relation Age of Onset  . Hyperlipidemia Mother   . Stroke Mother   . Heart attack Father [id number]  . Hypertension Father   . Heart disease Maternal Grandmother   . Stroke Maternal Grandmother   . Stroke Maternal Grandfather   . Heart disease Paternal Grandmother   . Colon cancer Neg Hx     ALLERGIES:  is allergic to other and restasis [cyclosporine].  MEDICATIONS:  Current Outpatient Prescriptions  Medication Sig Dispense Refill  . ferrous sulfate 300 (60 Fe) MG/5ML syrup Take 300 mg by mouth daily.    Marland Kitchen. HYDROcodone-acetaminophen (NORCO/VICODIN) 5-325 MG tablet TAKE 1 TABLET BY MOUTH EVERY 6 HOURS AS NEEDED FOR PAIN 60 tablet 0  . temazepam (RESTORIL) 30 MG capsule TAKE ONE CAPSULE AT BEDTIME 30 capsule 1  . vitamin B-12 (CYANOCOBALAMIN) 1000 MCG tablet Take 1,000 mcg by mouth every 14 (fourteen) days.     No current facility-administered medications for this visit.  REVIEW OF SYSTEMS:   Constitutional: Denies fevers, chills or abnormal night sweats, Complains of severe fatigue Eyes: Denies blurriness of vision, double vision or watery eyes Ears, nose, mouth, throat, and face: Denies mucositis or sore throat Respiratory: Denies cough, dyspnea or wheezes Cardiovascular: Denies palpitation, chest discomfort or lower extremity swelling Gastrointestinal:  Denies nausea, heartburn or change in bowel habits Skin: Denies abnormal skin rashes Lymphatics: Denies new lymphadenopathy or easy bruising Neurological:Denies numbness, tingling or new weaknesses Behavioral/Psych: Mood is stable, no new changes   All other systems were reviewed  with the patient and are negative.  PHYSICAL EXAMINATION: ECOG PERFORMANCE STATUS: 1 - Symptomatic but completely ambulatory  Vitals:   06/07/16 1332  BP: 101/63  Pulse: 70  Resp: 16  Temp: 98 F (36.7 C)   Filed Weights   06/07/16 1332  Weight: 88 lb 14.4 oz (40.3 kg)    GENERAL:alert, no distress and comfortable SKIN: skin color, texture, turgor are normal, no rashes or significant lesions EYES: normal, conjunctiva are pink and non-injected, sclera clear OROPHARYNX:no exudate, no erythema and lips, buccal mucosa, and tongue normal  NECK: supple, thyroid normal size, non-tender, without nodularity LYMPH:  no palpable lymphadenopathy in the cervical, axillary or inguinal LUNGS: clear to auscultation and percussion with normal breathing effort HEART: regular rate & rhythm and no murmurs and no lower extremity edema ABDOMEN:abdomen soft, non-tender and normal bowel sounds Musculoskeletal:no cyanosis of digits and no clubbing  PSYCH: alert & oriented x 3 with fluent speech NEURO: no focal motor/sensory deficits  LABORATORY DATA:  I have reviewed the data as listed Lab Results  Component Value Date   WBC 4.3 04/21/2016   HGB 13.4 04/21/2016   HCT 40.0 04/21/2016   MCV 98.0 04/21/2016   PLT 281.0 04/21/2016   Lab Results  Component Value Date   NA 143 04/21/2016   K 3.9 04/21/2016   CL 104 04/21/2016   CO2 32 04/21/2016    RADIOGRAPHIC STUDIES: I have personally reviewed the radiological reports and agreed with the findings in the report.  ASSESSMENT AND PLAN:  Iron deficiency anemia Iron deficiency anemia diagnosed in May 2017 when she presented with a ferritin of 9. She was put on oral replacement therapy. Whenever she takes oral iron replacement it appears that she gets diarrhea. She also does not think she can tolerate oral iron therapy. She is a vegan and does not eat any meat proximal.  Differential diagnosis: Blood loss versus decreased dietary iron versus  malabsorption. Patient is a week and and does not eat any animal protein Anaprox. It is very possible that this could be related to inadequate oral iron intake.  Patient has not noticed any blood loss from the uterus or bowels.  Recommendation: 1. CBC with differential and reticulocyte count.  2. iron studies with ferritin 3. Celiac antibody panel to rule out celiac disease malabsorption  B-12 deficiency: Patient uses B 12 injections with her primary care physician. Severe fatigue: I would like to obtain cortisol levels and today's blood work.  Return to clinic in one week to discuss results.   All questions were answered. The patient knows to call the clinic with any problems, questions or concerns.    Sabas SousGudena, Jethro Radke K, MD 06/07/16

## 2016-06-07 NOTE — Assessment & Plan Note (Signed)
Iron deficiency anemia diagnosed in May 2017 when she presented with a ferritin of 9. She was put on oral replacement therapy. Whenever she takes oral iron replacement it appears that she gets diarrhea. She also does not think she can tolerate oral iron therapy.  Differential diagnosis: Blood loss versus decreased dietary iron versus malabsorption. Patient is a week and and does not eat any animal protein Anaprox. It is very possible that this could be related to inadequate oral iron intake.  Patient has not noticed any blood loss from the uterus or bowels.  Recommendation: 1. CBC with differential and reticulocyte count.  2. iron studies with ferritin 3. Celiac antibody panel to rule out celiac disease malabsorption  B-12 deficiency: Patient uses B 12 injections with her primary care physician. Severe fatigue: I would like to obtain cortisol levels and today's blood work.  Return to clinic in one week to discuss results.

## 2016-06-08 ENCOUNTER — Other Ambulatory Visit: Payer: Self-pay | Admitting: Cardiology

## 2016-06-08 DIAGNOSIS — L97519 Non-pressure chronic ulcer of other part of right foot with unspecified severity: Secondary | ICD-10-CM

## 2016-06-08 DIAGNOSIS — L97529 Non-pressure chronic ulcer of other part of left foot with unspecified severity: Principal | ICD-10-CM

## 2016-06-08 LAB — CORTISOL: CORTISOL: 9.2 ug/dL

## 2016-06-09 ENCOUNTER — Encounter: Payer: Self-pay | Admitting: Hematology and Oncology

## 2016-06-09 ENCOUNTER — Encounter: Payer: Self-pay | Admitting: Cardiology

## 2016-06-10 LAB — TISSUE TRANSGLUTAMINASE, IGG: t-Transglutaminase (tTG) IgG: <2 U/mL (ref 0–5)

## 2016-06-16 ENCOUNTER — Ambulatory Visit (INDEPENDENT_AMBULATORY_CARE_PROVIDER_SITE_OTHER): Payer: BLUE CROSS/BLUE SHIELD | Admitting: Family Medicine

## 2016-06-16 ENCOUNTER — Encounter (HOSPITAL_COMMUNITY): Payer: BLUE CROSS/BLUE SHIELD

## 2016-06-16 DIAGNOSIS — E538 Deficiency of other specified B group vitamins: Secondary | ICD-10-CM | POA: Diagnosis not present

## 2016-06-16 MED ORDER — CYANOCOBALAMIN 1000 MCG/ML IJ SOLN
1000.0000 ug | Freq: Once | INTRAMUSCULAR | Status: AC
  Administered 2016-06-16: 1000 ug via INTRAMUSCULAR

## 2016-06-29 NOTE — Assessment & Plan Note (Deleted)
Iron deficiency anemia diagnosed in May 2017 when she presented with a ferritin of 9. She was put on oral replacement therapy. Whenever she takes oral iron replacement it appears that she gets diarrhea. She also does not think she can tolerate oral iron therapy. She is a vegan and does not eat any meat.  Cause of IDA:  decreased dietary iron  Patient has not noticed any blood loss from the uterus or bowels.  Recommendation: 1. Hb 13.2, MCV 95.6, retic count 50 2. iron sat 36% with ferritin 49 3. Celiac antibody panel Neg  B-12 deficiency: Patient uses B 12 injections with her primary care physician. Severe fatigue: Cortisol 9.2  RTC as needed

## 2016-06-30 ENCOUNTER — Ambulatory Visit: Payer: BLUE CROSS/BLUE SHIELD | Admitting: Hematology and Oncology

## 2016-06-30 ENCOUNTER — Ambulatory Visit (INDEPENDENT_AMBULATORY_CARE_PROVIDER_SITE_OTHER): Payer: BLUE CROSS/BLUE SHIELD | Admitting: Family Medicine

## 2016-06-30 DIAGNOSIS — E538 Deficiency of other specified B group vitamins: Secondary | ICD-10-CM | POA: Diagnosis not present

## 2016-06-30 MED ORDER — CYANOCOBALAMIN 1000 MCG/ML IJ SOLN
1000.0000 ug | Freq: Once | INTRAMUSCULAR | Status: AC
  Administered 2016-06-30: 1000 ug via INTRAMUSCULAR

## 2016-07-04 ENCOUNTER — Encounter: Payer: Self-pay | Admitting: Hematology and Oncology

## 2016-07-13 ENCOUNTER — Ambulatory Visit (INDEPENDENT_AMBULATORY_CARE_PROVIDER_SITE_OTHER): Payer: BLUE CROSS/BLUE SHIELD | Admitting: Family Medicine

## 2016-07-13 ENCOUNTER — Telehealth: Payer: Self-pay | Admitting: Family Medicine

## 2016-07-13 DIAGNOSIS — E538 Deficiency of other specified B group vitamins: Secondary | ICD-10-CM

## 2016-07-13 MED ORDER — CYANOCOBALAMIN 1000 MCG/ML IJ SOLN
1000.0000 ug | Freq: Once | INTRAMUSCULAR | Status: AC
  Administered 2016-07-13: 1000 ug via INTRAMUSCULAR

## 2016-07-13 NOTE — Telephone Encounter (Signed)
Pt has an appt in 2 wks and would like new rxs hydrocodone for jan, feb and mar 2018 and will pick up then

## 2016-07-13 NOTE — Progress Notes (Signed)
Patient was given B12 injection for Vit B 12 deficiency

## 2016-07-15 MED ORDER — HYDROCODONE-ACETAMINOPHEN 5-325 MG PO TABS: ORAL_TABLET | ORAL | 0 refills | Status: DC

## 2016-07-15 NOTE — Telephone Encounter (Signed)
Done for one month only  

## 2016-08-01 ENCOUNTER — Ambulatory Visit: Payer: BLUE CROSS/BLUE SHIELD | Admitting: Cardiology

## 2016-08-01 ENCOUNTER — Ambulatory Visit (INDEPENDENT_AMBULATORY_CARE_PROVIDER_SITE_OTHER): Payer: BLUE CROSS/BLUE SHIELD | Admitting: Family Medicine

## 2016-08-01 DIAGNOSIS — E538 Deficiency of other specified B group vitamins: Secondary | ICD-10-CM

## 2016-08-01 MED ORDER — CYANOCOBALAMIN 1000 MCG/ML IJ SOLN
1000.0000 ug | Freq: Once | INTRAMUSCULAR | Status: AC
  Administered 2016-08-01: 1000 ug via INTRAMUSCULAR

## 2016-08-12 ENCOUNTER — Ambulatory Visit (INDEPENDENT_AMBULATORY_CARE_PROVIDER_SITE_OTHER): Payer: BLUE CROSS/BLUE SHIELD

## 2016-08-12 DIAGNOSIS — E538 Deficiency of other specified B group vitamins: Secondary | ICD-10-CM

## 2016-08-12 MED ORDER — CYANOCOBALAMIN 1000 MCG/ML IJ SOLN
1000.0000 ug | Freq: Once | INTRAMUSCULAR | Status: AC
  Administered 2016-08-12: 1000 ug via INTRAMUSCULAR

## 2016-08-12 NOTE — Progress Notes (Signed)
Patient got her B12 injection for her Vit B12 deficiency on 08/12/16 at 12.10 pm. Given by Mendel CorningNancy Rosezella Kronick CMA

## 2016-08-22 ENCOUNTER — Encounter: Payer: Self-pay | Admitting: Family Medicine

## 2016-08-22 MED ORDER — BIMATOPROST 0.03 % EX SOLN: 1.0000 "application " | Freq: Every day | CUTANEOUS | 11 refills | Status: DC

## 2016-08-22 NOTE — Telephone Encounter (Signed)
I printed a rx she can pick up

## 2016-08-25 ENCOUNTER — Ambulatory Visit (INDEPENDENT_AMBULATORY_CARE_PROVIDER_SITE_OTHER): Payer: BLUE CROSS/BLUE SHIELD | Admitting: Family Medicine

## 2016-08-25 DIAGNOSIS — E538 Deficiency of other specified B group vitamins: Secondary | ICD-10-CM

## 2016-08-25 MED ORDER — CYANOCOBALAMIN 1000 MCG/ML IJ SOLN
1000.0000 ug | Freq: Once | INTRAMUSCULAR | Status: AC
  Administered 2016-08-25: 1000 ug via INTRAMUSCULAR

## 2016-09-08 ENCOUNTER — Ambulatory Visit (INDEPENDENT_AMBULATORY_CARE_PROVIDER_SITE_OTHER): Payer: BLUE CROSS/BLUE SHIELD

## 2016-09-08 DIAGNOSIS — E538 Deficiency of other specified B group vitamins: Secondary | ICD-10-CM | POA: Diagnosis not present

## 2016-09-08 MED ORDER — CYANOCOBALAMIN 1000 MCG/ML IJ SOLN
1000.0000 ug | Freq: Once | INTRAMUSCULAR | Status: AC
  Administered 2016-09-08: 1000 ug via INTRAMUSCULAR

## 2016-09-23 ENCOUNTER — Ambulatory Visit (INDEPENDENT_AMBULATORY_CARE_PROVIDER_SITE_OTHER): Payer: BLUE CROSS/BLUE SHIELD

## 2016-09-23 DIAGNOSIS — E538 Deficiency of other specified B group vitamins: Secondary | ICD-10-CM | POA: Diagnosis not present

## 2016-09-23 MED ORDER — CYANOCOBALAMIN 1000 MCG/ML IJ SOLN
1000.0000 ug | Freq: Once | INTRAMUSCULAR | Status: AC
  Administered 2016-09-23: 1000 ug via INTRAMUSCULAR

## 2016-09-23 NOTE — Progress Notes (Signed)
Patient got her B12 injection on 09/23/2016 at 11.50am for her Vit B12 deficiency. Given by Mendel CorningNancy Leota Maka CMA.

## 2016-10-06 ENCOUNTER — Other Ambulatory Visit: Payer: Self-pay | Admitting: Family Medicine

## 2016-10-06 ENCOUNTER — Encounter: Payer: Self-pay | Admitting: Family Medicine

## 2016-10-07 ENCOUNTER — Ambulatory Visit (INDEPENDENT_AMBULATORY_CARE_PROVIDER_SITE_OTHER): Payer: BLUE CROSS/BLUE SHIELD

## 2016-10-07 DIAGNOSIS — E538 Deficiency of other specified B group vitamins: Secondary | ICD-10-CM | POA: Diagnosis not present

## 2016-10-07 MED ORDER — CYANOCOBALAMIN 1000 MCG/ML IJ SOLN
1000.0000 ug | Freq: Once | INTRAMUSCULAR | Status: AC
  Administered 2016-10-07: 1000 ug via INTRAMUSCULAR

## 2016-10-07 NOTE — Progress Notes (Signed)
Patient here for b12 injection; injection given IM to left ventragluteal. Patient tolerated well; no s/s of reaction; next injection visit scheduled for 2 weeks.

## 2016-10-07 NOTE — Telephone Encounter (Signed)
Call in #30 with 5 rf 

## 2016-10-10 ENCOUNTER — Encounter: Payer: Self-pay | Admitting: Family Medicine

## 2016-10-10 NOTE — Telephone Encounter (Signed)
Rx phoned in.   

## 2016-10-11 NOTE — Telephone Encounter (Signed)
Pt following up on refill request.pt wants to know if ok?

## 2016-10-12 NOTE — Telephone Encounter (Signed)
Make her an OV please

## 2016-10-12 NOTE — Telephone Encounter (Signed)
Have her make an OV to discuss these things

## 2016-10-13 ENCOUNTER — Ambulatory Visit (INDEPENDENT_AMBULATORY_CARE_PROVIDER_SITE_OTHER): Payer: BLUE CROSS/BLUE SHIELD | Admitting: Family Medicine

## 2016-10-13 ENCOUNTER — Encounter: Payer: Self-pay | Admitting: Family Medicine

## 2016-10-13 VITALS — BP 98/52 | Temp 98.4°F | Ht 60.0 in | Wt 85.0 lb

## 2016-10-13 DIAGNOSIS — K219 Gastro-esophageal reflux disease without esophagitis: Secondary | ICD-10-CM | POA: Diagnosis not present

## 2016-10-13 MED ORDER — AMOXICILL-CLARITHRO-LANSOPRAZ PO MISC: Freq: Two times a day (BID) | ORAL | 2 refills | Status: DC

## 2016-10-13 NOTE — Progress Notes (Signed)
   Subjective:    Patient ID: Emma Palmer, female    DOB: June 28, 1966, 51 y.o.   MRN: [id number]  HPI Here for another bout of epigastric burning pains for the past week. She has been treated for H pylori infections in the past, and taking a Prev Pak always helps. She would like to try this again. No nausea or fever.    Review of Systems  Constitutional: Negative.   Respiratory: Negative.   Cardiovascular: Negative.   Gastrointestinal: Positive for abdominal pain. Negative for abdominal distention, anal bleeding, blood in stool, constipation, diarrhea, nausea, rectal pain and vomiting.       Objective:   Physical Exam  Constitutional: She appears well-developed and well-nourished.  Cardiovascular: Normal rate, regular rhythm, normal heart sounds and intact distal pulses.   Pulmonary/Chest: Effort normal and breath sounds normal.  Abdominal: Soft. Bowel sounds are normal. She exhibits no distension and no mass. There is no rebound and no guarding.  Tender in the epigastrium           Assessment & Plan:  Gastritis, possible recurrence of H pylori. Treat with 14 days of Lansoprazole, amoxicillin, and clarithromycin. After that is finished, she will start taking Zantac 150 mg daily.  Emma CraneStephen Kimyetta Flott, MD

## 2016-10-13 NOTE — Patient Instructions (Signed)
WE NOW OFFER   [facility] FAST TRACK!!!  SAME DAY Appointments for ACUTE CARE  Such as: Sprains, Injuries, cuts, abrasions, rashes, muscle pain, joint pain, back pain Colds, flu, sore throats, headache, allergies, cough, fever  Ear pain, sinus and eye infections Abdominal pain, nausea, vomiting, diarrhea, upset stomach Animal/insect bites  3 Easy Ways to Schedule: Walk-In Scheduling Call in scheduling Mychart Sign-up: [email or web address]         

## 2016-10-13 NOTE — Progress Notes (Signed)
Pre visit review using our clinic review tool, if applicable. No additional management support is needed unless otherwise documented below in the visit note. 

## 2016-10-20 ENCOUNTER — Ambulatory Visit (INDEPENDENT_AMBULATORY_CARE_PROVIDER_SITE_OTHER): Payer: BLUE CROSS/BLUE SHIELD | Admitting: *Deleted

## 2016-10-20 DIAGNOSIS — E538 Deficiency of other specified B group vitamins: Secondary | ICD-10-CM | POA: Diagnosis not present

## 2016-10-20 MED ORDER — CYANOCOBALAMIN 1000 MCG/ML IJ SOLN
1000.0000 ug | Freq: Once | INTRAMUSCULAR | Status: AC
  Administered 2016-10-20: 1000 ug via INTRAMUSCULAR

## 2016-11-03 ENCOUNTER — Ambulatory Visit (INDEPENDENT_AMBULATORY_CARE_PROVIDER_SITE_OTHER): Payer: BLUE CROSS/BLUE SHIELD

## 2016-11-03 DIAGNOSIS — E538 Deficiency of other specified B group vitamins: Secondary | ICD-10-CM

## 2016-11-03 MED ORDER — CYANOCOBALAMIN 1000 MCG/ML IJ SOLN
1000.0000 ug | Freq: Once | INTRAMUSCULAR | Status: AC
  Administered 2016-11-03: 1000 ug via INTRAMUSCULAR

## 2016-11-17 ENCOUNTER — Telehealth: Payer: Self-pay | Admitting: Family Medicine

## 2016-11-17 ENCOUNTER — Ambulatory Visit (INDEPENDENT_AMBULATORY_CARE_PROVIDER_SITE_OTHER): Payer: BLUE CROSS/BLUE SHIELD

## 2016-11-17 DIAGNOSIS — E538 Deficiency of other specified B group vitamins: Secondary | ICD-10-CM | POA: Diagnosis not present

## 2016-11-17 MED ORDER — CYANOCOBALAMIN 1000 MCG/ML IJ SOLN
1000.0000 ug | Freq: Once | INTRAMUSCULAR | Status: AC
  Administered 2016-11-17: 1000 ug via INTRAMUSCULAR

## 2016-11-17 NOTE — Telephone Encounter (Signed)
° ° °  Pt request refill of the following:  HYDROcodone-acetaminophen (NORCO/VICODIN) 5-325 MG tablet   Pt has an appointment on 12/01/16 and will pick up RX then    Phamacy:

## 2016-11-17 NOTE — Telephone Encounter (Signed)
Please Advise

## 2016-11-29 ENCOUNTER — Other Ambulatory Visit: Payer: Self-pay | Admitting: Family Medicine

## 2016-11-29 ENCOUNTER — Telehealth: Payer: Self-pay | Admitting: Family Medicine

## 2016-11-29 ENCOUNTER — Encounter: Payer: Self-pay | Admitting: Family Medicine

## 2016-11-29 ENCOUNTER — Ambulatory Visit (INDEPENDENT_AMBULATORY_CARE_PROVIDER_SITE_OTHER): Payer: BLUE CROSS/BLUE SHIELD | Admitting: Family Medicine

## 2016-11-29 DIAGNOSIS — E538 Deficiency of other specified B group vitamins: Secondary | ICD-10-CM | POA: Diagnosis not present

## 2016-11-29 MED ORDER — CYANOCOBALAMIN 1000 MCG/ML IJ SOLN
1000.0000 ug | Freq: Once | INTRAMUSCULAR | Status: AC
  Administered 2016-11-29: 1000 ug via INTRAMUSCULAR

## 2016-11-29 MED ORDER — HYDROCODONE-ACETAMINOPHEN 5-325 MG PO TABS: ORAL_TABLET | ORAL | 0 refills | Status: DC

## 2016-11-29 NOTE — Telephone Encounter (Signed)
Refill for Norco.

## 2016-11-29 NOTE — Telephone Encounter (Signed)
Script is ready and pt is here for pick up now.

## 2016-11-29 NOTE — Progress Notes (Signed)
We will get a B12 level in 2 weeks and then decide on a dosing regimen

## 2016-11-29 NOTE — Telephone Encounter (Signed)
done

## 2016-11-29 NOTE — Telephone Encounter (Signed)
Pt was here for Vitamin B 12 injection, per Dr. Clent RidgesFry give today and order a level in about 2 weeks. I put in a future lab order and gave injection.

## 2016-12-01 ENCOUNTER — Ambulatory Visit: Payer: BLUE CROSS/BLUE SHIELD | Admitting: Family Medicine

## 2016-12-15 ENCOUNTER — Ambulatory Visit: Payer: BLUE CROSS/BLUE SHIELD

## 2016-12-15 ENCOUNTER — Other Ambulatory Visit (INDEPENDENT_AMBULATORY_CARE_PROVIDER_SITE_OTHER): Payer: BLUE CROSS/BLUE SHIELD

## 2016-12-15 ENCOUNTER — Other Ambulatory Visit: Payer: BLUE CROSS/BLUE SHIELD

## 2016-12-15 DIAGNOSIS — E538 Deficiency of other specified B group vitamins: Secondary | ICD-10-CM | POA: Diagnosis not present

## 2016-12-15 LAB — VITAMIN B12: Vitamin B-12: 683 pg/mL (ref 211–911)

## 2016-12-15 MED ORDER — CYANOCOBALAMIN 1000 MCG/ML IJ SOLN
1000.0000 ug | Freq: Once | INTRAMUSCULAR | Status: AC
  Administered 2016-12-15: 1000 ug via INTRAMUSCULAR

## 2016-12-29 ENCOUNTER — Ambulatory Visit (INDEPENDENT_AMBULATORY_CARE_PROVIDER_SITE_OTHER): Payer: BLUE CROSS/BLUE SHIELD | Admitting: Family Medicine

## 2016-12-29 DIAGNOSIS — E538 Deficiency of other specified B group vitamins: Secondary | ICD-10-CM | POA: Diagnosis not present

## 2016-12-29 MED ORDER — CYANOCOBALAMIN 1000 MCG/ML IJ SOLN
1000.0000 ug | Freq: Once | INTRAMUSCULAR | Status: AC
  Administered 2016-12-29: 1000 ug via INTRAMUSCULAR

## 2017-01-06 ENCOUNTER — Encounter: Payer: Self-pay | Admitting: Family Medicine

## 2017-01-06 ENCOUNTER — Ambulatory Visit (INDEPENDENT_AMBULATORY_CARE_PROVIDER_SITE_OTHER): Payer: BLUE CROSS/BLUE SHIELD | Admitting: Family Medicine

## 2017-01-06 VITALS — BP 100/68 | Temp 98.6°F | Ht 60.0 in | Wt 86.0 lb

## 2017-01-06 DIAGNOSIS — M2550 Pain in unspecified joint: Secondary | ICD-10-CM | POA: Diagnosis not present

## 2017-01-06 DIAGNOSIS — E538 Deficiency of other specified B group vitamins: Secondary | ICD-10-CM

## 2017-01-06 MED ORDER — CYANOCOBALAMIN 1000 MCG/ML IJ SOLN: 1000.0000 ug | Freq: Once | INTRAMUSCULAR | 0 refills | Status: AC

## 2017-01-06 MED ORDER — CYANOCOBALAMIN 1000 MCG/ML IJ SOLN
1000.0000 ug | Freq: Once | INTRAMUSCULAR | Status: AC
  Administered 2017-01-06: 1000 ug via INTRAMUSCULAR

## 2017-01-06 MED ORDER — CYANOCOBALAMIN 1000 MCG/ML IJ SOLN: 1000.0000 ug | INTRAMUSCULAR | 0 refills | Status: DC

## 2017-01-06 NOTE — Patient Instructions (Signed)
WE NOW OFFER   [facility] FAST TRACK!!!  SAME DAY Appointments for ACUTE CARE  Such as: Sprains, Injuries, cuts, abrasions, rashes, muscle pain, joint pain, back pain Colds, flu, sore throats, headache, allergies, cough, fever  Ear pain, sinus and eye infections Abdominal pain, nausea, vomiting, diarrhea, upset stomach Animal/insect bites  3 Easy Ways to Schedule: Walk-In Scheduling Call in scheduling Mychart Sign-up: [email or web address]         

## 2017-01-06 NOTE — Progress Notes (Signed)
   Subjective:    Patient ID: Emma KernsKaren P Palmer, female    DOB: [birth year], 51 y.o.   MRN: [id number]  HPI Here for right knee pain and also to discuss other pains in the hips, knees, and feet. She admits to wearing flip flops or other non-supportive shoes. The knee hurts to lay on it but not to walk on it. Also she wants to discuss her B12 therapy. She is currently getting shots every 2 weeks. The recent level on 12-15-16 was acceptable at 683 but was lower than what we were expecting. She does have chronic tingling and burning in both feet.    Review of Systems  Constitutional: Negative.   Respiratory: Negative.   Cardiovascular: Negative.   Musculoskeletal: Positive for arthralgias. Negative for joint swelling.  Neurological: Positive for numbness.       Objective:   Physical Exam  Constitutional: She is oriented to person, place, and time. She appears well-developed and well-nourished.  Cardiovascular: Normal rate, regular rhythm, normal heart sounds and intact distal pulses.   Pulmonary/Chest: Effort normal and breath sounds normal. No respiratory distress. She has no wheezes. She has no rales.  Musculoskeletal:  She is tender in the right lateral knee just above  the proximal fibula. No swelling, full ROM.   Neurological: She is alert and oriented to person, place, and time. No cranial nerve deficit. She exhibits normal muscle tone. Coordination normal.          Assessment & Plan:  Various pains in the hips, knees, and feet. First she will invest in some good shoes with better arch supports. If this does not help I would suggest she see Sports Medicine. Her B12 deficiency has contributed to the neuropathy in her feet so we agreed to increase her frequency of shots to weekly. Recheck a level in 6 months.  Gershon CraneStephen Fry, MD

## 2017-01-13 ENCOUNTER — Ambulatory Visit: Payer: BLUE CROSS/BLUE SHIELD

## 2017-01-13 ENCOUNTER — Ambulatory Visit (INDEPENDENT_AMBULATORY_CARE_PROVIDER_SITE_OTHER): Payer: BLUE CROSS/BLUE SHIELD

## 2017-01-13 DIAGNOSIS — E538 Deficiency of other specified B group vitamins: Secondary | ICD-10-CM | POA: Diagnosis not present

## 2017-01-13 MED ORDER — CYANOCOBALAMIN 1000 MCG/ML IJ SOLN
1000.0000 ug | Freq: Once | INTRAMUSCULAR | Status: AC
  Administered 2017-01-13: 1000 ug via INTRAMUSCULAR

## 2017-01-19 ENCOUNTER — Ambulatory Visit (INDEPENDENT_AMBULATORY_CARE_PROVIDER_SITE_OTHER): Payer: BLUE CROSS/BLUE SHIELD | Admitting: *Deleted

## 2017-01-19 DIAGNOSIS — E538 Deficiency of other specified B group vitamins: Secondary | ICD-10-CM

## 2017-01-19 MED ORDER — CYANOCOBALAMIN 1000 MCG/ML IJ SOLN
1000.0000 ug | Freq: Once | INTRAMUSCULAR | Status: AC
  Administered 2017-01-19: 1000 ug via INTRAMUSCULAR

## 2017-01-26 ENCOUNTER — Ambulatory Visit (INDEPENDENT_AMBULATORY_CARE_PROVIDER_SITE_OTHER): Payer: BLUE CROSS/BLUE SHIELD

## 2017-01-26 DIAGNOSIS — E538 Deficiency of other specified B group vitamins: Secondary | ICD-10-CM | POA: Diagnosis not present

## 2017-01-26 MED ORDER — CYANOCOBALAMIN 1000 MCG/ML IJ SOLN
1000.0000 ug | Freq: Once | INTRAMUSCULAR | Status: AC
  Administered 2017-01-26: 1000 ug via INTRAMUSCULAR

## 2017-02-02 ENCOUNTER — Ambulatory Visit (INDEPENDENT_AMBULATORY_CARE_PROVIDER_SITE_OTHER): Payer: BLUE CROSS/BLUE SHIELD | Admitting: *Deleted

## 2017-02-02 DIAGNOSIS — E538 Deficiency of other specified B group vitamins: Secondary | ICD-10-CM

## 2017-02-02 MED ORDER — CYANOCOBALAMIN 1000 MCG/ML IJ SOLN
1000.0000 ug | Freq: Once | INTRAMUSCULAR | Status: AC
  Administered 2017-02-02: 1000 ug via INTRAMUSCULAR

## 2017-02-02 NOTE — Progress Notes (Signed)
Patient seen for B12 injection; administered IM to rt ventrogluteal; patient tolerated well; no s/s of reactions.

## 2017-02-09 ENCOUNTER — Ambulatory Visit (INDEPENDENT_AMBULATORY_CARE_PROVIDER_SITE_OTHER): Payer: BLUE CROSS/BLUE SHIELD

## 2017-02-09 DIAGNOSIS — E538 Deficiency of other specified B group vitamins: Secondary | ICD-10-CM | POA: Diagnosis not present

## 2017-02-09 MED ORDER — CYANOCOBALAMIN 1000 MCG/ML IJ SOLN
1000.0000 ug | Freq: Once | INTRAMUSCULAR | Status: AC
  Administered 2017-02-09: 1000 ug via INTRAMUSCULAR

## 2017-02-14 NOTE — Progress Notes (Signed)
As noted

## 2017-02-14 NOTE — Progress Notes (Signed)
As written

## 2017-02-20 ENCOUNTER — Ambulatory Visit (INDEPENDENT_AMBULATORY_CARE_PROVIDER_SITE_OTHER): Payer: BLUE CROSS/BLUE SHIELD | Admitting: *Deleted

## 2017-02-20 DIAGNOSIS — E538 Deficiency of other specified B group vitamins: Secondary | ICD-10-CM | POA: Diagnosis not present

## 2017-02-20 MED ORDER — CYANOCOBALAMIN 1000 MCG/ML IJ SOLN
1000.0000 ug | Freq: Once | INTRAMUSCULAR | Status: AC
  Administered 2017-02-20: 1000 ug via INTRAMUSCULAR

## 2017-02-28 ENCOUNTER — Ambulatory Visit: Payer: BLUE CROSS/BLUE SHIELD

## 2017-03-01 ENCOUNTER — Ambulatory Visit (INDEPENDENT_AMBULATORY_CARE_PROVIDER_SITE_OTHER): Payer: BLUE CROSS/BLUE SHIELD

## 2017-03-01 DIAGNOSIS — E538 Deficiency of other specified B group vitamins: Secondary | ICD-10-CM

## 2017-03-01 MED ORDER — CYANOCOBALAMIN 1000 MCG/ML IJ SOLN
1000.0000 ug | Freq: Once | INTRAMUSCULAR | Status: AC
  Administered 2017-03-01: 1000 ug via INTRAMUSCULAR

## 2017-03-07 ENCOUNTER — Ambulatory Visit (INDEPENDENT_AMBULATORY_CARE_PROVIDER_SITE_OTHER): Payer: BLUE CROSS/BLUE SHIELD

## 2017-03-07 DIAGNOSIS — E538 Deficiency of other specified B group vitamins: Secondary | ICD-10-CM | POA: Diagnosis not present

## 2017-03-07 MED ORDER — CYANOCOBALAMIN 1000 MCG/ML IJ SOLN
1000.0000 ug | Freq: Once | INTRAMUSCULAR | Status: AC
  Administered 2017-03-07: 1000 ug via INTRAMUSCULAR

## 2017-03-14 ENCOUNTER — Ambulatory Visit (INDEPENDENT_AMBULATORY_CARE_PROVIDER_SITE_OTHER): Payer: BLUE CROSS/BLUE SHIELD

## 2017-03-14 DIAGNOSIS — E538 Deficiency of other specified B group vitamins: Secondary | ICD-10-CM | POA: Diagnosis not present

## 2017-03-14 MED ORDER — CYANOCOBALAMIN 1000 MCG/ML IJ SOLN
1000.0000 ug | Freq: Once | INTRAMUSCULAR | Status: AC
Start: 2017-03-14 — End: 2017-03-14
  Administered 2017-03-14: 1000 ug via INTRAMUSCULAR

## 2017-03-14 NOTE — Progress Notes (Signed)
Pt came in for B12 injection. Pt tolerated her injection well.

## 2017-03-22 ENCOUNTER — Ambulatory Visit (INDEPENDENT_AMBULATORY_CARE_PROVIDER_SITE_OTHER): Payer: BLUE CROSS/BLUE SHIELD | Admitting: *Deleted

## 2017-03-22 DIAGNOSIS — E538 Deficiency of other specified B group vitamins: Secondary | ICD-10-CM

## 2017-03-22 MED ORDER — CYANOCOBALAMIN 1000 MCG/ML IJ SOLN
1000.0000 ug | Freq: Once | INTRAMUSCULAR | Status: AC
  Administered 2017-03-22: 1000 ug via INTRAMUSCULAR

## 2017-03-22 NOTE — Progress Notes (Signed)
Pt here for weekly B12 injection. Patient tolerated injection well.  Starla Linkarolyn J Asheley Hellberg, RN

## 2017-03-28 ENCOUNTER — Encounter: Payer: Self-pay | Admitting: Family Medicine

## 2017-03-28 ENCOUNTER — Ambulatory Visit (INDEPENDENT_AMBULATORY_CARE_PROVIDER_SITE_OTHER): Payer: BLUE CROSS/BLUE SHIELD | Admitting: Family Medicine

## 2017-03-28 VITALS — BP 98/60 | Temp 98.8°F | Ht 60.0 in | Wt 85.0 lb

## 2017-03-28 DIAGNOSIS — G43101 Migraine with aura, not intractable, with status migrainosus: Secondary | ICD-10-CM

## 2017-03-28 DIAGNOSIS — E538 Deficiency of other specified B group vitamins: Secondary | ICD-10-CM

## 2017-03-28 MED ORDER — METHYLPREDNISOLONE ACETATE 80 MG/ML IJ SUSP
120.0000 mg | Freq: Once | INTRAMUSCULAR | Status: AC
Start: 2017-03-28 — End: 2017-03-28
  Administered 2017-03-28: 120 mg via INTRAMUSCULAR

## 2017-03-28 MED ORDER — KETOROLAC TROMETHAMINE 60 MG/2ML IM SOLN
60.0000 mg | Freq: Once | INTRAMUSCULAR | Status: AC
  Administered 2017-03-28: 60 mg via INTRAMUSCULAR

## 2017-03-28 MED ORDER — CYANOCOBALAMIN 1000 MCG/ML IJ SOLN
1000.0000 ug | Freq: Once | INTRAMUSCULAR | Status: AC
  Administered 2017-03-28: 1000 ug via INTRAMUSCULAR

## 2017-03-28 NOTE — Patient Instructions (Signed)
WE NOW OFFER   [facility] FAST TRACK!!!  SAME DAY Appointments for ACUTE CARE  Such as: Sprains, Injuries, cuts, abrasions, rashes, muscle pain, joint pain, back pain Colds, flu, sore throats, headache, allergies, cough, fever  Ear pain, sinus and eye infections Abdominal pain, nausea, vomiting, diarrhea, upset stomach Animal/insect bites  3 Easy Ways to Schedule: Walk-In Scheduling Call in scheduling Mychart Sign-up: [email or web address]         

## 2017-03-28 NOTE — Progress Notes (Signed)
   Subjective:    Patient ID: Emma KernsKaren P Palmer, female    DOB: [birth year], 51 y.o.   MRN: [id number]  HPI Here for a migraine that has lasted 3 days. This started with an hour of seeing colored rings of light in her vision, and then a headache started on the top of the head which has generalized to the entire head. She has vomited once. Drinking fluids. She drove herself here.    Review of Systems  Constitutional: Negative.   Eyes: Positive for visual disturbance.  Respiratory: Negative.   Cardiovascular: Negative.   Neurological: Positive for headaches.       Objective:   Physical Exam  Constitutional: She is oriented to person, place, and time.  In pain, shows some photophobia   HENT:  Head: Normocephalic and atraumatic.  Right Ear: External ear normal.  Left Ear: External ear normal.  Nose: Nose normal.  Mouth/Throat: Oropharynx is clear and moist.  Eyes: Pupils are equal, round, and reactive to light. Conjunctivae and EOM are normal.  Neck: Neck supple. No thyromegaly present.  Cardiovascular: Normal rate, regular rhythm, normal heart sounds and intact distal pulses.   Pulmonary/Chest: Effort normal and breath sounds normal. No respiratory distress. She has no wheezes. She has no rales.  Lymphadenopathy:    She has no cervical adenopathy.  Neurological: She is alert and oriented to person, place, and time.          Assessment & Plan:  Migraine headache with aura. She is given shots of Toradol and DepoMedrol. Recheck prn.  Gershon CraneStephen Fry, MD

## 2017-03-28 NOTE — Addendum Note (Signed)
Addended by: Aniceto BossNIMMONS, SYLVIA A on: 03/28/2017 01:43 PM   Modules accepted: Orders

## 2017-03-29 ENCOUNTER — Ambulatory Visit: Payer: BLUE CROSS/BLUE SHIELD

## 2017-04-04 ENCOUNTER — Ambulatory Visit (INDEPENDENT_AMBULATORY_CARE_PROVIDER_SITE_OTHER): Payer: BLUE CROSS/BLUE SHIELD | Admitting: *Deleted

## 2017-04-04 DIAGNOSIS — E538 Deficiency of other specified B group vitamins: Secondary | ICD-10-CM

## 2017-04-04 MED ORDER — CYANOCOBALAMIN 1000 MCG/ML IJ SOLN
1000.0000 ug | Freq: Once | INTRAMUSCULAR | Status: AC
  Administered 2017-04-04: 1000 ug via INTRAMUSCULAR

## 2017-04-06 ENCOUNTER — Encounter: Payer: Self-pay | Admitting: Family Medicine

## 2017-04-07 ENCOUNTER — Other Ambulatory Visit: Payer: Self-pay | Admitting: Family Medicine

## 2017-04-07 NOTE — Telephone Encounter (Signed)
Call in #30 with 5 rf 

## 2017-04-11 ENCOUNTER — Ambulatory Visit (INDEPENDENT_AMBULATORY_CARE_PROVIDER_SITE_OTHER): Payer: BLUE CROSS/BLUE SHIELD | Admitting: Family Medicine

## 2017-04-11 ENCOUNTER — Encounter: Payer: Self-pay | Admitting: Family Medicine

## 2017-04-11 VITALS — BP 96/70 | Temp 98.2°F | Ht 60.0 in | Wt 86.0 lb

## 2017-04-11 DIAGNOSIS — K219 Gastro-esophageal reflux disease without esophagitis: Secondary | ICD-10-CM

## 2017-04-11 DIAGNOSIS — J019 Acute sinusitis, unspecified: Secondary | ICD-10-CM | POA: Diagnosis not present

## 2017-04-11 DIAGNOSIS — J029 Acute pharyngitis, unspecified: Secondary | ICD-10-CM | POA: Diagnosis not present

## 2017-04-11 DIAGNOSIS — E538 Deficiency of other specified B group vitamins: Secondary | ICD-10-CM

## 2017-04-11 LAB — POCT RAPID STREP A (OFFICE): RAPID STREP A SCREEN: NEGATIVE

## 2017-04-11 MED ORDER — KETOROLAC TROMETHAMINE 60 MG/2ML IM SOLN
60.0000 mg | Freq: Once | INTRAMUSCULAR | Status: AC
  Administered 2017-04-11: 60 mg via INTRAMUSCULAR

## 2017-04-11 MED ORDER — CYANOCOBALAMIN 1000 MCG/ML IJ SOLN
1000.0000 ug | Freq: Once | INTRAMUSCULAR | Status: AC
  Administered 2017-04-11: 1000 ug via INTRAMUSCULAR

## 2017-04-11 NOTE — Addendum Note (Signed)
Addended by: Aniceto Boss A on: 04/11/2017 12:16 PM   Modules accepted: Orders

## 2017-04-11 NOTE — Patient Instructions (Signed)
WE NOW OFFER   [facility] FAST TRACK!!!  SAME DAY Appointments for ACUTE CARE  Such as: Sprains, Injuries, cuts, abrasions, rashes, muscle pain, joint pain, back pain Colds, flu, sore throats, headache, allergies, cough, fever  Ear pain, sinus and eye infections Abdominal pain, nausea, vomiting, diarrhea, upset stomach Animal/insect bites  3 Easy Ways to Schedule: Walk-In Scheduling Call in scheduling Mychart Sign-up: [email or web address]         

## 2017-04-11 NOTE — Progress Notes (Signed)
   Subjective:    Patient ID: Emma Palmer, female    DOB: [birth year], 51 y.o.   MRN: [id number]  HPI Here for 3 days of headache, PND, ST, and a dry cough. No fever.    Review of Systems  Constitutional: Negative.   HENT: Positive for postnasal drip, sinus pain, sinus pressure and sore throat. Negative for congestion and ear pain.   Eyes: Negative.   Respiratory: Positive for cough.        Objective:   Physical Exam  Constitutional: She appears well-developed and well-nourished.  HENT:  Right Ear: External ear normal.  Left Ear: External ear normal.  Nose: Nose normal.  Posterior OP is red without exudate   Eyes: Conjunctivae are normal.  Neck: Neck supple. No thyromegaly present.  Pulmonary/Chest: Effort normal and breath sounds normal. No respiratory distress. She has no wheezes. She has no rales.  Lymphadenopathy:    She has no cervical adenopathy.          Assessment & Plan:  Sinusitis, treat a Zpack. Given a Toradol shot for the headache. Refer to GI for the epigastric pain.  Gershon Crane, MD

## 2017-04-12 ENCOUNTER — Other Ambulatory Visit: Payer: Self-pay | Admitting: Family Medicine

## 2017-04-12 MED ORDER — AZITHROMYCIN 250 MG PO TABS: ORAL_TABLET | ORAL | 0 refills | Status: DC

## 2017-04-13 ENCOUNTER — Encounter: Payer: Self-pay | Admitting: Physician Assistant

## 2017-04-14 ENCOUNTER — Encounter: Payer: Self-pay | Admitting: Internal Medicine

## 2017-04-18 ENCOUNTER — Ambulatory Visit (INDEPENDENT_AMBULATORY_CARE_PROVIDER_SITE_OTHER): Payer: BLUE CROSS/BLUE SHIELD | Admitting: *Deleted

## 2017-04-18 DIAGNOSIS — E538 Deficiency of other specified B group vitamins: Secondary | ICD-10-CM

## 2017-04-18 MED ORDER — CYANOCOBALAMIN 1000 MCG/ML IJ SOLN
1000.0000 ug | Freq: Once | INTRAMUSCULAR | Status: AC
  Administered 2017-04-18: 1000 ug via INTRAMUSCULAR

## 2017-04-18 MED ORDER — CYANOCOBALAMIN 1000 MCG/ML IJ SOLN: 1000.0000 ug | Freq: Once | INTRAMUSCULAR | 0 refills | Status: DC

## 2017-04-18 NOTE — Progress Notes (Signed)
Patient here for weekly B12 injection.  Last injection 04/11/17.  Patient tolerated injection well.  Starla Link, RN

## 2017-04-24 ENCOUNTER — Ambulatory Visit (INDEPENDENT_AMBULATORY_CARE_PROVIDER_SITE_OTHER): Payer: BLUE CROSS/BLUE SHIELD | Admitting: *Deleted

## 2017-04-24 DIAGNOSIS — E538 Deficiency of other specified B group vitamins: Secondary | ICD-10-CM | POA: Diagnosis not present

## 2017-04-24 MED ORDER — CYANOCOBALAMIN 1000 MCG/ML IJ SOLN
1000.0000 ug | Freq: Once | INTRAMUSCULAR | Status: AC
  Administered 2017-04-24: 1000 ug via INTRAMUSCULAR

## 2017-04-24 NOTE — Progress Notes (Signed)
Patient here for weekly B12 injection.  Last injection 04/18/17.  Patient tolerated injection well.  Starla Link, RN

## 2017-04-26 NOTE — Progress Notes (Signed)
Done

## 2017-05-02 ENCOUNTER — Ambulatory Visit (INDEPENDENT_AMBULATORY_CARE_PROVIDER_SITE_OTHER): Payer: BLUE CROSS/BLUE SHIELD | Admitting: *Deleted

## 2017-05-02 DIAGNOSIS — E538 Deficiency of other specified B group vitamins: Secondary | ICD-10-CM | POA: Diagnosis not present

## 2017-05-02 MED ORDER — CYANOCOBALAMIN 1000 MCG/ML IJ SOLN
1000.0000 ug | Freq: Once | INTRAMUSCULAR | Status: AC
  Administered 2017-05-02: 1000 ug via INTRAMUSCULAR

## 2017-05-09 ENCOUNTER — Ambulatory Visit (INDEPENDENT_AMBULATORY_CARE_PROVIDER_SITE_OTHER): Payer: BLUE CROSS/BLUE SHIELD

## 2017-05-09 DIAGNOSIS — E538 Deficiency of other specified B group vitamins: Secondary | ICD-10-CM

## 2017-05-09 MED ORDER — CYANOCOBALAMIN 1000 MCG/ML IJ SOLN
1000.0000 ug | Freq: Once | INTRAMUSCULAR | Status: AC
  Administered 2017-05-09: 1000 ug via INTRAMUSCULAR

## 2017-05-16 ENCOUNTER — Ambulatory Visit (INDEPENDENT_AMBULATORY_CARE_PROVIDER_SITE_OTHER): Payer: BLUE CROSS/BLUE SHIELD | Admitting: *Deleted

## 2017-05-16 DIAGNOSIS — E538 Deficiency of other specified B group vitamins: Secondary | ICD-10-CM | POA: Diagnosis not present

## 2017-05-16 MED ORDER — CYANOCOBALAMIN 1000 MCG/ML IJ SOLN
1000.0000 ug | Freq: Once | INTRAMUSCULAR | Status: AC
  Administered 2017-05-16: 1000 ug via INTRAMUSCULAR

## 2017-05-16 NOTE — Progress Notes (Signed)
Per orders of Dr. Clent RidgesFry, weekly injection of B12 given by Starla Linkarolyn J Kaius Daino. Last injection 05/09/17. Patient tolerated injection well.  Starla Linkarolyn J Ellyce Lafevers, RN

## 2017-05-22 ENCOUNTER — Ambulatory Visit (INDEPENDENT_AMBULATORY_CARE_PROVIDER_SITE_OTHER): Payer: BLUE CROSS/BLUE SHIELD | Admitting: *Deleted

## 2017-05-22 DIAGNOSIS — E538 Deficiency of other specified B group vitamins: Secondary | ICD-10-CM | POA: Diagnosis not present

## 2017-05-22 MED ORDER — CYANOCOBALAMIN 1000 MCG/ML IJ SOLN
1000.0000 ug | Freq: Once | INTRAMUSCULAR | Status: AC
  Administered 2017-05-22: 1000 ug via INTRAMUSCULAR

## 2017-05-22 NOTE — Progress Notes (Signed)
Per orders of Dr. Fry, injection of B12 given by Arshia Rondon J Wilford Merryfield. Patient tolerated injection well.  Safir Michalec J Mandisa Persinger, RN 

## 2017-05-29 ENCOUNTER — Ambulatory Visit (INDEPENDENT_AMBULATORY_CARE_PROVIDER_SITE_OTHER): Payer: BLUE CROSS/BLUE SHIELD

## 2017-05-29 DIAGNOSIS — E538 Deficiency of other specified B group vitamins: Secondary | ICD-10-CM | POA: Diagnosis not present

## 2017-05-29 MED ORDER — CYANOCOBALAMIN 1000 MCG/ML IJ SOLN
1000.0000 ug | Freq: Once | INTRAMUSCULAR | Status: AC
  Administered 2017-05-29: 1000 ug via INTRAMUSCULAR

## 2017-06-05 ENCOUNTER — Ambulatory Visit (INDEPENDENT_AMBULATORY_CARE_PROVIDER_SITE_OTHER): Payer: BLUE CROSS/BLUE SHIELD

## 2017-06-05 DIAGNOSIS — E538 Deficiency of other specified B group vitamins: Secondary | ICD-10-CM

## 2017-06-05 MED ORDER — CYANOCOBALAMIN 1000 MCG/ML IJ SOLN
1000.0000 ug | Freq: Once | INTRAMUSCULAR | Status: AC
  Administered 2017-06-05: 1000 ug via INTRAMUSCULAR

## 2017-06-13 ENCOUNTER — Ambulatory Visit: Payer: BLUE CROSS/BLUE SHIELD | Admitting: Internal Medicine

## 2017-06-13 ENCOUNTER — Other Ambulatory Visit (INDEPENDENT_AMBULATORY_CARE_PROVIDER_SITE_OTHER): Payer: BLUE CROSS/BLUE SHIELD

## 2017-06-13 ENCOUNTER — Ambulatory Visit (INDEPENDENT_AMBULATORY_CARE_PROVIDER_SITE_OTHER): Payer: BLUE CROSS/BLUE SHIELD | Admitting: *Deleted

## 2017-06-13 ENCOUNTER — Encounter: Payer: Self-pay | Admitting: Internal Medicine

## 2017-06-13 VITALS — BP 96/68 | HR 72 | Ht 60.0 in | Wt 87.8 lb

## 2017-06-13 DIAGNOSIS — D508 Other iron deficiency anemias: Secondary | ICD-10-CM

## 2017-06-13 DIAGNOSIS — E538 Deficiency of other specified B group vitamins: Secondary | ICD-10-CM | POA: Diagnosis not present

## 2017-06-13 DIAGNOSIS — Z8619 Personal history of other infectious and parasitic diseases: Secondary | ICD-10-CM

## 2017-06-13 DIAGNOSIS — K581 Irritable bowel syndrome with constipation: Secondary | ICD-10-CM | POA: Diagnosis not present

## 2017-06-13 DIAGNOSIS — Z1211 Encounter for screening for malignant neoplasm of colon: Secondary | ICD-10-CM | POA: Diagnosis not present

## 2017-06-13 LAB — IGA: IgA: 64 mg/dL — ABNORMAL LOW (ref 68–378)

## 2017-06-13 LAB — CBC WITH DIFFERENTIAL/PLATELET
BASOS PCT: 0.9 % (ref 0.0–3.0)
Basophils Absolute: 0 10*3/uL (ref 0.0–0.1)
EOS ABS: 0 10*3/uL (ref 0.0–0.7)
EOS PCT: 0.9 % (ref 0.0–5.0)
HCT: 42.8 % (ref 36.0–46.0)
Hemoglobin: 14.4 g/dL (ref 12.0–15.0)
LYMPHS ABS: 2.1 10*3/uL (ref 0.7–4.0)
Lymphocytes Relative: 41.9 % (ref 12.0–46.0)
MCHC: 33.5 g/dL (ref 30.0–36.0)
MCV: 98.1 fl (ref 78.0–100.0)
MONO ABS: 0.5 10*3/uL (ref 0.1–1.0)
Monocytes Relative: 10.2 % (ref 3.0–12.0)
NEUTROS ABS: 2.3 10*3/uL (ref 1.4–7.7)
Neutrophils Relative %: 46.1 % (ref 43.0–77.0)
PLATELETS: 333 10*3/uL (ref 150.0–400.0)
RBC: 4.37 Mil/uL (ref 3.87–5.11)
RDW: 13.9 % (ref 11.5–15.5)
WBC: 5 10*3/uL (ref 4.0–10.5)

## 2017-06-13 LAB — FERRITIN: Ferritin: 52.6 ng/mL (ref 10.0–291.0)

## 2017-06-13 MED ORDER — CYANOCOBALAMIN 1000 MCG/ML IJ SOLN
1000.0000 ug | Freq: Once | INTRAMUSCULAR | Status: AC
  Administered 2017-06-13: 1000 ug via INTRAMUSCULAR

## 2017-06-13 MED ORDER — LINACLOTIDE 290 MCG PO CAPS: 290.0000 ug | ORAL_CAPSULE | Freq: Every day | ORAL | 0 refills | Status: DC

## 2017-06-13 NOTE — Patient Instructions (Signed)
Your physician has requested that you go to the basement for lab work before leaving today.   We are giving you samples to try :  Linzess 290 mcg Take one daily before food.   Your provider has ordered Cologuard testing as an option for colon cancer screening. This is performed by [facility] and may be out of network with your insurance. PRIOR to completing the test, it is YOUR responsibility to contact your insurance about covered benefits for this test. Your out of pocket expense could be anywhere from $0.00 to $649.00.   When you call to check coverage with your insurer, please provide the following information:   -The ONLY provider of Cologuard is [facility]  - CPT code for Cologuard is [phone].  [facility] NPI # [id number]  -[facility] Tax ID # [id number]   We have already sent your demographic and insurance information to [facility] (phone number [phone]) and they should contact you within the next week regarding your test. If you have not heard from them within the next week, please call our office at [phone].  I appreciate the opportunity to care for you. Stan Headarl Gessner, MD, [facility]

## 2017-06-13 NOTE — Progress Notes (Signed)
Emma Palmer 51 y.o. 03/31/1966 [id number] Referred by: Nelwyn Salisbury, MD  Assessment & Plan:   Encounter Diagnoses  Name Primary?  . Irritable bowel syndrome with constipation Yes  . Other iron deficiency anemia   . History of Helicobacter pylori infection   . Colon cancer screening     Somewhat complicated situation in a patient with history of eating disorder, probably has nutritional iron deficiency though blood loss not ruled out.  She declines a colonoscopy but says she would do one if she had noninvasive testing that were positive.  She has irritable bowel syndrome-like findings with constipation predominant features.    My plans are to have her do a Cologuard she will check with her insurance company about this.  I think it would be covered from a screening perspective and I think we have enough overall history to qualify this risk screening test even though it would be better to have a colonoscopy she says she will not do on an less than noninvasive test is positive.  She has been treated multiple times for H. pylori which is probably not persistent.  I think it would be good to determine if she indeed still has H. pylori so a stool antigen test will be done.  Additional labs to include CBC, ferritin, TTG and IgA.  TTG IgG antibodies were negative a full celiac panel was ordered but not completed for some reason last year, I had done duodenal biopsies in 2006 and these were normal.  Celiac disease seems very unlikely but I think taking an extra step to evaluate with antibodies IgA derived is reasonable.  Linzess 290 ug daily samples qAM insetad of dulcolax for constipation.  She declined a rectal exam which I recommended to try to understand anorectal function more as it relates to her constipation.  More accurate but invasive testing would be an anorectal manometry she is not inclined to do that either.  Further plans pending the above results  I appreciate the  opportunity to care for this patient. CC: Nelwyn Salisbury, MD   Subjective:   Chief Complaint: Stomach problems naval and upper intestine pain  HPI The patient is a very nice single 51 year old white woman with a history of bulimia and still some eating disorder struggles, who has a history of chronic recurrent bloating and GI complaints that she often attributes to H. pylori.  At some point in her past history she was diagnosed with H. pylori by serologies.  She gets sort of a burning periumbilical or epigastric pain and perhaps some bloating and thinks it is the H. pylori, she convinced Dr. Clent Ridges to treat her empirically for that with a Prevpac earlier this year.  It seemed to help for a while but the symptoms returned and she said she could not tolerate another pack of medications when tried.  She is having those problems again.  At upper endoscopy in 2006 she did not seem to have gastritis and she had negative duodenal biopsies.  She suffers with chronic constipation, currently treating with Dulcolax tablets, taking it before bedtime  but says that is interfering with her life because she has to wait until she defecates to leave the house.  She was seen by Dr. Ave Filter last year because of iron deficiency.  He suspected nutritional likely giving her eating problems.  He mentioned the possibility of a intravenous iron but she was afraid of that and did not pursue it.  She is also B12 deficient.  Her diet is poor she has fruit spinach tomato and coffee for breakfast, she eats 3 green peppers sliced onion carrots marinara sauce and Parmesan cheese with occasional soup for lunch has an afternoon snack of a bowl of fruit and tends not to eat supper.  She has eliminated carbonated beverages.  She only drinks a little bit of water because it nauseates her her menstrual history is that from age [age] and never very heavy.  GI review of systems is otherwise notable for constant bloating as mentioned above.  Wt  Readings from Last 3 Encounters:  06/13/17 87 lb 12.8 oz (39.8 kg)  04/11/17 86 lb (39 kg)  03/28/17 85 lb (38.6 kg)    Allergies  Allergen Reactions  . Other     Dioxide hyclate  . Restasis [Cyclosporine] Other (See Comments)    Visual disturbances    Current Meds  Medication Sig  . bimatoprost (LATISSE) 0.03 % ophthalmic solution Place 1 application into both eyes at bedtime. Place one drop on applicator and apply evenly along the skin of the upper eyelid at base of eyelashes once daily at bedtime; repeat procedure for second eye (use a clean applicator).  . ferrous sulfate 300 (60 Fe) MG/5ML syrup Take 300 mg by mouth daily.  . temazepam (RESTORIL) 30 MG capsule TAKE ONE CAPSULE AT BEDTIME   Past Medical History:  Diagnosis Date  . Anxiety   . B12 deficiency   . Bulimia   . Depression   . GERD (gastroesophageal reflux disease)   . Glaucoma   . H. pylori infection    + serolgies  . Headache(784.0)   . Hip dysplasia    left - dr supple  . Hyperlipidemia   . Intraocular pressure increase    dr Hazle Quantdigby  . Iron deficiency anemia   . Nephrolithiasis    hx, dr Aldean Astkimbrough  . OP (osteoporosis) 10-24-06   dexa    Past Surgical History:  Procedure Laterality Date  . ESOPHAGOGASTRODUODENOSCOPY  05-06-05   dr Leone Payorgessner -normal including duodenal bxs  . LITHOTRIPSY     stent inserted   Social History   Social History Narrative   Single lives alone, no children   Unemployed since 2006   No alcohol   3 caffeinated beverages daily   No tobacco no drugs   06/13/2017   family history includes Heart attack (age of onset: 11059) in her father; Heart disease in her maternal grandmother and paternal grandmother; Hyperlipidemia in her mother; Hypertension in her father; Stroke in her maternal grandfather, maternal grandmother, and mother.   Review of Systems She has a periodic intermittent rash on the right arm, she suffers from migraine headaches, she has anxiety and her eating  disorder.  She urinates frequently.  She reports seeing a urologist and they recommended a medication and she decided not to take that.  Insomnia.  Depressed mood at times fatigue some back pain.  All other review of systems negative or as per HPI.  Objective:   Physical Exam @BP  96/68   Pulse 72   Ht 5' (1.524 m)   Wt 87 lb 12.8 oz (39.8 kg)   LMP 10/27/2010   BMI 17.15 kg/m @  General:  Well-developed, well-nourished and in no acute distress Eyes:  anicteric. ENT:   Mouth and posterior pharynx free of lesions.  Neck:   supple w/o thyromegaly or mass.  Lungs: Clear to auscultation bilaterally. Heart:  S1S2, no rubs, murmurs, gallops. Abdomen:  soft, non-tender, no hepatosplenomegaly,  hernia, or mass and BS+.  Rectal: declined by the patient Lymph:  no cervical or supraclavicular adenopathy. Extremities:   no edema, cyanosis or clubbing Skin   no rash. Neuro:  A&O x 3.  Psych:  appropriate mood and  Affect.   Data Reviewed:  See HPI. CT abdomen and pelvis 2012 dysplastic left femoral head with small pericardial effusion and prominent stool otherwise negative  B12 level 683 Dec 15, 2016 ferritin was 49 June 07, 2016 cortisol level was normal hemoglobin 13.2 with an MCV 95 November 2017

## 2017-06-13 NOTE — Progress Notes (Signed)
Per orders of Dr. Jordan, injection of B12 given by Seren Chaloux J Chanin Frumkin. Patient tolerated injection well.  Cinque Begley J Oluwasemilore Bahl, RN  

## 2017-06-14 LAB — TISSUE TRANSGLUTAMINASE, IGA: (tTG) Ab, IgA: 1 U/mL

## 2017-06-15 NOTE — Progress Notes (Signed)
My Chart note Blood tests good Waiting on H pylori resullts

## 2017-06-16 ENCOUNTER — Encounter: Payer: Self-pay | Admitting: Internal Medicine

## 2017-06-16 ENCOUNTER — Encounter: Payer: Self-pay | Admitting: Family Medicine

## 2017-06-16 NOTE — Telephone Encounter (Signed)
Yes, have her set up a well exam soon

## 2017-06-19 ENCOUNTER — Ambulatory Visit (INDEPENDENT_AMBULATORY_CARE_PROVIDER_SITE_OTHER): Payer: BLUE CROSS/BLUE SHIELD

## 2017-06-19 ENCOUNTER — Other Ambulatory Visit: Payer: BLUE CROSS/BLUE SHIELD

## 2017-06-19 DIAGNOSIS — Z8619 Personal history of other infectious and parasitic diseases: Secondary | ICD-10-CM

## 2017-06-19 DIAGNOSIS — E538 Deficiency of other specified B group vitamins: Secondary | ICD-10-CM | POA: Diagnosis not present

## 2017-06-19 MED ORDER — CYANOCOBALAMIN 1000 MCG/ML IJ SOLN
1000.0000 ug | Freq: Once | INTRAMUSCULAR | Status: AC
  Administered 2017-06-19: 1000 ug via INTRAMUSCULAR

## 2017-06-20 ENCOUNTER — Ambulatory Visit: Payer: BLUE CROSS/BLUE SHIELD

## 2017-06-20 LAB — HELICOBACTER PYLORI  SPECIAL ANTIGEN
MICRO NUMBER:: 81354962
RESULT: DETECTED — AB
SPECIMEN QUALITY: ADEQUATE

## 2017-06-22 ENCOUNTER — Other Ambulatory Visit: Payer: Self-pay

## 2017-06-22 DIAGNOSIS — A048 Other specified bacterial intestinal infections: Secondary | ICD-10-CM

## 2017-06-22 MED ORDER — METRONIDAZOLE 250 MG PO TABS: 250.0000 mg | ORAL_TABLET | Freq: Four times a day (QID) | ORAL | 0 refills | Status: AC

## 2017-06-22 MED ORDER — DOXYCYCLINE HYCLATE 100 MG PO CAPS: 100.0000 mg | ORAL_CAPSULE | Freq: Two times a day (BID) | ORAL | 0 refills | Status: AC

## 2017-06-22 MED ORDER — BISMUTH SUBSALICYLATE 262 MG PO TABS: 2.0000 | ORAL_TABLET | Freq: Four times a day (QID) | ORAL | 0 refills | Status: AC

## 2017-06-22 MED ORDER — OMEPRAZOLE 20 MG PO CPDR: 20.0000 mg | DELAYED_RELEASE_CAPSULE | Freq: Two times a day (BID) | ORAL | 0 refills | Status: DC

## 2017-06-22 NOTE — Progress Notes (Signed)
Call her - still has H pylori  1) Omeprazole 20 mg 2 times a day x 14 d 2) Pepto Bismol 2 tabs (262 mg each) 4 times a day x 14 d 3) Metronidazole 250 mg 4 times a day x 14 d 4) doxycycline 100 mg 2 times a day x 14 d  After 14 d stop omeprazole also  In 4 weeks after treatment completed do H. Pylori stool antigen - dx H. Pylori gastritis  Please have her set up an OV next available also

## 2017-06-27 ENCOUNTER — Encounter: Payer: BLUE CROSS/BLUE SHIELD | Admitting: Family Medicine

## 2017-06-27 ENCOUNTER — Telehealth: Payer: Self-pay | Admitting: Family Medicine

## 2017-06-27 NOTE — Telephone Encounter (Signed)
Copied from CRM [id number]. Topic: Quick Communication - See Telephone Encounter >> Jun 27, 2017 11:42 AM Emma Palmer, Emma Palmer wrote: CRM for notification. See Telephone encounter for: 06/27/17. Patient cancel her physical due to weather and also because she does not want to have a physical and just come in for fasting labs and B12 shot. Patient lost a week of her B12 and wants to come in sooner. Please call patient to see when she can come in for labs and to see Dr. Clent RidgesFry, thanks.

## 2017-06-28 ENCOUNTER — Encounter: Payer: BLUE CROSS/BLUE SHIELD | Admitting: Family Medicine

## 2017-06-28 NOTE — Telephone Encounter (Signed)
Called patient and schedule next B12 injection for next week and rescheduled CPE.

## 2017-06-30 ENCOUNTER — Telehealth: Payer: Self-pay

## 2017-06-30 NOTE — Telephone Encounter (Signed)
[facility]coming [facility] from [facility]. Cologuard order has been cancelled per pt request. I contacted Clydie BraunKaren. She states at this time she has cancelled the order due to insurance not covering the test. Pt has a follow up on 08-14-2017 and will discuss in more detail at that time.

## 2017-07-03 ENCOUNTER — Ambulatory Visit (INDEPENDENT_AMBULATORY_CARE_PROVIDER_SITE_OTHER): Payer: BLUE CROSS/BLUE SHIELD | Admitting: *Deleted

## 2017-07-03 DIAGNOSIS — E538 Deficiency of other specified B group vitamins: Secondary | ICD-10-CM | POA: Diagnosis not present

## 2017-07-03 MED ORDER — CYANOCOBALAMIN 1000 MCG/ML IJ SOLN
1000.0000 ug | Freq: Once | INTRAMUSCULAR | Status: AC
  Administered 2017-07-03: 1000 ug via INTRAMUSCULAR

## 2017-07-03 NOTE — Progress Notes (Signed)
Patient here for monthly B12 injection.  Last injection 06/19/17. Pt missed last weeks injection d/t the snow and will miss next weeks injection d/t the Holiday. Pt coming in to be seen on 07/17/17 and will get an injection at that time. Pt advised to get back on weekly rotation at that time, we do not do double injections when missing injections, she will need to just get back on normal rotation after the Holidays. Patient tolerated injection well.  Nadara EatonAshtyn Daquavion Catala, CMA

## 2017-07-05 ENCOUNTER — Telehealth: Payer: Self-pay | Admitting: Family Medicine

## 2017-07-05 MED ORDER — TEMAZEPAM 30 MG PO CAPS: 30.0000 mg | ORAL_CAPSULE | Freq: Every day | ORAL | 3 refills | Status: DC

## 2017-07-05 NOTE — Telephone Encounter (Signed)
Rx was last refilled [id number] for 30 days with 5 refills. Call pharmacy and find out how many refills she had left over that they couldn't refill. Called pharmacy pt has 3 months worth of refills left. Rx called in for pt for 30 day supply with 3 refills [facility].

## 2017-07-05 NOTE — Telephone Encounter (Signed)
Copied from CRM [id number]. Topic: Quick Communication - Rx Refill/Question >> Jul 05, 2017 12:06 PM Clack, Princella PellegriniJessica D wrote: Has the patient contacted their pharmacy? Yes.     (Agent: If no, request that the patient contact the pharmacy for the refill.)   Preferred Pharmacy (with phone number or street name): [facility] [address], [place], [place] [address] [phone]   Pt states [facility] not [facility] temazepam (RESTORIL) 30 MG capsule [[id number]][[id number]] in stock but [facility]. She would for Dr. Clent RidgesFry to call [facility], [facility] is not able to [facility] to [facility].   Agent: Please be advised that RX refills may take up to 3 business days. We ask that you follow-up with your pharmacy.

## 2017-07-13 ENCOUNTER — Ambulatory Visit (INDEPENDENT_AMBULATORY_CARE_PROVIDER_SITE_OTHER): Payer: BLUE CROSS/BLUE SHIELD

## 2017-07-13 DIAGNOSIS — E538 Deficiency of other specified B group vitamins: Secondary | ICD-10-CM

## 2017-07-13 MED ORDER — CYANOCOBALAMIN 1000 MCG/ML IJ SOLN
200.0000 ug | Freq: Once | INTRAMUSCULAR | Status: AC
  Administered 2017-07-13: 200 ug via INTRAMUSCULAR

## 2017-07-13 MED ORDER — CYANOCOBALAMIN 1000 MCG/ML IJ SOLN: 1000.0000 ug | Freq: Once | INTRAMUSCULAR | Status: DC

## 2017-07-17 ENCOUNTER — Encounter: Payer: BLUE CROSS/BLUE SHIELD | Admitting: Family Medicine

## 2017-07-19 ENCOUNTER — Ambulatory Visit (INDEPENDENT_AMBULATORY_CARE_PROVIDER_SITE_OTHER): Payer: BLUE CROSS/BLUE SHIELD

## 2017-07-19 DIAGNOSIS — E538 Deficiency of other specified B group vitamins: Secondary | ICD-10-CM | POA: Diagnosis not present

## 2017-07-19 MED ORDER — CYANOCOBALAMIN 1000 MCG/ML IJ SOLN
1000.0000 ug | Freq: Once | INTRAMUSCULAR | Status: AC
  Administered 2017-07-19: 1000 ug via INTRAMUSCULAR

## 2017-07-19 NOTE — Patient Instructions (Signed)
Pt had an appointment for her Vit B 12 injection, pt tolerated the injection with no problems

## 2017-07-20 ENCOUNTER — Ambulatory Visit: Payer: BLUE CROSS/BLUE SHIELD

## 2017-07-27 ENCOUNTER — Encounter: Payer: BLUE CROSS/BLUE SHIELD | Admitting: Family Medicine

## 2017-07-27 ENCOUNTER — Ambulatory Visit: Payer: BLUE CROSS/BLUE SHIELD

## 2017-08-02 ENCOUNTER — Ambulatory Visit (INDEPENDENT_AMBULATORY_CARE_PROVIDER_SITE_OTHER): Payer: BLUE CROSS/BLUE SHIELD | Admitting: Family Medicine

## 2017-08-02 DIAGNOSIS — E538 Deficiency of other specified B group vitamins: Secondary | ICD-10-CM | POA: Diagnosis not present

## 2017-08-02 MED ORDER — CYANOCOBALAMIN 1000 MCG/ML IJ SOLN
1000.0000 ug | Freq: Once | INTRAMUSCULAR | Status: AC
  Administered 2017-08-02: 1000 ug via INTRAMUSCULAR

## 2017-08-03 ENCOUNTER — Ambulatory Visit: Payer: BLUE CROSS/BLUE SHIELD

## 2017-08-03 ENCOUNTER — Encounter: Payer: BLUE CROSS/BLUE SHIELD | Admitting: Family Medicine

## 2017-08-08 ENCOUNTER — Ambulatory Visit (INDEPENDENT_AMBULATORY_CARE_PROVIDER_SITE_OTHER): Payer: BLUE CROSS/BLUE SHIELD | Admitting: Family Medicine

## 2017-08-08 DIAGNOSIS — E538 Deficiency of other specified B group vitamins: Secondary | ICD-10-CM

## 2017-08-08 MED ORDER — CYANOCOBALAMIN 1000 MCG/ML IJ SOLN
1000.0000 ug | Freq: Once | INTRAMUSCULAR | Status: AC
  Administered 2017-08-08: 1000 ug via INTRAMUSCULAR

## 2017-08-09 ENCOUNTER — Encounter: Payer: BLUE CROSS/BLUE SHIELD | Admitting: Family Medicine

## 2017-08-11 ENCOUNTER — Telehealth: Payer: Self-pay | Admitting: Family Medicine

## 2017-08-11 ENCOUNTER — Encounter: Payer: Self-pay | Admitting: Family Medicine

## 2017-08-11 NOTE — Telephone Encounter (Signed)
° °  Pt called in to request lab orders placed. Pt says that she is coming in on Monday for B-12 injection. She would like to go ahead and have any labs that's needed. Pt would like for provider to place orders. She said that she would like to have her iron checked.    CB: [phone]

## 2017-08-11 NOTE — Telephone Encounter (Signed)
Patient is due for CPE and labs. She has canceled 4 CPEs this month. Please advise request for labs.

## 2017-08-14 ENCOUNTER — Ambulatory Visit (INDEPENDENT_AMBULATORY_CARE_PROVIDER_SITE_OTHER): Payer: BLUE CROSS/BLUE SHIELD | Admitting: Family Medicine

## 2017-08-14 ENCOUNTER — Ambulatory Visit: Payer: BLUE CROSS/BLUE SHIELD | Admitting: Internal Medicine

## 2017-08-14 ENCOUNTER — Other Ambulatory Visit: Payer: Self-pay | Admitting: Family Medicine

## 2017-08-14 DIAGNOSIS — E782 Mixed hyperlipidemia: Secondary | ICD-10-CM

## 2017-08-14 DIAGNOSIS — E538 Deficiency of other specified B group vitamins: Secondary | ICD-10-CM | POA: Diagnosis not present

## 2017-08-14 LAB — CBC WITH DIFFERENTIAL/PLATELET
BASOS PCT: 0.9 % (ref 0.0–3.0)
Basophils Absolute: 0 10*3/uL (ref 0.0–0.1)
Eosinophils Absolute: 0 10*3/uL (ref 0.0–0.7)
Eosinophils Relative: 1.1 % (ref 0.0–5.0)
HCT: 41.2 % (ref 36.0–46.0)
Hemoglobin: 13.9 g/dL (ref 12.0–15.0)
LYMPHS ABS: 2 10*3/uL (ref 0.7–4.0)
Lymphocytes Relative: 49.1 % — ABNORMAL HIGH (ref 12.0–46.0)
MCHC: 33.7 g/dL (ref 30.0–36.0)
MCV: 96.7 fl (ref 78.0–100.0)
MONO ABS: 0.4 10*3/uL (ref 0.1–1.0)
Monocytes Relative: 9 % (ref 3.0–12.0)
NEUTROS ABS: 1.6 10*3/uL (ref 1.4–7.7)
NEUTROS PCT: 39.9 % — AB (ref 43.0–77.0)
Platelets: 296 10*3/uL (ref 150.0–400.0)
RBC: 4.26 Mil/uL (ref 3.87–5.11)
RDW: 13.5 % (ref 11.5–15.5)
WBC: 4.1 10*3/uL (ref 4.0–10.5)

## 2017-08-14 LAB — LIPID PANEL
CHOL/HDL RATIO: 3
Cholesterol: 248 mg/dL — ABNORMAL HIGH (ref 0–200)
HDL: 73.3 mg/dL (ref >39.00)
LDL Cholesterol: 162 mg/dL — ABNORMAL HIGH (ref 0–99)
NONHDL: 174.87
TRIGLYCERIDES: 65 mg/dL (ref 0.0–149.0)
VLDL: 13 mg/dL (ref 0.0–40.0)

## 2017-08-14 LAB — BASIC METABOLIC PANEL
BUN: 4 mg/dL — ABNORMAL LOW (ref 6–23)
CALCIUM: 9.1 mg/dL (ref 8.4–10.5)
CHLORIDE: 104 meq/L (ref 96–112)
CO2: 30 meq/L (ref 19–32)
Creatinine, Ser: 0.79 mg/dL (ref 0.40–1.20)
GFR: 81.55 mL/min (ref >60.00)
Glucose, Bld: 73 mg/dL (ref 70–99)
Potassium: 4 mEq/L (ref 3.5–5.1)
SODIUM: 141 meq/L (ref 135–145)

## 2017-08-14 LAB — HEPATIC FUNCTION PANEL
ALBUMIN: 4.1 g/dL (ref 3.5–5.2)
ALT: 14 U/L (ref 0–35)
AST: 18 U/L (ref 0–37)
Alkaline Phosphatase: 69 U/L (ref 39–117)
Bilirubin, Direct: 0.1 mg/dL (ref 0.0–0.3)
Total Bilirubin: 0.7 mg/dL (ref 0.2–1.2)
Total Protein: 6.1 g/dL (ref 6.0–8.3)

## 2017-08-14 LAB — VITAMIN B12: VITAMIN B 12: 1432 pg/mL — AB (ref 211–911)

## 2017-08-14 LAB — TSH: TSH: 2.65 u[IU]/mL (ref 0.35–4.50)

## 2017-08-14 MED ORDER — CYANOCOBALAMIN 1000 MCG/ML IJ SOLN
1000.0000 ug | Freq: Once | INTRAMUSCULAR | Status: AC
  Administered 2017-08-14: 1000 ug via INTRAMUSCULAR

## 2017-08-14 NOTE — Telephone Encounter (Signed)
This has been addressed pt was seen today for lab work

## 2017-08-14 NOTE — Progress Notes (Signed)
Per orders of Dr. Clent RidgesFry, injection of vitamin b 12 given by Aniceto BossNIMMONS, Consuella Scurlock ANN. Patient tolerated injection well.

## 2017-08-14 NOTE — Telephone Encounter (Signed)
The orders were placed  ?

## 2017-08-14 NOTE — Telephone Encounter (Signed)
Sent to PCP OK to schedule labs w/o OV or will pt need OV?

## 2017-08-14 NOTE — Telephone Encounter (Signed)
Yes she can schedule labs for now

## 2017-08-16 ENCOUNTER — Encounter: Payer: BLUE CROSS/BLUE SHIELD | Admitting: Family Medicine

## 2017-08-23 ENCOUNTER — Ambulatory Visit: Payer: BLUE CROSS/BLUE SHIELD

## 2017-08-24 NOTE — Telephone Encounter (Unsigned)
Copied from CRM [phone]. Topic: Appointment Scheduling - Scheduling Inquiry for Clinic >> Aug 22, 2017  9:27 AM Landry MellowFoltz, Melissa J wrote: Reason for CRM: [id number] Pt would like to have b12 appt. Please call to set up   >> Aug 22, 2017 10:57 AM Cox, Armando GangSheena H, CMA wrote: Pt's schedule for B12 in unclear. Please advise. Thanks!

## 2017-09-26 ENCOUNTER — Encounter: Payer: BLUE CROSS/BLUE SHIELD | Admitting: Family Medicine

## 2017-09-29 ENCOUNTER — Other Ambulatory Visit: Payer: Self-pay | Admitting: Family Medicine

## 2017-10-02 NOTE — Telephone Encounter (Signed)
Last OV 04/09/2017   Last refilled 07/05/2017 30 with 3 refills   Sent to PCP for approval

## 2017-10-03 ENCOUNTER — Encounter: Payer: Self-pay | Admitting: Osteopathic Medicine

## 2017-10-03 ENCOUNTER — Telehealth: Payer: Self-pay | Admitting: Osteopathic Medicine

## 2017-10-03 ENCOUNTER — Ambulatory Visit: Payer: BLUE CROSS/BLUE SHIELD | Admitting: Osteopathic Medicine

## 2017-10-03 VITALS — BP 100/67 | HR 60 | Temp 98.0°F | Ht 60.0 in | Wt 88.6 lb

## 2017-10-03 DIAGNOSIS — E538 Deficiency of other specified B group vitamins: Secondary | ICD-10-CM

## 2017-10-03 DIAGNOSIS — Z8619 Personal history of other infectious and parasitic diseases: Secondary | ICD-10-CM

## 2017-10-03 DIAGNOSIS — F332 Major depressive disorder, recurrent severe without psychotic features: Secondary | ICD-10-CM | POA: Diagnosis not present

## 2017-10-03 DIAGNOSIS — F5101 Primary insomnia: Secondary | ICD-10-CM

## 2017-10-03 DIAGNOSIS — Z8669 Personal history of other diseases of the nervous system and sense organs: Secondary | ICD-10-CM | POA: Diagnosis not present

## 2017-10-03 DIAGNOSIS — K3189 Other diseases of stomach and duodenum: Secondary | ICD-10-CM | POA: Diagnosis not present

## 2017-10-03 DIAGNOSIS — R350 Frequency of micturition: Secondary | ICD-10-CM | POA: Diagnosis not present

## 2017-10-03 HISTORY — DX: Personal history of other infectious and parasitic diseases: Z86.19

## 2017-10-03 LAB — POCT URINALYSIS DIPSTICK
Bilirubin, UA: NEGATIVE
Blood, UA: NEGATIVE
GLUCOSE UA: NEGATIVE
KETONES UA: NEGATIVE
Leukocytes, UA: NEGATIVE
Nitrite, UA: NEGATIVE
Protein, UA: NEGATIVE
SPEC GRAV UA: 1.015 (ref 1.010–1.025)
Urobilinogen, UA: 0.2 E.U./dL
pH, UA: 7.5 (ref 5.0–8.0)

## 2017-10-03 MED ORDER — VORTIOXETINE HBR 10 MG PO TABS: 10.0000 mg | ORAL_TABLET | Freq: Every day | ORAL | 1 refills | Status: DC

## 2017-10-03 NOTE — Patient Instructions (Signed)
Plan:  For depression:  Start Trintellix prescription for depression  Would strongly consider referral to counselor or psychiatrist if not improving   For immediate mental health services:   Any emergency room  [facility], [address], [place], [place] [address], [phone]  [facility], [address], [place], [place] [address], [phone]  For abdominal symptoms:  Try peppermint oil and probiotics daily - OTC supplementation  Would follow up with GI about a colonoscopy

## 2017-10-03 NOTE — Telephone Encounter (Signed)
Received confirmation that Trintellix was approved from  Approvedtoday  Effective from 10/03/2017 through 10/01/2020.  Reference ID: [id number].

## 2017-10-03 NOTE — Telephone Encounter (Signed)
Call in #30 with 5 rf 

## 2017-10-03 NOTE — Progress Notes (Signed)
HPI: Emma Palmer is a 52 y.o. female who  has a past medical history of Anxiety, B12 deficiency, Bulimia, Depression, GERD (gastroesophageal reflux disease), Glaucoma, H. pylori infection, Headache(784.0), Hip dysplasia, Hyperlipidemia, Intraocular pressure increase, Iron deficiency anemia, Nephrolithiasis, and OP (osteoporosis) (10-24-06).  she presents to [facility] today, 10/03/17,  for chief complaint of: Establish care  NEUROLOGICAL  Insomnia- chronic Temapzepam 30 mg qhs Rx by previous Dr, looks like he refilled this 09/29/17 #30 for 5 refills based on phone notes  Headache/migraine - fairly good control   GASTROINTESTINAL Recurrent/resistant Hpylori persistent despite several treatments per patient - last treated 06/19/2017 and planned test of cure 6 weeks after that but I don't see results in the system for her. She reports lower abdominal pain, feeling of bloating. No real epigastric pain/belching or heartburn symptoms at this point. Previously following with GI, a colonoscopy was apparently suggested but she wouldrather do the stool tests. Apparently was  Rx Linzess at some point but laxatives as needed were helping   PSYCHIATRIC Longstanding depression - Prozac initially helped but then didn't when tired later in life. Others included Wellbutrin, Cymbalta. PHQ9 results are definitely concerning, we talked a lot about this. She states that she does have suicidal ideation, she has thought about suicide many times but no history of suicide attempts and no current plan was to get better ut she has felt this way for so long that she fears there may not be an option for her. She has spoken with many counselors overthe years, history of eating disorder which is mostly under control at this point   Past medical, surgical, social and family history reviewed:  Patient Active Problem List   Diagnosis Date Noted  . Arthralgia 01/06/2017  . Iron deficiency  anemia 06/07/2016  . OAB (overactive bladder) 06/20/2014  . GERD (gastroesophageal reflux disease) 04/15/2014  . Vitamin B12 deficiency 02/10/2014  . Bulimia nervosa 02/10/2014  . ANXIETY STATE, UNSPECIFIED 04/09/2010  . SOLITARY BONE CYST 08/31/2007  . Hyperlipidemia 11/15/2006  . DEPRESSION 11/15/2006  . ARTHRITIS, RIGHT HIP 11/15/2006  . OSTEOPOROSIS 11/15/2006    Past Surgical History:  Procedure Laterality Date  . ESOPHAGOGASTRODUODENOSCOPY  05-06-05   dr Leone Payor -normal including duodenal bxs  . LITHOTRIPSY     stent inserted    Social History   Tobacco Use  . Smoking status: Former Smoker    Packs/day: 0.50    Types: Cigarettes  . Smokeless tobacco: Never Used  . Tobacco comment: 1/2 pack or less  Substance Use Topics  . Alcohol use: No    Alcohol/week: 0.0 oz    Family History  Problem Relation Age of Onset  . Hyperlipidemia Mother   . Stroke Mother   . Heart attack Father 63  . Hypertension Father   . Heart disease Maternal Grandmother   . Stroke Maternal Grandmother   . Stroke Maternal Grandfather   . Heart disease Paternal Grandmother   . Colon cancer Neg Hx      Current medication list and allergy/intolerance information reviewed:    Current Outpatient Medications  Medication Sig Dispense Refill  . bimatoprost (LATISSE) 0.03 % ophthalmic solution Place 1 application into both eyes at bedtime. Place one drop on applicator and apply evenly along the skin of the upper eyelid at base of eyelashes once daily at bedtime; repeat procedure for second eye (use a clean applicator). 3 mL 11  . ferrous sulfate 300 (60 Fe) MG/5ML syrup Take 300  mg by mouth daily.    Marland Kitchen. linaclotide (LINZESS) 290 MCG CAPS capsule Take 1 capsule (290 mcg total) by mouth daily before breakfast. 16 capsule 0  . omeprazole (PRILOSEC) 20 MG capsule Take 1 capsule (20 mg total) by mouth 2 (two) times daily for 14 days. 28 capsule 0  . temazepam (RESTORIL) 30 MG capsule Take 1 capsule (30  mg total) by mouth at bedtime. 30 capsule 3   No current facility-administered medications for this visit.     Allergies  Allergen Reactions  . Other     Dioxide hyclate  . Restasis [Cyclosporine] Other (See Comments)    Visual disturbances       Review of Systems:  Constitutional:  No  fever, no chills, No recent illness, No unintentional weight changes. +significant fatigue.   HEENT: No  headache, no vision change, no hearing change, No sore throat, No  sinus pressure  Cardiac: No  chest pain, No  pressure, No palpitations, No  Orthopnea  Respiratory:  No  shortness of breath. No  Cough  Gastrointestinal: +abdominal pain, No  nausea, No  vomiting,  No  blood in stool, No  diarrhea, No  constipation   Musculoskeletal: No new myalgia/arthralgia  Skin: No  Rash, +other wounds/concerning lesions - dent in the skin maybe not there a week ago   Genitourinary: No  incontinence, No  abnormal genital bleeding, No abnormal genital discharge, +urinary frequency would like this checked   Hem/Onc: No  easy bruising/bleeding,  Neurologic: No  weakness, No  dizziness  Psychiatric: +concerns with depression, No  concerns with anxiety, +sleep problems, No mood problems  Exam:  BP 100/67 (BP Location: Left Arm)   Pulse 60   Temp 98 F (36.7 C) (Oral)   Ht 5' (1.524 m)   Wt 88 lb 9.6 oz (40.2 kg)   LMP 10/27/2010   BMI 17.30 kg/m   Constitutional: VS see above. General Appearance: alert, well-developed, thin but not cachectic, NAD  Eyes: Normal lids and conjunctive, non-icteric sclera  Ears, Nose, Mouth, Throat: MMM, Normal external inspection ears/nares/mouth/lips/gums. TM normal bilaterally. Pharynx/tonsils no erythema, no exudate. Nasal mucosa normal.   Neck: No masses, trachea midline. No thyroid enlargement. No tenderness/mass appreciated. No lymphadenopathy  Respiratory: Normal respiratory effort. no wheeze, no rhonchi, no rales  Cardiovascular: S1/S2 normal, no  murmur, no rub/gallop auscultated. RRR  Gastrointestinal: Nontender, no masses. No hepatomegaly, no splenomegaly. No hernia appreciated. Bowel sounds normal. Rectal exam deferred.   Musculoskeletal: Gait normal. No clubbing/cyanosis of digits.   Neurological: Normal balance/coordination. No tremor  Skin: warm, dry, intact.  Psychiatric: Normal judgment/insight. Normal mood and affect. Oriented x3.    Results for orders placed or performed in visit on 10/03/17 (from the past 72 hour(s))  POCT Urinalysis Dipstick     Status: None   Collection Time: 10/03/17 12:08 PM  Result Value Ref Range   Color, UA YELLOW    Clarity, UA CLEAR    Glucose, UA NEGATIVE    Bilirubin, UA NEGATIVE    Ketones, UA NEGATIVE    Spec Grav, UA 1.015 1.010 - 1.025   Blood, UA NEGATIVE    pH, UA 7.5 5.0 - 8.0   Protein, UA NEGATIVE    Urobilinogen, UA 0.2 0.2 or 1.0 E.U./dL   Nitrite, UA NEGATIVE    Leukocytes, UA Negative Negative   Appearance     Odor        ASSESSMENT/PLAN: .  Severe episode of recurrent major depressive disorder,  without psychotic features (HCC)  Gastric irritation  Urinary frequency - Plan: POCT Urinalysis Dipstick  Primary insomnia  History of migraine  History of Helicobacter pylori infection       Patient Instructions  Plan:  For depression:  Start Trintellix prescription for depression  Would strongly consider referral to counselor or psychiatrist if not improving   For immediate mental health services:   Any emergency room  [facility], [address] [place], [place] [address], [phone]  [facility], [address], [place], [place] [address], [phone]  For abdominal symptoms:  Try peppermint oil and probiotics daily - OTC supplementation  Would follow up with GI about a colonoscopy      Meds ordered this encounter  Medications  . vortioxetine HBr (TRINTELLIX) 10 MG TABS tablet     Sig: Take 1 tablet (10 mg total) by mouth daily. Please run savings card with this prescription    Dispense:  30 tablet    Refill:  1      Visit summary with medication list and pertinent instructions was printed for patient to review. All questions at time of visit were answered - patient instructed to contact office with any additional concerns. ER/RTC precautions were reviewed with the patient.   Follow-up plan: Return in about 3 weeks (around 10/24/2017) for recheck depression on Trintellix, sooner if needed .    Please note: voice recognition software was used to produce this document, and typos may escape review. Please contact Dr. Lyn Hollingshead for any needed clarifications.

## 2017-10-26 ENCOUNTER — Other Ambulatory Visit: Payer: Self-pay | Admitting: Family Medicine

## 2017-10-27 NOTE — Telephone Encounter (Signed)
Sent to Pt's PCP

## 2017-11-07 ENCOUNTER — Telehealth: Payer: Self-pay | Admitting: Osteopathic Medicine

## 2017-11-07 NOTE — Telephone Encounter (Signed)
Pt called to cancel her appt with Dr.Alexander and states she seem her only one time and would like to swtich to Gena Frayharley Cummings IF that is okay?  Patient just felt like Dr.Alexander wasn't listening to her about her depression and B12 Please let me know if she can see Vinetta BergamoCharley and I will call patient and schedule if so? Thanks

## 2017-11-07 NOTE — Telephone Encounter (Signed)
Agree with Evlyn Clines. If a patient's goals aren't met, that's not a reason to switch providers, that's a reason to follow-up with their current provider to address their concerns.

## 2017-11-07 NOTE — Telephone Encounter (Signed)
Please see response

## 2017-11-07 NOTE — Telephone Encounter (Signed)
I think we should continue the practice of routing these requests to the providers.  I agree that patients should be encouraged to communicate with the provider if they have concerns such as not feeling listened to or not agreeing with the treatment plan. This is necessary for any therapeutic relationship.

## 2017-11-07 NOTE — Telephone Encounter (Signed)
This patient and I talked at great length about depression issues and we even adjusted her medicine. I would not recommend she switch wihtout giving me a chance to follow-up further on her complex issues - I recommended she see me about 3 weeks after her initial visit and this would have been approx 10/24/17.   If she feels this plan is not acceptable, she should find another office to establish care. Charley and I have largely stopped allowing patients to switch between us as our practices and personalities are quite similar.

## 2017-11-08 NOTE — Telephone Encounter (Signed)
Noted. She is free to make her own decisions on this matter.   Clinical note: last B12 levels on file were above normal range and in my clinical judgment our time was better spent discussing her severe depression issues.   Will remove myself as PCP of record

## 2017-11-08 NOTE — Telephone Encounter (Signed)
I called pt and offered to make her F/u with Dr.Alexander to go over her concerns and she would not. She feels like her concern about her B12 was dismissed and she was given depression meds that she does not want.

## 2017-11-14 ENCOUNTER — Ambulatory Visit: Payer: BLUE CROSS/BLUE SHIELD | Admitting: Osteopathic Medicine

## 2017-11-29 ENCOUNTER — Other Ambulatory Visit: Payer: Self-pay | Admitting: Family Medicine

## 2018-02-18 ENCOUNTER — Other Ambulatory Visit: Payer: Self-pay | Admitting: Family Medicine

## 2018-04-12 ENCOUNTER — Encounter: Payer: Self-pay | Admitting: Family Medicine

## 2018-04-12 ENCOUNTER — Ambulatory Visit: Payer: BLUE CROSS/BLUE SHIELD | Admitting: Family Medicine

## 2018-04-12 VITALS — BP 100/60 | HR 62 | Temp 98.7°F | Wt 91.0 lb

## 2018-04-12 DIAGNOSIS — G43909 Migraine, unspecified, not intractable, without status migrainosus: Secondary | ICD-10-CM | POA: Insufficient documentation

## 2018-04-12 DIAGNOSIS — G43911 Migraine, unspecified, intractable, with status migrainosus: Secondary | ICD-10-CM | POA: Diagnosis not present

## 2018-04-12 MED ORDER — KETOROLAC TROMETHAMINE 60 MG/2ML IM SOLN
60.0000 mg | Freq: Once | INTRAMUSCULAR | Status: AC
  Administered 2018-04-12: 60 mg via INTRAMUSCULAR

## 2018-04-12 NOTE — Progress Notes (Signed)
   Subjective:    Patient ID: Emma Palmer, female    DOB: 02/22/1966, 52 y.o.   MRN: [id number]  HPI Here for a migraine headache that started 2 days ago. This is generalized over the entire head and it causes a lot of nausea. She is sensitive to light. No auras.    Review of Systems  Constitutional: Negative.   Respiratory: Negative.   Cardiovascular: Negative.   Neurological: Positive for headaches.       Objective:   Physical Exam  Constitutional: She is oriented to person, place, and time.  In pain, photophobic   Eyes: Pupils are equal, round, and reactive to light. Conjunctivae and EOM are normal.  Neck: Neck supple. No thyromegaly present.  Cardiovascular: Normal rate, regular rhythm, normal heart sounds and intact distal pulses.  Pulmonary/Chest: Effort normal and breath sounds normal.  Lymphadenopathy:    She has no cervical adenopathy.  Neurological: She is alert and oriented to person, place, and time.          Assessment & Plan:  Migraine headache. Given a shot of Toradol. She can use Zofran for nausea.  Gershon Crane, MD

## 2018-04-30 ENCOUNTER — Other Ambulatory Visit: Payer: Self-pay | Admitting: Family Medicine

## 2018-05-01 NOTE — Telephone Encounter (Signed)
Call in #30 with 5 rf 

## 2018-05-01 NOTE — Telephone Encounter (Signed)
Dr. Fry please advise on refills. Thanks  

## 2018-06-21 ENCOUNTER — Encounter: Payer: Self-pay | Admitting: Family Medicine

## 2018-06-21 ENCOUNTER — Ambulatory Visit: Payer: BLUE CROSS/BLUE SHIELD | Admitting: Family Medicine

## 2018-06-21 VITALS — BP 104/64 | HR 57 | Temp 97.9°F | Wt 90.5 lb

## 2018-06-21 DIAGNOSIS — M2550 Pain in unspecified joint: Secondary | ICD-10-CM | POA: Diagnosis not present

## 2018-06-21 DIAGNOSIS — E538 Deficiency of other specified B group vitamins: Secondary | ICD-10-CM

## 2018-06-21 DIAGNOSIS — Z0000 Encounter for general adult medical examination without abnormal findings: Secondary | ICD-10-CM | POA: Diagnosis not present

## 2018-06-21 LAB — VITAMIN B12: Vitamin B-12: 746 pg/mL (ref 211–911)

## 2018-06-21 MED ORDER — MELOXICAM 15 MG PO TABS: 15.0000 mg | ORAL_TABLET | Freq: Every day | ORAL | 3 refills | Status: DC

## 2018-06-21 NOTE — Progress Notes (Signed)
   Subjective:    Patient ID: Emma Palmer, female    DOB: [birth year], 52 y.o.   MRN: [id number]  HPI Here for several issues. First she asks for a stool kit to screen for blood in the stool. She is worried about colon cancer although she has no GI symptoms. She refuses to ever get a colonoscopy, and her insurance wil not pay for a Cologuard test. Second she asks to check her B12 level, since this was last checked 11 months ago. Third she asks about pain and stiffness in numerous joints, especially the hands, ankles, feet, and lower back. She has tried Ibuprofen and Aleve with mixed results.    Review of Systems  Constitutional: Negative.   Respiratory: Negative.   Cardiovascular: Negative.   Gastrointestinal: Negative.   Musculoskeletal: Positive for arthralgias.       Objective:   Physical Exam  Constitutional: She appears well-developed and well-nourished.  Cardiovascular: Normal rate, regular rhythm, normal heart sounds and intact distal pulses.  Pulmonary/Chest: Effort normal and breath sounds normal.  Musculoskeletal:  The hands appear normal. The right ankle is slightly swollen and tender, full ROM           Assessment & Plan:  For the B12 deficiency we will check a level today. For the arthralgias, try Meloxicam. For stool screening she will be proveded some Hemocult cards to take home.   Alysia Penna, MD

## 2018-06-22 ENCOUNTER — Encounter: Payer: Self-pay | Admitting: Family Medicine

## 2018-07-04 ENCOUNTER — Other Ambulatory Visit (INDEPENDENT_AMBULATORY_CARE_PROVIDER_SITE_OTHER): Payer: BLUE CROSS/BLUE SHIELD

## 2018-07-04 DIAGNOSIS — D509 Iron deficiency anemia, unspecified: Secondary | ICD-10-CM

## 2018-07-04 LAB — POC HEMOCCULT BLD/STL (HOME/3-CARD/SCREEN)
Card #2 Fecal Occult Blod, POC: NEGATIVE
FECAL OCCULT BLD: NEGATIVE
Fecal Occult Blood, POC: NEGATIVE

## 2018-07-09 ENCOUNTER — Encounter: Payer: Self-pay | Admitting: *Deleted

## 2018-08-08 ENCOUNTER — Other Ambulatory Visit: Payer: Self-pay | Admitting: Family Medicine

## 2018-10-17 ENCOUNTER — Other Ambulatory Visit: Payer: Self-pay | Admitting: Family Medicine

## 2018-10-23 ENCOUNTER — Encounter: Payer: Self-pay | Admitting: Family Medicine

## 2018-10-23 ENCOUNTER — Other Ambulatory Visit: Payer: Self-pay | Admitting: Family Medicine

## 2018-10-23 NOTE — Telephone Encounter (Signed)
Refill called and left on the Vm of the pharmacy

## 2018-10-23 NOTE — Telephone Encounter (Signed)
Dr. Fry please advise. Thanks  

## 2018-10-23 NOTE — Telephone Encounter (Signed)
Call in Temazepam 30 mg qhs, #30 with 5 rf 

## 2018-11-29 ENCOUNTER — Encounter: Payer: Self-pay | Admitting: Family Medicine

## 2018-11-29 NOTE — Telephone Encounter (Signed)
I am not familiar with this Bernita Raisin, but I have learned about another new migraine medication that I am excited to try. It is Nurtec ODT. I have some samples if she wants to try this

## 2018-11-29 NOTE — Telephone Encounter (Signed)
Dr. Fry please advise. Thanks  

## 2018-11-30 NOTE — Telephone Encounter (Signed)
Dr. Fry please advise. Thanks  

## 2018-11-30 NOTE — Telephone Encounter (Signed)
The sample pack has 2 pills in it. Or I could send in a prescription for it

## 2018-12-03 NOTE — Telephone Encounter (Signed)
Dr. Fry please advise. Thanks  

## 2018-12-05 ENCOUNTER — Encounter: Payer: Self-pay | Admitting: Family Medicine

## 2018-12-10 NOTE — Telephone Encounter (Signed)
Call in Nurtec ODT 75 mg to take as needed for migraines, #30 with 2 rf

## 2018-12-11 MED ORDER — RIMEGEPANT SULFATE 75 MG PO TBDP: 75.0000 mg | ORAL_TABLET | Freq: Every day | ORAL | 7 refills | Status: DC | PRN

## 2018-12-12 ENCOUNTER — Telehealth: Payer: Self-pay | Admitting: Family Medicine

## 2018-12-12 NOTE — Telephone Encounter (Signed)
Copied from CRM [id number]. Topic: Quick Communication - Rx Refill/Question >> Dec 12, 2018  9:07 AM Maia Petties wrote: Medication: Rimegepant Sulfate (NURTEC) 75 MG TBDP - medication requiring PA RX# [phone]  Has the patient contacted their pharmacy? yes Preferred Pharmacy (with phone number or street name): [id number] - [place],  - [address] AT [facility] [address] [phone] (Phone) [phone] (Fax)

## 2018-12-12 NOTE — Telephone Encounter (Signed)
PA has been sen tot cover my meds.  Hoy Register (Key: [id number])

## 2018-12-14 ENCOUNTER — Encounter: Payer: Self-pay | Admitting: Family Medicine

## 2018-12-17 ENCOUNTER — Encounter: Payer: Self-pay | Admitting: Family Medicine

## 2018-12-17 ENCOUNTER — Encounter: Payer: Self-pay | Admitting: *Deleted

## 2018-12-17 NOTE — Telephone Encounter (Signed)
PA has been denied. Denial letter states " the request does not meet the definition of medical necessity found in the members benefit booklet.

## 2018-12-17 NOTE — Telephone Encounter (Signed)
Dr. Fry please advise. Thanks  

## 2018-12-17 NOTE — Telephone Encounter (Signed)
Please tell her it is not covered

## 2018-12-18 NOTE — Telephone Encounter (Signed)
Call in Cipro 500 mg bid for 7 days  

## 2018-12-19 MED ORDER — CIPROFLOXACIN HCL 500 MG PO TABS: 500.0000 mg | ORAL_TABLET | Freq: Two times a day (BID) | ORAL | 0 refills | Status: DC

## 2018-12-25 NOTE — Telephone Encounter (Signed)
Teddy, from covermymeds, called requesting to know if fax had been received for the appeal for medication. Please advise.   Callback # [phone]

## 2019-01-03 ENCOUNTER — Encounter: Payer: Self-pay | Admitting: Family Medicine

## 2019-01-04 MED ORDER — NITROFURANTOIN MONOHYD MACRO 100 MG PO CAPS: 100.0000 mg | ORAL_CAPSULE | Freq: Two times a day (BID) | ORAL | 0 refills | Status: DC

## 2019-01-04 NOTE — Telephone Encounter (Signed)
Dr. Fry please advise. Thanks  

## 2019-01-04 NOTE — Telephone Encounter (Signed)
I understand. Stop the Cipro. Instead call in Macrobid 100 mg bid for 7 days

## 2019-01-09 NOTE — Telephone Encounter (Signed)
E-appeal has been submitted.  (Key: [id number])

## 2019-01-11 NOTE — Telephone Encounter (Signed)
I understand. She simply is not allowed to take it. Follow up prn

## 2019-01-11 NOTE — Telephone Encounter (Signed)
Appeal has been denied. Denial letter states. This medication does not meet the definition of medical necessity in the members benefit booklet.

## 2019-01-16 ENCOUNTER — Telehealth: Payer: Self-pay | Admitting: Family Medicine

## 2019-01-17 MED ORDER — BIMATOPROST 0.03 % EX SOLN: 1.0000 "application " | Freq: Every day | CUTANEOUS | 0 refills | Status: DC

## 2019-01-18 NOTE — Telephone Encounter (Signed)
Caller name: [id number] - [place], [place] - [address] [phone] (Phone) [phone] (Fax)     Reason for call:  Requesting clarity regarding bimatoprost (LATISSE) 0.03 % ophthalmic solution was it prescribed for eyes lashes or glaucoma, please advise

## 2019-01-21 NOTE — Telephone Encounter (Signed)
Dr. Fry please advise. Thanks  

## 2019-01-22 NOTE — Telephone Encounter (Signed)
This was to treat eyelashes

## 2019-02-28 ENCOUNTER — Encounter: Payer: Self-pay | Admitting: Family Medicine

## 2019-03-01 MED ORDER — AMOXICILL-CLARITHRO-LANSOPRAZ PO MISC: Freq: Two times a day (BID) | ORAL | 0 refills | Status: DC

## 2019-03-01 NOTE — Telephone Encounter (Signed)
I sent this in

## 2019-03-09 ENCOUNTER — Encounter: Payer: Self-pay | Admitting: Family Medicine

## 2019-03-12 ENCOUNTER — Telehealth: Payer: Self-pay

## 2019-03-12 NOTE — Telephone Encounter (Signed)
PA for amoxicillin-clarithromycin-lansoprazole [facility]) combo pack has been sent to cover my meds.  Key: [id number] - Rx #: [id number]

## 2019-03-14 NOTE — Telephone Encounter (Signed)
Mariann Laster with [employer] [place] is calling to state that the amoxicillin-clarithromycin-lansoprazole (Prevpac) has been denied.  CB- [phone] Option 3, Option 1.  Denial notice was faxed as well.

## 2019-03-15 MED ORDER — LANSOPRAZOLE 30 MG PO CPDR: 30.0000 mg | DELAYED_RELEASE_CAPSULE | Freq: Every day | ORAL | 0 refills | Status: DC

## 2019-03-15 MED ORDER — AMOXICILLIN 500 MG PO CAPS: 500.0000 mg | ORAL_CAPSULE | Freq: Two times a day (BID) | ORAL | 0 refills | Status: DC

## 2019-03-15 MED ORDER — CLARITHROMYCIN 500 MG PO TABS: 500.0000 mg | ORAL_TABLET | Freq: Two times a day (BID) | ORAL | 0 refills | Status: DC

## 2019-03-15 NOTE — Telephone Encounter (Signed)
Spoke with the patient over the phone. See phone note from 03/12/2019.

## 2019-03-15 NOTE — Telephone Encounter (Signed)
Denial letter has been placed in Dr. Barbie Banner green folder

## 2019-03-15 NOTE — Telephone Encounter (Signed)
Prescriptions have been sent in. Patient is aware.  

## 2019-03-15 NOTE — Telephone Encounter (Signed)
We will need to send in 3 separate scripts, so call in Lansoprazole 30 mg BID #28, Clarithromycin 500 mg BID #28, and Amoxicillin 500 mg BID #28

## 2019-04-08 ENCOUNTER — Other Ambulatory Visit: Payer: Self-pay | Admitting: Family Medicine

## 2019-04-09 NOTE — Telephone Encounter (Signed)
Patient need to schedule an ov for more refills. 

## 2019-04-11 ENCOUNTER — Other Ambulatory Visit: Payer: Self-pay | Admitting: Family Medicine

## 2019-04-12 ENCOUNTER — Encounter: Payer: Self-pay | Admitting: Family Medicine

## 2019-04-15 MED ORDER — TEMAZEPAM 30 MG PO CAPS: ORAL_CAPSULE | ORAL | 5 refills | Status: DC

## 2019-04-15 NOTE — Telephone Encounter (Signed)
I sent in the refills (they were due in another week)

## 2019-10-01 ENCOUNTER — Encounter: Payer: Self-pay | Admitting: Family Medicine

## 2019-10-02 MED ORDER — TEMAZEPAM 30 MG PO CAPS: ORAL_CAPSULE | ORAL | 5 refills | Status: DC

## 2019-10-02 MED ORDER — BIMATOPROST 0.03 % EX SOLN: 1.0000 "application " | Freq: Every day | CUTANEOUS | 11 refills | Status: DC

## 2019-10-02 NOTE — Telephone Encounter (Signed)
Done

## 2019-10-05 ENCOUNTER — Other Ambulatory Visit: Payer: Self-pay | Admitting: Family Medicine

## 2019-11-12 ENCOUNTER — Other Ambulatory Visit: Payer: Self-pay | Admitting: Family Medicine

## 2020-11-04 ENCOUNTER — Encounter: Payer: Self-pay | Admitting: Family Medicine

## 2020-11-04 ENCOUNTER — Other Ambulatory Visit: Payer: Self-pay | Admitting: Family Medicine

## 2021-05-19 DIAGNOSIS — K582 Mixed irritable bowel syndrome: Secondary | ICD-10-CM | POA: Insufficient documentation

## 2021-05-19 DIAGNOSIS — Z8659 Personal history of other mental and behavioral disorders: Secondary | ICD-10-CM | POA: Insufficient documentation

## 2021-05-19 DIAGNOSIS — Z9189 Other specified personal risk factors, not elsewhere classified: Secondary | ICD-10-CM | POA: Insufficient documentation

## 2021-05-19 DIAGNOSIS — R636 Underweight: Secondary | ICD-10-CM | POA: Insufficient documentation

## 2021-11-04 ENCOUNTER — Emergency Department (INDEPENDENT_AMBULATORY_CARE_PROVIDER_SITE_OTHER): Payer: 59

## 2021-11-04 ENCOUNTER — Emergency Department
Admission: EM | Admit: 2021-11-04 | Discharge: 2021-11-04 | Disposition: A | Discharge status: Home / Self Care | Payer: 59 | Source: Home / Self Care | Attending: Family Medicine | Admitting: Family Medicine

## 2021-11-04 DIAGNOSIS — M25552 Pain in left hip: Secondary | ICD-10-CM | POA: Diagnosis not present

## 2021-11-04 DIAGNOSIS — Z8739 Personal history of other diseases of the musculoskeletal system and connective tissue: Secondary | ICD-10-CM

## 2021-11-04 DIAGNOSIS — R079 Chest pain, unspecified: Secondary | ICD-10-CM | POA: Diagnosis not present

## 2021-11-04 DIAGNOSIS — M25551 Pain in right hip: Secondary | ICD-10-CM

## 2021-11-04 DIAGNOSIS — R200 Anesthesia of skin: Secondary | ICD-10-CM | POA: Diagnosis not present

## 2021-11-04 DIAGNOSIS — M7918 Myalgia, other site: Secondary | ICD-10-CM | POA: Diagnosis not present

## 2021-11-04 DIAGNOSIS — S32501A Unspecified fracture of right pubis, initial encounter for closed fracture: Secondary | ICD-10-CM | POA: Diagnosis not present

## 2021-11-04 DIAGNOSIS — R636 Underweight: Secondary | ICD-10-CM

## 2021-11-04 MED ORDER — PREDNISONE 20 MG PO TABS: 20.0000 mg | ORAL_TABLET | Freq: Every day | ORAL | 0 refills | Status: DC

## 2021-11-04 MED ORDER — HYDROCODONE-ACETAMINOPHEN 10-325 MG PO TABS: 1.0000 | ORAL_TABLET | Freq: Four times a day (QID) | ORAL | 0 refills | Status: DC | PRN

## 2021-11-04 NOTE — ED Triage Notes (Addendum)
Pt c/o LT scapular pain since Sunday after she push mowed for 2 hours. Also having LT sided chest pain, feels muscular but shes not sure. Says she also experiences tingling in her upper LT leg and abd. Hx of fx of LT pubis in Nov, no follow ups with ortho since. ?

## 2021-11-04 NOTE — Discharge Instructions (Addendum)
Take prednisone 1 pill a day.  This is an anti-inflammatory to reduce pain and numbness in your legs ?Take hydrocodone as needed for severe pain ?See Dr. Clent Ridges on Monday ?

## 2021-11-04 NOTE — ED Provider Notes (Signed)
?[facility] ? ? ? ?CSN: [id number] ?Arrival date & time: 11/04/21  1039 ? ? ?  ? ?History   ?Chief Complaint ?Chief Complaint  ?Patient presents with  ? Shoulder Pain  ?  LT scapular  ? Chest Pain  ?  RIB  ? ? ?HPI ?Emma Palmer is a 56 y.o. female.  ? ?HPI ? ?Patient's chart is reviewed.  She has a number of medical problems.  Most notably she has a longstanding history of anorexia and is clearly underweight and malnourished.  Patient states that she is 5 feet tall and weighs 67 pounds at her most recent measurement. ?Patient has osteoporosis.  She had a fall in November 2022.  She fractured her pelvis through the pubic ramus.  She never got additional care after that. ?She states that she has been taking expired hydrocodone that she had in her cabinet for a couple of years.  She states she breaks the pills and pieces so that she can make them last. ?She has longstanding insomnia and takes temazepam daily ?Patient is here because she mowed her lawn and did a lot of yard work over the weekend.  She has developed some pain in her anterior chest and left shoulder.  She called her doctor to be seen and was told that she should consider going to the emergency room if she is having chest pain.  The chest pain is present with palpation of the chest wall.  No exertional pain, radiation of pain, shortness of breath dizziness.  No history of heart disease. ?She also complains of pain in her pelvis.  She intermittently states she has numbness in the right and the left legs. ?She does have an appointment with her primary care doctor on Monday the 24th ? ?Past Medical History:  ?Diagnosis Date  ? Anxiety   ? B12 deficiency   ? Bulimia   ? Depression   ? GERD (gastroesophageal reflux disease)   ? Glaucoma   ? H. pylori infection   ? + serolgies  ? Headache(784.0)   ? Hip dysplasia   ? left - dr supple  ? Hyperlipidemia   ? Intraocular pressure increase   ? dr Hazle Quantdigby  ? Iron deficiency anemia   ? Nephrolithiasis   ?  hx, dr Aldean Astkimbrough  ? OP (osteoporosis) 10-24-06  ? dexa   ? ? ?Patient Active Problem List  ? Diagnosis Date Noted  ? Migraines 04/12/2018  ? Severe episode of recurrent major depressive disorder, without psychotic features (HCC) 10/03/2017  ? Gastric irritation 10/03/2017  ? Primary insomnia 10/03/2017  ? History of Helicobacter pylori infection 10/03/2017  ? Arthralgia 01/06/2017  ? Iron deficiency anemia 06/07/2016  ? OAB (overactive bladder) 06/20/2014  ? GERD (gastroesophageal reflux disease) 04/15/2014  ? Vitamin B12 deficiency 02/10/2014  ? Bulimia nervosa 02/10/2014  ? ANXIETY STATE, UNSPECIFIED 04/09/2010  ? SOLITARY BONE CYST 08/31/2007  ? Hyperlipidemia 11/15/2006  ? DEPRESSION 11/15/2006  ? ARTHRITIS, RIGHT HIP 11/15/2006  ? OSTEOPOROSIS 11/15/2006  ? ? ?Past Surgical History:  ?Procedure Laterality Date  ? ESOPHAGOGASTRODUODENOSCOPY  05-06-05  ? dr Leone Payorgessner -normal including duodenal bxs  ? LITHOTRIPSY    ? stent inserted  ? ? ?OB History   ?No obstetric history on file. ?  ? ? ? ?Home Medications   ? ?Prior to Admission medications   ?Medication Sig Start Date End Date Taking? Authorizing Provider  ?HYDROcodone-acetaminophen (NORCO) 10-325 MG tablet Take 1 tablet by mouth every 6 (six) hours as  needed. 11/04/21  Yes Eustace Moore, MD  ?predniSONE (DELTASONE) 20 MG tablet Take 1 tablet (20 mg total) by mouth daily with breakfast. 11/04/21  Yes Eustace Moore, MD  ?bimatoprost (LATISSE) 0.03 % ophthalmic solution Place 1 application into both eyes at bedtime. Place one drop on applicator and apply evenly along the skin of the upper eyelid at base of eyelashes once daily at bedtime; repeat procedure for second eye (use a clean applicator). 10/02/19   Nelwyn Salisbury, MD  ?temazepam (RESTORIL) 30 MG capsule TAKE 1 CAPSULE BY MOUTH EVERY DAY AT BEDTIME 10/08/19   Nelwyn Salisbury, MD  ? ? ?Family History ?Family History  ?Problem Relation Age of Onset  ? Hyperlipidemia Mother   ? Stroke Mother   ? Blindness  Mother   ? Heart attack Mother   ? Depression Mother   ? Heart attack Father 66  ? Hypertension Father   ? Depression Father   ? Hyperlipidemia Father   ? Heart disease Maternal Grandmother   ? Stroke Maternal Grandmother   ? Stroke Maternal Grandfather   ? Heart disease Paternal Grandmother   ? Colon cancer Neg Hx   ? ? ?Social History ?Social History  ? ?Tobacco Use  ? Smoking status: Former  ?  Packs/day: 0.50  ?  Types: Cigarettes  ?  Quit date: 2018  ?  Years since quitting: 5.3  ? Smokeless tobacco: Never  ? Tobacco comments:  ?  1/2 pack or less  ?Vaping Use  ? Vaping Use: Never used  ?Substance Use Topics  ? Alcohol use: No  ?  Alcohol/week: 0.0 standard drinks  ? Drug use: No  ? ? ? ?Allergies   ?Other and Restasis [cyclosporine] ? ? ?Review of Systems ?Review of Systems ?See HPI ? ?Physical Exam ?Triage Vital Signs ?ED Triage Vitals [11/04/21 1102]  ?Enc Vitals Group  ?   BP 123/87  ?   Pulse Rate 66  ?   Resp 18  ?   Temp 98.2 ?F (36.8 ?C)  ?   Temp Source Oral  ?   SpO2 100 %  ?   Weight   ?   Height   ?   Head Circumference   ?   Peak Flow   ?   Pain Score 2  ?   Pain Loc   ?   Pain Edu?   ?   Excl. in GC?   ? ?No data found. ? ?Updated Vital Signs ?BP 123/87 (BP Location: Right Arm)   Pulse 66   Temp 98.2 ?F (36.8 ?C) (Oral)   Resp 18   LMP 10/27/2010   SpO2 100%  ? ?Physical Exam ?Constitutional:   ?   General: She is not in acute distress. ?   Appearance: She is well-developed.  ?   Comments: Patient is pleasant.  In no acute distress.  Slightly flexed posture.  Anxious about health.  Appears older than stated age  ?HENT:  ?   Head: Normocephalic and atraumatic.  ?   Right Ear: Tympanic membrane and ear canal normal.  ?   Left Ear: Tympanic membrane normal.  ?   Nose: Nose normal.  ?   Mouth/Throat:  ?   Pharynx: Posterior oropharyngeal erythema present.  ?Eyes:  ?   Conjunctiva/sclera: Conjunctivae normal.  ?   Pupils: Pupils are equal, round, and reactive to light.  ?Cardiovascular:  ?   Rate  and Rhythm: Normal rate and regular rhythm.  ?  Heart sounds: Normal heart sounds.  ?Pulmonary:  ?   Effort: Pulmonary effort is normal. No respiratory distress.  ?   Breath sounds: Normal breath sounds.  ?   Comments: Mild tenderness on the anterior chest wall under the left scapula ?Chest:  ?   Chest wall: Tenderness present.  ?Abdominal:  ?   General: There is no distension.  ?   Palpations: Abdomen is soft.  ?Musculoskeletal:     ?   General: No swelling or tenderness. Normal range of motion.  ?   Cervical back: Normal range of motion. No tenderness.  ?Skin: ?   General: Skin is warm and dry.  ?Neurological:  ?   Mental Status: She is alert.  ?   Gait: Gait normal.  ?   Comments: No limp, small steps  ? ? ? ?UC Treatments / Results  ?Labs ?(all labs ordered are listed, but only abnormal results are displayed) ?Labs Reviewed - No data to display ? ?EKG ? ? ?Radiology ?DG Hips Bilat W or Wo Pelvis 2 Views ? ?Result Date: 11/04/2021 ?CLINICAL DATA:  Old pubic ramus fracture. EXAM: DG HIP (WITH OR WITHOUT PELVIS) 2V BILAT COMPARISON:  March 27, 2011. FINDINGS: No acute fracture or dislocation is noted. Old healed fractures are seen involving the left superior pubic ramus as well as the right inferior pubic ramus. Sacroiliac and hip joints are unremarkable. Chronic deformity of left femoral head is noted which is stable compared to prior exam. IMPRESSION: Old bilateral pubic rami fractures are noted. No acute abnormality is noted. Electronically Signed   By: Lupita Raider M.D.   On: 11/04/2021 12:02   ? ?Procedures ?ED EKG ? ?Date/Time: 11/04/2021 11:56 AM ?Performed by: Eustace Moore, MD ?Authorized by: Eustace Moore, MD  ? ?ECG reviewed by ED Physician in the absence of a cardiologist: yes   ?Previous ECG:  ?  Previous ECG:  Compared to current ?  Similarity:  No change ?Interpretation:  ?  Interpretation: abnormal   ?  Details:  Right axis deviation with questionable right ventricular hypertrophy.   Unchanged from prior ?Rate:  ?  ECG rate:  68 ?  ECG rate assessment: normal   ?Rhythm:  ?  Rhythm: sinus rhythm   ?Ectopy:  ?  Ectopy: none   ?QRS:  ?  QRS axis:  Normal ?ST segments:  ?  ST segments:  Normal

## 2021-11-08 ENCOUNTER — Ambulatory Visit: Payer: BLUE CROSS/BLUE SHIELD | Admitting: Family Medicine

## 2021-11-15 ENCOUNTER — Telehealth: Payer: Self-pay

## 2021-11-15 NOTE — Telephone Encounter (Signed)
Called patient to follow up on patient complaint that was sent in to Quality Grievance team. Patient is aware that Dr. Sarajane Jews will take her back as a new patient. Patient stated she currently has a new patient appointment set up in [place].  ?

## 2021-12-09 ENCOUNTER — Emergency Department
Admission: EM | Admit: 2021-12-09 | Discharge: 2021-12-09 | Disposition: A | Discharge status: Home / Self Care | Payer: 59 | Source: Home / Self Care

## 2021-12-09 DIAGNOSIS — K121 Other forms of stomatitis: Secondary | ICD-10-CM

## 2021-12-09 HISTORY — DX: Personal history of (healed) traumatic fracture: Z87.81

## 2021-12-09 MED ORDER — AMOXICILLIN 500 MG PO CAPS: 500.0000 mg | ORAL_CAPSULE | Freq: Two times a day (BID) | ORAL | 0 refills | Status: DC

## 2021-12-09 NOTE — Discharge Instructions (Addendum)
Instructed patient to take medication as directed with food to completion.  Encouraged patient increase daily water intake while taking this medication.  Advised patient if symptoms worsen and/or unresolved please follow-up with PCP or here for further evaluation. 

## 2021-12-09 NOTE — ED Provider Notes (Signed)
[facility]    CSN: [id number] Arrival date & time: 12/09/21  0857      History   Chief Complaint Chief Complaint  Patient presents with   Mouth Lesions    HPI Emma Palmer is a 56 y.o. female.   HPI 56 year old female presents with mouth ulceration caused by eating [place] yesterday.  Patient reports that having mouth blister on hard palate has happened to her before.  PMH significant for bulimia, HLD and iron deficiency anemia.  Past Medical History:  Diagnosis Date   Anxiety    B12 deficiency    Bulimia    Depression    GERD (gastroesophageal reflux disease)    Glaucoma    H. pylori infection    + serolgies   Headache(784.0)    Hip dysplasia    left - dr supple   History of pelvic fracture    Hyperlipidemia    Intraocular pressure increase    dr Hazle Quantdigby   Iron deficiency anemia    Nephrolithiasis    hx, dr Aldean Astkimbrough   OP (osteoporosis) 10/24/2006   dexa     Patient Active Problem List   Diagnosis Date Noted   Migraines 04/12/2018   Severe episode of recurrent major depressive disorder, without psychotic features (HCC) 10/03/2017   Gastric irritation 10/03/2017   Primary insomnia 10/03/2017   History of Helicobacter pylori infection 10/03/2017   Arthralgia 01/06/2017   Iron deficiency anemia 06/07/2016   OAB (overactive bladder) 06/20/2014   GERD (gastroesophageal reflux disease) 04/15/2014   Vitamin B12 deficiency 02/10/2014   Bulimia nervosa 02/10/2014   ANXIETY STATE, UNSPECIFIED 04/09/2010   SOLITARY BONE CYST 08/31/2007   Hyperlipidemia 11/15/2006   DEPRESSION 11/15/2006   ARTHRITIS, RIGHT HIP 11/15/2006   OSTEOPOROSIS 11/15/2006    Past Surgical History:  Procedure Laterality Date   ESOPHAGOGASTRODUODENOSCOPY  05-06-05   dr Leone Payorgessner -normal including duodenal bxs   LITHOTRIPSY     stent inserted    OB History   No obstetric history on file.      Home Medications    Prior to Admission medications   Medication Sig  Start Date End Date Taking? Authorizing Provider  amoxicillin (AMOXIL) 500 MG capsule Take 1 capsule (500 mg total) by mouth 2 (two) times daily for 7 days. 12/09/21 12/16/21 Yes Emma Palmer, Emma Scripter, FNP  temazepam (RESTORIL) 30 MG capsule Take by mouth. 06/03/21  Yes [provider]  bimatoprost (LATISSE) 0.03 % ophthalmic solution Place 1 application into both eyes at bedtime. Place one drop on applicator and apply evenly along the skin of the upper eyelid at base of eyelashes once daily at bedtime; repeat procedure for second eye (use a clean applicator). 10/02/19   Nelwyn SalisburyFry, Stephen A, MD  HYDROcodone-acetaminophen (NORCO) 10-325 MG tablet Take 1 tablet by mouth every 6 (six) hours as needed. 11/04/21   Eustace MooreNelson, Yvonne Sue, MD  predniSONE (DELTASONE) 20 MG tablet Take 1 tablet (20 mg total) by mouth daily with breakfast. 11/04/21   Eustace MooreNelson, Yvonne Sue, MD  temazepam (RESTORIL) 30 MG capsule TAKE 1 CAPSULE BY MOUTH EVERY DAY AT BEDTIME 10/08/19   Nelwyn SalisburyFry, Stephen A, MD    Family History Family History  Problem Relation Age of Onset   Hyperlipidemia Mother    Stroke Mother    Blindness Mother    Heart attack Mother    Depression Mother    Heart attack Father [id number]   Hypertension Father    Depression Father    Hyperlipidemia Father  Heart disease Maternal Grandmother    Stroke Maternal Grandmother    Stroke Maternal Grandfather    Heart disease Paternal Grandmother    Colon cancer Neg Hx     Social History Social History   Tobacco Use   Smoking status: Former    Packs/day: 0.50    Types: Cigarettes    Quit date: 2018    Years since quitting: 5.3   Smokeless tobacco: Never   Tobacco comments:    1/2 pack or less  Vaping Use   Vaping Use: Never used  Substance Use Topics   Alcohol use: No    Alcohol/week: 0.0 standard drinks   Drug use: No     Allergies   Other and Restasis [cyclosporine]   Review of Systems Review of Systems  HENT:         Mouth ulceration x1 day     Physical Exam Triage Vital Signs ED Triage Vitals  Enc Vitals Group     BP 12/09/21 0920 105/68     Pulse Rate 12/09/21 0920 72     Resp 12/09/21 0920 20     Temp 12/09/21 0920 97.9 F (36.6 C)     Temp Source 12/09/21 0920 Oral     SpO2 12/09/21 0920 100 %     Weight 12/09/21 0915 75 lb (34 kg)     Height 12/09/21 0915 5' (1.524 m)     Head Circumference --      Peak Flow --      Pain Score 12/09/21 0915 8     Pain Loc --      Pain Edu? --      Excl. in GC? --    No data found.  Updated Vital Signs BP 105/68 (BP Location: Right Arm)   Pulse 72   Temp 97.9 F (36.6 C) (Oral)   Resp 20   Ht 5' (1.524 m)   Wt 75 lb (34 kg)   LMP 10/27/2010   SpO2 100%   BMI 14.65 kg/m       Physical Exam Vitals and nursing note reviewed.  Constitutional:      Appearance: She is ill-appearing.     Comments: Cachectic  HENT:     Head: Normocephalic and atraumatic.     Mouth/Throat:     Mouth: Mucous membranes are moist.     Pharynx: Oropharynx is clear.     Comments: Hard palate: Erythematous, with scattered ecchymosis noted Eyes:     Extraocular Movements: Extraocular movements intact.     Conjunctiva/sclera: Conjunctivae normal.     Pupils: Pupils are equal, round, and reactive to light.  Cardiovascular:     Rate and Rhythm: Normal rate and regular rhythm.     Pulses: Normal pulses.     Heart sounds: Normal heart sounds.  Pulmonary:     Effort: Pulmonary effort is normal.     Breath sounds: Normal breath sounds. No wheezing, rhonchi or rales.  Musculoskeletal:     Cervical back: Normal range of motion and neck supple.  Skin:    General: Skin is warm and dry.  Neurological:     General: No focal deficit present.     Mental Status: She is alert and oriented to person, place, and time. Mental status is at baseline.     UC Treatments / Results  Labs (all labs ordered are listed, but only abnormal results are displayed) Labs Reviewed - No data to  display  EKG   Radiology No results  found.  Procedures Procedures (including critical care time)  Medications Ordered in UC Medications - No data to display  Initial Impression / Assessment and Plan / UC Course  I have reviewed the triage vital signs and the nursing notes.  Pertinent labs & imaging results that were available during my care of the patient were reviewed by me and considered in my medical decision making (see chart for details).     MDM: 1.  Oral ulceration-Rx'd Amoxicillin. Instructed patient to take medication as directed with food to completion.  Encouraged patient increase daily water intake while taking this medication.  Advised patient if symptoms worsen and/or unresolved please follow-up with PCP or here for further evaluation.  Patient discharged home, hemodynamically stable. Final Clinical Impressions(s) / UC Diagnoses   Final diagnoses:  Oral ulceration     Discharge Instructions      Instructed patient to take medication as directed with food to completion.  Encouraged patient increase daily water intake while taking this medication.  Advised patient if symptoms worsen and/or unresolved please follow-up with PCP or here for further evaluation.     ED Prescriptions     Medication Sig Dispense Auth. Provider   amoxicillin (AMOXIL) 500 MG capsule Take 1 capsule (500 mg total) by mouth 2 (two) times daily for 7 days. 14 capsule Emma Iha, FNP      I have reviewed the PDMP during this encounter.   Emma Iha, FNP 12/09/21 1057

## 2021-12-09 NOTE — ED Triage Notes (Signed)
Pt presents to Urgent Care with c/o oral pain/lesion after "cutting mouth on a french fry" yesterday. States area became swollen and she popped it, seeing blood. Lesion noted on the roof of pt's mouth.

## 2021-12-15 ENCOUNTER — Telehealth: Payer: Self-pay | Admitting: Family Medicine

## 2021-12-15 ENCOUNTER — Emergency Department: Admission: EM | Admit: 2021-12-15 | Discharge: 2021-12-15 | Discharge status: Against Medical Advice | Payer: Self-pay

## 2021-12-15 MED ORDER — AMOXICILLIN 500 MG PO CAPS: 500.0000 mg | ORAL_CAPSULE | Freq: Two times a day (BID) | ORAL | 0 refills | Status: DC

## 2021-12-15 NOTE — Telephone Encounter (Signed)
Pt here in person to request an extension on antibiotic - completed 7 days of amoxicillin. Mouth sores are improving per pt. Chart reviewed w/ provider, Dr Assunta Found who will send in additional 3 days of antibiotics. No other questions at this time

## 2021-12-15 NOTE — Telephone Encounter (Signed)
Patient reports that her mouth ulcers are improving, and she requests Rx amoxicillin for 3 more days.  Rx amoxicillin 500mg  BID, #6.

## 2021-12-31 ENCOUNTER — Ambulatory Visit (INDEPENDENT_AMBULATORY_CARE_PROVIDER_SITE_OTHER): Payer: 59 | Admitting: Medical-Surgical

## 2021-12-31 ENCOUNTER — Encounter: Payer: Self-pay | Admitting: Medical-Surgical

## 2021-12-31 VITALS — BP 109/74 | HR 67 | Resp 20 | Ht 60.0 in | Wt 72.0 lb

## 2021-12-31 DIAGNOSIS — F5101 Primary insomnia: Secondary | ICD-10-CM | POA: Diagnosis not present

## 2021-12-31 DIAGNOSIS — R636 Underweight: Secondary | ICD-10-CM

## 2021-12-31 DIAGNOSIS — G43911 Migraine, unspecified, intractable, with status migrainosus: Secondary | ICD-10-CM

## 2021-12-31 DIAGNOSIS — Z7689 Persons encountering health services in other specified circumstances: Secondary | ICD-10-CM | POA: Diagnosis not present

## 2021-12-31 DIAGNOSIS — R69 Illness, unspecified: Secondary | ICD-10-CM | POA: Diagnosis not present

## 2021-12-31 MED ORDER — TEMAZEPAM 30 MG PO CAPS: ORAL_CAPSULE | ORAL | 2 refills | Status: DC

## 2021-12-31 MED ORDER — HYDROCODONE-ACETAMINOPHEN 5-325 MG PO TABS: 1.0000 | ORAL_TABLET | Freq: Three times a day (TID) | ORAL | 0 refills | Status: DC | PRN

## 2021-12-31 NOTE — Progress Notes (Signed)
New Patient Office Visit  Subjective:  Patient ID: Emma Palmer, female    DOB: Dec 06, 1965  Age: 56 y.o. MRN: [id number]  CC:  Chief Complaint  Patient presents with   Establish Care     HPI ADA HOLNESS presents to establish care. She was previously a patient of Dr. Gershon Crane but switched to be able to save money. Did not have a good experience but when she tried to switch back to Dr. Clent Ridges, there was a problem with getting back in. She scheduled with our office and decided to proceed with this appointment although they rectified the issue and Dr. Clent Ridges is willing to have her back as a patient. Notes that this office is a lot closer and she can save on gas money.   Migraines- long history of migraines that affects her eye unilaterally but switches sides. Has tried several treatments and her prior PCP settled for treatment with prn Hydrocodone-APAP. HAs usually last for about 3 days.   Insomnia- taking Temazepam 30mg  nightly which is helpful. Has been on this long term and is unable to sleep if she doesn't take it. Has tried going without it for several days and did not sleep at all.   Underweight-has been treated in the past for any disorder but notes her most recent weight loss has occurred due to an oral injury with a blood blister and tooth trauma.  It has been trying to increase her caloric intake and drink Ensure Plus but this is often too strong and she has to dilute it.  She is aware that weight gain is recommended.  Past Medical History:  Diagnosis Date   Anxiety    B12 deficiency    Bulimia    Depression    GERD (gastroesophageal reflux disease)    Glaucoma    H. pylori infection    + serolgies   Headache(784.0)    Hip dysplasia    left - dr supple   History of pelvic fracture    Hyperlipidemia    Intraocular pressure increase    dr   Iron deficiency anemia    Nephrolithiasis    hx, dr Hazle Quant   OP (osteoporosis) 10/24/2006   dexa     Past Surgical  History:  Procedure Laterality Date   ESOPHAGOGASTRODUODENOSCOPY  05-06-05   dr 05-08-05 -normal including duodenal bxs   LITHOTRIPSY     stent inserted    Family History  Problem Relation Age of Onset   Hyperlipidemia Mother    Stroke Mother    Blindness Mother    Heart attack Mother    Depression Mother    Heart attack Father 82   Hypertension Father    Depression Father    Hyperlipidemia Father    Heart disease Maternal Grandmother    Stroke Maternal Grandmother    Stroke Maternal Grandfather    Heart disease Paternal Grandmother    Colon cancer Neg Hx     Social History   Socioeconomic History   Marital status: Single    Spouse name: Not on file   Number of children: 0   Years of education: Not on file   Highest education level: Some college, no degree  Occupational History   Occupation: unemployed    47: UNEMPLOYED  Tobacco Use   Smoking status: Former    Packs/day: 0.50    Types: Cigarettes    Quit date: 2018    Years since quitting: 5.4   Smokeless tobacco: Never  Tobacco comments:    1/2 pack or less  Vaping Use   Vaping Use: Never used  Substance and Sexual Activity   Alcohol use: No    Alcohol/week: 0.0 standard drinks of alcohol   Drug use: No   Sexual activity: Never  Other Topics Concern   Not on file  Social History Narrative   Single lives alone, no children   Unemployed since 2006   No alcohol   3 caffeinated beverages daily   No tobacco no drugs   06/13/2017   Social Determinants of Health   Financial Resource Strain: Not on file  Food Insecurity: Not on file  Transportation Needs: Not on file  Physical Activity: Unknown (10/03/2017)   Exercise Vital Sign    Days of Exercise per Week: 7 days    Minutes of Exercise per Session: Not on file  Stress: Not on file  Social Connections: Not on file  Intimate Partner Violence: Not on file    ROS Review of Systems  Constitutional:  Positive for chills and unexpected weight  change. Negative for fatigue and fever.  HENT:  Positive for mouth sores. Negative for congestion, rhinorrhea, sinus pressure and sore throat.   Eyes:  Positive for visual disturbance.  Respiratory:  Negative for cough, chest tightness and shortness of breath.   Cardiovascular:  Positive for leg swelling. Negative for chest pain and palpitations.  Gastrointestinal:  Positive for abdominal pain. Negative for constipation, diarrhea, nausea and vomiting.  Endocrine: Positive for cold intolerance. Negative for heat intolerance.  Genitourinary:  Negative for dysuria, frequency, urgency, vaginal bleeding and vaginal discharge.  Skin:  Negative for rash and wound.  Neurological:  Positive for headaches. Negative for dizziness and light-headedness.  Hematological:  Bruises/bleeds easily.  Psychiatric/Behavioral:  Positive for sleep disturbance. Negative for self-injury and suicidal ideas. The patient is nervous/anxious.     Objective:   Today's Vitals: BP 109/74 (BP Location: Right Arm, Cuff Size: Small)   Pulse 67   Resp 20   Ht 5' (1.524 m)   Wt 72 lb (32.7 kg)   LMP 10/27/2010   SpO2 99%   BMI 14.06 kg/m   Physical Exam Vitals reviewed.  Constitutional:      General: She is not in acute distress.    Appearance: She is underweight.  HENT:     Head: Normocephalic and atraumatic.  Cardiovascular:     Rate and Rhythm: Normal rate and regular rhythm.     Pulses: Normal pulses.     Heart sounds: Normal heart sounds. No murmur heard.    No friction rub. No gallop.  Pulmonary:     Effort: Pulmonary effort is normal. No respiratory distress.     Breath sounds: Normal breath sounds. No wheezing.  Skin:    General: Skin is warm and dry.  Neurological:     Mental Status: She is alert and oriented to person, place, and time.  Psychiatric:        Mood and Affect: Mood normal.        Behavior: Behavior normal.        Thought Content: Thought content normal.        Judgment: Judgment  normal.    Assessment & Plan:   1. Encounter to establish care Reviewed available information and discussed care concerns with patient.   2. Intractable migraine with status migrainosus, unspecified migraine type Discussed various options for treatment of migraines.  She has been treated long-term with hydrocodone and is using these  extremely sparingly.  Refilling hydrocodone-APAP 5-325 mg, take 1/2 tablet twice daily as needed for migraines with very sparing use.  3. Primary insomnia Long-term treatment with temazepam.  Continue temazepam 30 mg daily at bedtime.  4. Underweight, weight gain advised Oral issues have resolved.  Recommend increasing caloric intake to help with weight gain.  Outpatient Encounter Medications as of 12/31/2021  Medication Sig   B Complex-C (B-COMPLEX WITH VITAMIN C) tablet Take 1 tablet by mouth daily.   bimatoprost (LATISSE) 0.03 % ophthalmic solution Place 1 application into both eyes at bedtime. Place one drop on applicator and apply evenly along the skin of the upper eyelid at base of eyelashes once daily at bedtime; repeat procedure for second eye (use a clean applicator).   Cyanocobalamin 2500 MCG SUBL Place under the tongue.   Ensure Plus (ENSURE PLUS) LIQD Take 237 mLs by mouth daily.   Folic Acid (FOLATE PO) Take 800 mcg by mouth 1 day or 1 dose.   HYDROcodone-acetaminophen (NORCO/VICODIN) 5-325 MG tablet Take 1 tablet by mouth every 8 (eight) hours as needed for moderate pain.   [DISCONTINUED] HYDROcodone-acetaminophen (NORCO) 10-325 MG tablet Take 1 tablet by mouth every 6 (six) hours as needed.   [DISCONTINUED] temazepam (RESTORIL) 30 MG capsule TAKE 1 CAPSULE BY MOUTH EVERY DAY AT BEDTIME   temazepam (RESTORIL) 30 MG capsule TAKE 1 CAPSULE BY MOUTH EVERY DAY AT BEDTIME   [DISCONTINUED] amoxicillin (AMOXIL) 500 MG capsule Take 1 capsule (500 mg total) by mouth 2 (two) times daily.   [DISCONTINUED] predniSONE (DELTASONE) 20 MG tablet Take 1 tablet  (20 mg total) by mouth daily with breakfast.   [DISCONTINUED] temazepam (RESTORIL) 30 MG capsule Take by mouth.   No facility-administered encounter medications on file as of 12/31/2021.    Follow-up: Return in about 3 months (around 04/02/2022) for insomnia/migraine follow up.   Thayer Ohm, DNP, APRN, FNP-BC [facility] and Sports Medicine

## 2022-01-23 ENCOUNTER — Encounter: Payer: Self-pay | Admitting: Medical-Surgical

## 2022-01-25 ENCOUNTER — Telehealth: Payer: Self-pay

## 2022-01-25 ENCOUNTER — Other Ambulatory Visit: Payer: Self-pay | Admitting: Medical-Surgical

## 2022-01-25 DIAGNOSIS — G43911 Migraine, unspecified, intractable, with status migrainosus: Secondary | ICD-10-CM

## 2022-01-25 NOTE — Telephone Encounter (Addendum)
Initiated Prior authorization WCB:[id number] 30MG  capsules Via: Covermymeds Case/Key:[id number] Status: denied as of 01/25/22 Reason:We based this coverage denial on the terms of your benefit plan. Your plan does not cover services that are not medically necessary or that are contractually excluded. For a full explanation of the coverage available, please review your Certificate of Coverage. The Pharmacy Benefit Exclusions section provides details Notified Pt via: Mychart

## 2022-02-13 ENCOUNTER — Encounter: Payer: Self-pay | Admitting: Medical-Surgical

## 2022-02-21 ENCOUNTER — Ambulatory Visit
Admission: EM | Admit: 2022-02-21 | Discharge: 2022-02-21 | Disposition: A | Discharge status: Home / Self Care | Payer: 59 | Attending: Family Medicine | Admitting: Family Medicine

## 2022-02-21 DIAGNOSIS — R519 Headache, unspecified: Secondary | ICD-10-CM

## 2022-02-21 MED ORDER — KETOROLAC TROMETHAMINE 30 MG/ML IJ SOLN
30.0000 mg | Freq: Once | INTRAMUSCULAR | Status: AC
  Administered 2022-02-21: 30 mg via INTRAMUSCULAR

## 2022-02-21 MED ORDER — METOCLOPRAMIDE HCL 5 MG/ML IJ SOLN
10.0000 mg | INTRAMUSCULAR | Status: AC
  Administered 2022-02-21: 10 mg via INTRAMUSCULAR

## 2022-02-21 MED ORDER — BUTALBITAL-APAP-CAFFEINE 50-325-40 MG PO TABS: 1.0000 | ORAL_TABLET | Freq: Every day | ORAL | 0 refills | Status: DC | PRN

## 2022-02-21 NOTE — ED Provider Notes (Signed)
[name]    CSN: [id number] Arrival date & time: 02/21/22  0827      History   Chief Complaint Chief Complaint  Patient presents with   Headache    HPI Emma Palmer is a 56 y.o. female.   HPI Pleasant 56 year old female presents with headache for 3 days reports headache pain is unbearable.  Patient reports has recently re-established primary care with [facility] provider.  PMH significant for migraine, bulimia, primary insomnia, and glaucoma.  Patient reports headache is dull pounding on the very center top of her head and left temporal area currently reporting 9 of 10 headache pain  Past Medical History:  Diagnosis Date   Anxiety    B12 deficiency    Bulimia    Depression    GERD (gastroesophageal reflux disease)    Glaucoma    H. pylori infection    + serolgies   Headache(784.0)    Hip dysplasia    left - dr supple   History of pelvic fracture    Hyperlipidemia    Intraocular pressure increase    dr Hazle Quant   Iron deficiency anemia    Nephrolithiasis    hx, dr Aldean Ast   OP (osteoporosis) 10/24/2006   dexa     Patient Active Problem List   Diagnosis Date Noted   Migraines 04/12/2018   Severe episode of recurrent major depressive disorder, without psychotic features (HCC) 10/03/2017   Gastric irritation 10/03/2017   Primary insomnia 10/03/2017   History of Helicobacter pylori infection 10/03/2017   Arthralgia 01/06/2017   Iron deficiency anemia 06/07/2016   OAB (overactive bladder) 06/20/2014   GERD (gastroesophageal reflux disease) 04/15/2014   Vitamin B12 deficiency 02/10/2014   Bulimia nervosa 02/10/2014   ANXIETY STATE, UNSPECIFIED 04/09/2010   SOLITARY BONE CYST 08/31/2007   Hyperlipidemia 11/15/2006   DEPRESSION 11/15/2006   ARTHRITIS, RIGHT HIP 11/15/2006   OSTEOPOROSIS 11/15/2006    Past Surgical History:  Procedure Laterality Date   ESOPHAGOGASTRODUODENOSCOPY  05-06-05   dr Leone Payor -normal including duodenal bxs    LITHOTRIPSY     stent inserted    OB History   No obstetric history on file.      Home Medications    Prior to Admission medications   Medication Sig Start Date End Date Taking? Authorizing Provider  butalbital-acetaminophen-caffeine (FIORICET) [phone] MG tablet Take 1 tablet by mouth daily as needed for headache. 02/21/22 02/21/23 Yes Trevor Iha, FNP  B Complex-C (B-COMPLEX WITH VITAMIN C) tablet Take 1 tablet by mouth daily.    [provider]  bimatoprost (LATISSE) 0.03 % ophthalmic solution Place 1 application into both eyes at bedtime. Place one drop on applicator and apply evenly along the skin of the upper eyelid at base of eyelashes once daily at bedtime; repeat procedure for second eye (use a clean applicator). 10/02/19   Nelwyn Salisbury, MD  Cyanocobalamin 2500 MCG SUBL Place under the tongue.    [provider]  Ensure Plus (ENSURE PLUS) LIQD Take 237 mLs by mouth daily.    [provider]  Folic Acid (FOLATE PO) Take 800 mcg by mouth 1 day or 1 dose.    [provider]  temazepam (RESTORIL) 30 MG capsule TAKE 1 CAPSULE BY MOUTH EVERY DAY AT BEDTIME 12/31/21   Christen Butter, NP    Family History Family History  Problem Relation Age of Onset   Hyperlipidemia Mother    Stroke Mother    Blindness Mother    Heart  attack Mother    Depression Mother    Heart attack Father 90   Hypertension Father    Depression Father    Hyperlipidemia Father    Heart disease Maternal Grandmother    Stroke Maternal Grandmother    Stroke Maternal Grandfather    Heart disease Paternal Grandmother    Colon cancer Neg Hx     Social History Social History   Tobacco Use   Smoking status: Former    Packs/day: 0.50    Types: Cigarettes    Quit date: 2018    Years since quitting: 5.6   Smokeless tobacco: Never   Tobacco comments:    1/2 pack or less  Vaping Use   Vaping Use: Never used  Substance Use Topics   Alcohol use: No    Alcohol/week: 0.0  standard drinks of alcohol   Drug use: No     Allergies   Restasis [cyclosporine] and Other   Review of Systems Review of Systems  Neurological:  Positive for headaches.     Physical Exam Triage Vital Signs ED Triage Vitals  Enc Vitals Group     BP 02/21/22 0834 103/69     Pulse Rate 02/21/22 0834 63     Resp 02/21/22 0834 14     Temp 02/21/22 0834 99.4 F (37.4 C)     Temp Source 02/21/22 0834 Oral     SpO2 02/21/22 0834 100 %     Weight 02/21/22 0837 73 lb (33.1 kg)     Height --      Head Circumference --      Peak Flow --      Pain Score 02/21/22 0837 10     Pain Loc --      Pain Edu? --      Excl. in GC? --    No data found.  Updated Vital Signs BP 103/69 (BP Location: Right Arm)   Pulse 63   Temp 99.4 F (37.4 C) (Oral)   Resp 14   Wt 73 lb (33.1 kg)   LMP 10/27/2010   SpO2 100%   BMI 14.26 kg/m    Physical Exam Vitals and nursing note reviewed.  Constitutional:      General: She is not in acute distress.    Appearance: She is well-developed. She is not ill-appearing, toxic-appearing or diaphoretic.     Comments: Very lean/borderline cachectic  HENT:     Head: Normocephalic and atraumatic.     Mouth/Throat:     Mouth: Mucous membranes are moist.  Eyes:     General: No visual field deficit.    Extraocular Movements: Extraocular movements intact.     Right eye: Normal extraocular motion.     Pupils: Pupils are equal, round, and reactive to light.  Cardiovascular:     Rate and Rhythm: Normal rate and regular rhythm.     Heart sounds: Normal heart sounds. No murmur heard. Pulmonary:     Effort: Pulmonary effort is normal.     Breath sounds: Normal breath sounds. No wheezing, rhonchi or rales.  Musculoskeletal:     Cervical back: Normal range of motion and neck supple. No rigidity.  Skin:    General: Skin is warm and dry.  Neurological:     Mental Status: She is alert and oriented to person, place, and time.     Cranial Nerves: No cranial  nerve deficit, dysarthria or facial asymmetry.  Psychiatric:        Mood and Affect: Mood normal.  Behavior: Behavior normal.      UC Treatments / Results  Labs (all labs ordered are listed, but only abnormal results are displayed) Labs Reviewed - No data to display  EKG   Radiology No results found.  Procedures Procedures (including critical care time)  Medications Ordered in UC Medications  metoCLOPramide (REGLAN) injection 10 mg (10 mg Intramuscular Given 02/21/22 0859)  ketorolac (TORADOL) 30 MG/ML injection 30 mg (30 mg Intramuscular Given 02/21/22 0859)    Initial Impression / Assessment and Plan / UC Course  I have reviewed the triage vital signs and the nursing notes.  Pertinent labs & imaging results that were available during my care of the patient were reviewed by me and considered in my medical decision making (see chart for details).     MDM: 1.  Bad headache-IM Toradol 30 mg, IM Reglan 10 mg given once in clinic, Rx'd Fioricet. Advised patient may take medication Fioricet) as needed for migraine/bad headache pain.  Advised patient to take medication with food and encouraged increase in daily water intake while taking this medication.  Advised patient if symptoms worsen and/or unresolved please follow-up with PCP, local ED, or here for further evaluation.  Patient discharged home, hemodynamically stable. Final Clinical Impressions(s) / UC Diagnoses   Final diagnoses:  Bad headache     Discharge Instructions      Advised patient may take medication Fioricet) as needed for migraine/bad headache pain.  Advised patient to take medication with food and encouraged increase in daily water intake while taking this medication.  Advised patient if symptoms worsen and/or unresolved please follow-up with PCP, local ED, or here for further evaluation.     ED Prescriptions     Medication Sig Dispense Auth. Provider   butalbital-acetaminophen-caffeine (FIORICET)  50-325-40 MG tablet Take 1 tablet by mouth daily as needed for headache. 20 tablet Trevor Iha, FNP      I have reviewed the PDMP during this encounter.   Trevor Iha, FNP 02/21/22 [phone]

## 2022-02-21 NOTE — ED Triage Notes (Signed)
Pt presents with c/o HA x 3 days. Pt states the pain is unbearable. Pt denies taking anything for pain. Declines tylenol.

## 2022-02-21 NOTE — Discharge Instructions (Addendum)
Advised patient may take medication Fioricet) as needed for migraine/bad headache pain.  Advised patient to take medication with food and encouraged increase in daily water intake while taking this medication.  Advised patient if symptoms worsen and/or unresolved please follow-up with PCP, local ED, or here for further evaluation.

## 2022-02-22 ENCOUNTER — Telehealth: Payer: Self-pay

## 2022-02-22 ENCOUNTER — Telehealth: Payer: Self-pay | Admitting: Emergency Medicine

## 2022-02-22 NOTE — Telephone Encounter (Signed)
Left VM asking pt how she is feeling after yesterdays UC visit. Advised to call back if any questions or concerns.

## 2022-02-22 NOTE — Telephone Encounter (Signed)
Return call from Mayrene who states H/A pain is better- not completely resolved. Pt stated she had not picked up her Fiorcet yet, but it was ready. RN instructed Arieon to pick up medication from pharmacy and take as directed. No other questions at this time

## 2022-02-28 ENCOUNTER — Encounter: Payer: Self-pay | Admitting: Medical-Surgical

## 2022-03-03 ENCOUNTER — Encounter: Payer: Self-pay | Admitting: Medical-Surgical

## 2022-03-07 ENCOUNTER — Other Ambulatory Visit: Payer: Self-pay | Admitting: Medical-Surgical

## 2022-03-07 DIAGNOSIS — G43911 Migraine, unspecified, intractable, with status migrainosus: Secondary | ICD-10-CM

## 2022-03-07 NOTE — Progress Notes (Signed)
Difficulty with determining what pain management is appropriate for her current symptoms.  Referring to neurology for evaluation and management of intractable migraines.  Please make sure this goes to a [place] location. ___________________________________________ Thayer Ohm, DNP, APRN, FNP-BC Primary Care and Sports Medicine [facility]

## 2022-03-14 ENCOUNTER — Encounter: Payer: Self-pay | Admitting: Emergency Medicine

## 2022-03-14 ENCOUNTER — Ambulatory Visit
Admission: EM | Admit: 2022-03-14 | Discharge: 2022-03-14 | Disposition: A | Discharge status: Home / Self Care | Payer: 59 | Attending: Family Medicine | Admitting: Family Medicine

## 2022-03-14 DIAGNOSIS — G43919 Migraine, unspecified, intractable, without status migrainosus: Secondary | ICD-10-CM | POA: Diagnosis not present

## 2022-03-14 MED ORDER — KETOROLAC TROMETHAMINE 30 MG/ML IJ SOLN
30.0000 mg | Freq: Once | INTRAMUSCULAR | Status: AC
  Administered 2022-03-14: 30 mg via INTRAMUSCULAR

## 2022-03-14 MED ORDER — DEXAMETHASONE SODIUM PHOSPHATE 10 MG/ML IJ SOLN: 10.0000 mg | Freq: Once | INTRAMUSCULAR | Status: DC

## 2022-03-14 NOTE — Discharge Instructions (Signed)
Follow-up with neurology You can call their office to schedule appointment and inquire about insurance information If you prefer a [facility] neurology I have included that phone number as well See Emma Palmer as scheduled next month

## 2022-03-14 NOTE — ED Notes (Signed)
Decadron refused by pt - Dr Delton See updated

## 2022-03-14 NOTE — ED Provider Notes (Signed)
[name]    CSN: [id number] Arrival date & time: 03/14/22  1041      History   Chief Complaint Chief Complaint  Patient presents with   Headache    HPI Emma Palmer is a 56 y.o. female.   HPI  Patient is known to me from her prior visit.  She has an eating disorder and appears quite malnourished.  She also has a history of migraines and currently is suffering from headache for 4 days.  She is hoping for a shot of Toradol.  She does have a referral for neurology pending in her chart.  Past Medical History:  Diagnosis Date   Anxiety    B12 deficiency    Bulimia    Depression    GERD (gastroesophageal reflux disease)    Glaucoma    H. pylori infection    + serolgies   Headache(784.0)    Hip dysplasia    left - dr supple   History of pelvic fracture    Hyperlipidemia    Intraocular pressure increase    dr Hazle Quant   Iron deficiency anemia    Nephrolithiasis    hx, dr Aldean Ast   OP (osteoporosis) 10/24/2006   dexa     Patient Active Problem List   Diagnosis Date Noted   Migraines 04/12/2018   Severe episode of recurrent major depressive disorder, without psychotic features (HCC) 10/03/2017   Gastric irritation 10/03/2017   Primary insomnia 10/03/2017   History of Helicobacter pylori infection 10/03/2017   Arthralgia 01/06/2017   Iron deficiency anemia 06/07/2016   OAB (overactive bladder) 06/20/2014   GERD (gastroesophageal reflux disease) 04/15/2014   Vitamin B12 deficiency 02/10/2014   Bulimia nervosa 02/10/2014   ANXIETY STATE, UNSPECIFIED 04/09/2010   SOLITARY BONE CYST 08/31/2007   Hyperlipidemia 11/15/2006   DEPRESSION 11/15/2006   ARTHRITIS, RIGHT HIP 11/15/2006   OSTEOPOROSIS 11/15/2006    Past Surgical History:  Procedure Laterality Date   ESOPHAGOGASTRODUODENOSCOPY  05-06-05   dr Leone Payor -normal including duodenal bxs   LITHOTRIPSY     stent inserted    OB History   No obstetric history on file.      Home  Medications    Prior to Admission medications   Medication Sig Start Date End Date Taking? Authorizing Provider  B Complex-C (B-COMPLEX WITH VITAMIN C) tablet Take 1 tablet by mouth daily.    [provider]  bimatoprost (LATISSE) 0.03 % ophthalmic solution Place 1 application into both eyes at bedtime. Place one drop on applicator and apply evenly along the skin of the upper eyelid at base of eyelashes once daily at bedtime; repeat procedure for second eye (use a clean applicator). Patient not taking: Reported on 03/14/2022 10/02/19   Nelwyn Salisbury, MD  butalbital-acetaminophen-caffeine (FIORICET) [phone] MG tablet Take 1 tablet by mouth daily as needed for headache. Patient not taking: Reported on 03/14/2022 02/21/22 02/21/23  Trevor Iha, FNP  Cyanocobalamin 2500 MCG SUBL Place under the tongue.    [provider]  Ensure Plus (ENSURE PLUS) LIQD Take 237 mLs by mouth daily.    [provider]  Folic Acid (FOLATE PO) Take 800 mcg by mouth 1 day or 1 dose.    [provider]  temazepam (RESTORIL) 30 MG capsule TAKE 1 CAPSULE BY MOUTH EVERY DAY AT BEDTIME 12/31/21   Christen Butter, NP    Family History Family History  Problem Relation Age of Onset   Hyperlipidemia Mother    Stroke Mother  Blindness Mother    Heart attack Mother    Depression Mother    Heart attack Father 32   Hypertension Father    Depression Father    Hyperlipidemia Father    Heart disease Maternal Grandmother    Stroke Maternal Grandmother    Stroke Maternal Grandfather    Heart disease Paternal Grandmother    Colon cancer Neg Hx     Social History Social History   Tobacco Use   Smoking status: Former    Packs/day: 0.50    Types: Cigarettes    Quit date: 2018    Years since quitting: 5.6   Smokeless tobacco: Never   Tobacco comments:    1/2 pack or less  Vaping Use   Vaping Use: Never used  Substance Use Topics   Alcohol use: No    Alcohol/week: 0.0 standard drinks  of alcohol   Drug use: No     Allergies   Restasis [cyclosporine] and Other   Review of Systems Review of Systems See HPI  Physical Exam Triage Vital Signs ED Triage Vitals  Enc Vitals Group     BP 03/14/22 1125 103/74     Pulse Rate 03/14/22 1125 (!) 58     Resp 03/14/22 1125 16     Temp 03/14/22 1125 98 F (36.7 C)     Temp Source 03/14/22 1125 Oral     SpO2 03/14/22 1125 100 %     Weight 03/14/22 1125 70 lb (31.8 kg)     Height --      Head Circumference --      Peak Flow --      Pain Score 03/14/22 1128 9     Pain Loc --      Pain Edu? --      Excl. in GC? --    No data found.  Updated Vital Signs BP 103/74 (BP Location: Right Arm)   Pulse (!) 58   Temp 98 F (36.7 C) (Oral)   Resp 16   Wt 31.8 kg   LMP 10/27/2010   SpO2 100%   BMI 13.67 kg/m       Physical Exam Constitutional:      General: She is not in acute distress.    Appearance: She is well-developed. She is ill-appearing.     Comments: Very thin.  Appears older than stated age.  Quite pleasant  HENT:     Head: Normocephalic and atraumatic.     Mouth/Throat:     Mouth: Mucous membranes are moist.  Eyes:     Extraocular Movements: Extraocular movements intact.     Conjunctiva/sclera: Conjunctivae normal.     Pupils: Pupils are equal, round, and reactive to light.  Cardiovascular:     Rate and Rhythm: Normal rate.  Pulmonary:     Effort: Pulmonary effort is normal. No respiratory distress.  Abdominal:     General: There is no distension.     Palpations: Abdomen is soft.  Musculoskeletal:        General: Normal range of motion.     Cervical back: Normal range of motion.  Skin:    General: Skin is warm and dry.  Neurological:     Mental Status: She is alert. Mental status is at baseline.     Cranial Nerves: No cranial nerve deficit, dysarthria or facial asymmetry.     Gait: Gait normal.  Psychiatric:        Mood and Affect: Mood normal.  Behavior: Behavior normal.       UC Treatments / Results  Labs (all labs ordered are listed, but only abnormal results are displayed) Labs Reviewed - No data to display  EKG   Radiology No results found.  Procedures Procedures (including critical care time)  Medications Ordered in UC Medications  dexamethasone (DECADRON) injection 10 mg (has no administration in time range)  ketorolac (TORADOL) 30 MG/ML injection 30 mg (30 mg Intramuscular Given 03/14/22 1150)    Initial Impression / Assessment and Plan / UC Course  I have reviewed the triage vital signs and the nursing notes.  Pertinent labs & imaging results that were available during my care of the patient were reviewed by me and considered in my medical decision making (see chart for details).     We will give Toradol.  Offered dexamethasone.  Patient declined Final Clinical Impressions(s) / UC Diagnoses   Final diagnoses:  Intractable migraine without status migrainosus, unspecified migraine type     Discharge Instructions      Follow-up with neurology You can call their office to schedule appointment and inquire about insurance information If you prefer a [facility] neurology I have included that phone number as well See Christen Butter as scheduled next month   ED Prescriptions   None    PDMP not reviewed this encounter.   Eustace Moore, MD 03/14/22 [phone]

## 2022-03-14 NOTE — ED Triage Notes (Signed)
Headache x 4 days  No pain meds OTC or prescribed  Pt states none of the meds help so she didn't take anything Pressure was on right the 1st 3 days, now on the left - resolved on right Light sensitivity is worse on cloudy days  Only eats once a day at night

## 2022-03-14 NOTE — ED Notes (Signed)
RN confirmed w/ PCP  office that a referral had been placed with Dr Trena Platt, MD. Information was sent on 03/07/22. Pt provide w/ contact info for Dr Langston Masker.

## 2022-04-04 ENCOUNTER — Ambulatory Visit: Payer: 59 | Admitting: Medical-Surgical

## 2022-04-10 NOTE — Progress Notes (Signed)
Complete physical exam  Patient: Emma Palmer   DOB: 1966/04/30   56 y.o. Female  MRN: [id number]  Subjective:    Chief Complaint  Patient presents with   Follow-up   Insomnia   Migraine   Annual Exam    Emma Palmer is a 56 y.o. female who presents today for a complete physical exam. She reports consuming a general and very restricted  diet.  Doing a lot of yard work.  She generally feels fair. She reports sleeping well when using Temazepam. She does have additional problems to discuss today.   Migraines- has had 2 visits to urgent care to get shots for the migraines. Had significant side effects from the injections and now doesn't want to do shots anymore.   Most recent fall risk assessment:    12/31/2021    8:55 AM  Fall Risk   Falls in the past year? 1  Number falls in past yr: 0  Injury with Fall? 1  Comment Left Superior ramos fracture  Risk for fall due to : History of fall(s)  Follow up Falls evaluation completed     Most recent depression screenings:    04/11/2022   11:45 AM 04/11/2022    9:25 AM  PHQ 2/9 Scores  PHQ - 2 Score 1 0   Vision:Within last year and Dental: No current dental problems and No regular dental care     Patient Care Team: Samuel Bouche, NP as PCP - General (Nurse Practitioner)   Outpatient Medications Prior to Visit  Medication Sig   B Complex-C (B-COMPLEX WITH VITAMIN C) tablet Take 1 tablet by mouth daily.   bimatoprost (LATISSE) 0.03 % ophthalmic solution Place 1 application into both eyes at bedtime. Place one drop on applicator and apply evenly along the skin of the upper eyelid at base of eyelashes once daily at bedtime; repeat procedure for second eye (use a clean applicator). (Patient not taking: Reported on 03/14/2022)   butalbital-acetaminophen-caffeine (FIORICET) 50-325-40 MG tablet Take 1 tablet by mouth daily as needed for headache. (Patient not taking: Reported on 03/14/2022)   Cyanocobalamin 2500 MCG SUBL Place under the  tongue.   Ensure Plus (ENSURE PLUS) LIQD Take 237 mLs by mouth daily.   Folic Acid (FOLATE PO) Take 800 mcg by mouth 1 day or 1 dose.   [DISCONTINUED] temazepam (RESTORIL) 30 MG capsule TAKE 1 CAPSULE BY MOUTH EVERY DAY AT BEDTIME   No facility-administered medications prior to visit.    Review of Systems  Constitutional:  Negative for chills, fever, malaise/fatigue and weight loss.  HENT:  Negative for congestion, ear pain, hearing loss, sinus pain and sore throat.   Eyes:  Negative for blurred vision, photophobia and pain.  Respiratory:  Negative for cough, shortness of breath and wheezing.   Cardiovascular:  Positive for leg swelling (mild in the evenings). Negative for chest pain and palpitations.  Gastrointestinal:  Negative for abdominal pain, constipation, diarrhea, heartburn, nausea and vomiting.  Genitourinary:  Negative for dysuria, frequency and urgency.  Musculoskeletal:  Negative for falls and neck pain.  Skin:  Negative for itching and rash.  Neurological:  Positive for tingling (BLE when sitting) and headaches. Negative for dizziness and weakness.  Endo/Heme/Allergies:  Negative for polydipsia. Does not bruise/bleed easily.  Psychiatric/Behavioral:  Positive for depression. Negative for substance abuse and suicidal ideas. The patient is nervous/anxious and has insomnia (better with Temazepam).      Objective:    BP 92/60 (BP Location: Left Arm,  Cuff Size: Small)   Pulse 62   Resp 20   Ht 5' (1.524 m)   Wt 72 lb 3.2 oz (32.7 kg)   LMP 10/27/2010   SpO2 100%   BMI 14.10 kg/m    Physical Exam Constitutional:      General: She is not in acute distress.    Appearance: Normal appearance. She is cachectic. She is not ill-appearing.  HENT:     Head: Normocephalic and atraumatic.     Right Ear: Tympanic membrane normal.     Left Ear: Tympanic membrane normal.     Nose: Nose normal.     Mouth/Throat:     Mouth: Mucous membranes are moist.     Pharynx: No  oropharyngeal exudate or posterior oropharyngeal erythema.  Eyes:     Extraocular Movements: Extraocular movements intact.     Conjunctiva/sclera: Conjunctivae normal.     Pupils: Pupils are equal, round, and reactive to light.  Neck:     Thyroid: No thyromegaly.     Vascular: No carotid bruit or JVD.     Trachea: Trachea normal.  Cardiovascular:     Rate and Rhythm: Normal rate and regular rhythm.     Pulses: Normal pulses.     Heart sounds: Normal heart sounds. No murmur heard.    No friction rub. No gallop.  Pulmonary:     Effort: Pulmonary effort is normal. No respiratory distress.     Breath sounds: Normal breath sounds. No wheezing.  Abdominal:     General: Bowel sounds are normal. There is no distension.     Palpations: Abdomen is soft.     Tenderness: There is no abdominal tenderness. There is no guarding.  Musculoskeletal:        General: Normal range of motion.     Cervical back: Normal range of motion and neck supple.  Skin:    General: Skin is warm and dry.  Neurological:     Mental Status: She is alert and oriented to person, place, and time.     Cranial Nerves: No cranial nerve deficit.  Psychiatric:        Mood and Affect: Mood normal.        Behavior: Behavior normal.        Thought Content: Thought content normal.        Judgment: Judgment normal.      No results found for any visits on 04/11/22.     Assessment & Plan:    Routine Health Maintenance and Physical Exam  Immunization History  Administered Date(s) Administered   Tdap 03/17/2016    Health Maintenance  Topic Date Due   HIV Screening  Never done   Hepatitis C Screening  Never done   PAP SMEAR-Modifier  05/11/2012   COLONOSCOPY (Pts 45-6yrs Insurance coverage will need to be confirmed)  05/07/2015   COVID-19 Vaccine (1) 04/24/2022 (Originally 02/12/1966)   MAMMOGRAM  01/02/2024   TETANUS/TDAP  03/17/2026   HPV VACCINES  Aged Out   INFLUENZA VACCINE  Discontinued   Zoster Vaccines-  Shingrix  Discontinued    Discussed health benefits of physical activity, and encouraged her to engage in regular exercise appropriate for her age and condition.  1. Annual physical exam Checking labs. UTD on preventative care but needs to update dental exam. Wellness information provided with AVS.   2. Vitamin B12 deficiency Checking B12.  - B12  3. Mixed hyperlipidemia Checking labs.  - Lipid panel - COMPLETE METABOLIC PANEL WITH GFR  4. Primary insomnia Continue Temazepam 30mg  nightly.   5. Iron deficiency anemia, unspecified iron deficiency anemia type Checking labs as below.  - CBC with Differential/Platelet - Fe+TIBC+Fer  6. Gastroesophageal reflux disease, unspecified whether esophagitis present Avoid food triggers. Recommend elimination diet to determine further potential triggers if symptoms not managed.   7.  Influenza vaccination declined Declined today.   8. Colon cancer screening Declined colonoscopy. Ok with Cologuard. Ordered today.  - Cologuard  Return in about 6 months (around 10/10/2022) for chronic disease follow up.   Samuel Bouche, NP

## 2022-04-11 ENCOUNTER — Encounter: Payer: Self-pay | Admitting: Medical-Surgical

## 2022-04-11 ENCOUNTER — Ambulatory Visit (INDEPENDENT_AMBULATORY_CARE_PROVIDER_SITE_OTHER): Payer: 59 | Admitting: Medical-Surgical

## 2022-04-11 VITALS — BP 92/60 | HR 62 | Resp 20 | Ht 60.0 in | Wt 72.2 lb

## 2022-04-11 DIAGNOSIS — K219 Gastro-esophageal reflux disease without esophagitis: Secondary | ICD-10-CM | POA: Diagnosis not present

## 2022-04-11 DIAGNOSIS — D509 Iron deficiency anemia, unspecified: Secondary | ICD-10-CM

## 2022-04-11 DIAGNOSIS — R69 Illness, unspecified: Secondary | ICD-10-CM | POA: Diagnosis not present

## 2022-04-11 DIAGNOSIS — G43911 Migraine, unspecified, intractable, with status migrainosus: Secondary | ICD-10-CM

## 2022-04-11 DIAGNOSIS — E538 Deficiency of other specified B group vitamins: Secondary | ICD-10-CM

## 2022-04-11 DIAGNOSIS — E782 Mixed hyperlipidemia: Secondary | ICD-10-CM | POA: Diagnosis not present

## 2022-04-11 DIAGNOSIS — Z0000 Encounter for general adult medical examination without abnormal findings: Secondary | ICD-10-CM | POA: Diagnosis not present

## 2022-04-11 DIAGNOSIS — Z23 Encounter for immunization: Secondary | ICD-10-CM

## 2022-04-11 DIAGNOSIS — F5101 Primary insomnia: Secondary | ICD-10-CM | POA: Diagnosis not present

## 2022-04-11 DIAGNOSIS — Z1211 Encounter for screening for malignant neoplasm of colon: Secondary | ICD-10-CM

## 2022-04-11 DIAGNOSIS — Z2821 Immunization not carried out because of patient refusal: Secondary | ICD-10-CM | POA: Diagnosis not present

## 2022-04-11 MED ORDER — TEMAZEPAM 30 MG PO CAPS: ORAL_CAPSULE | ORAL | 2 refills | Status: DC

## 2022-04-11 NOTE — Patient Instructions (Addendum)
[facility] [place] at [phone]

## 2022-04-12 LAB — IRON,TIBC AND FERRITIN PANEL
%SAT: 20 % (calc) (ref 16–45)
Ferritin: 38 ng/mL (ref 16–232)
Iron: 68 ug/dL (ref 45–160)
TIBC: 333 mcg/dL (calc) (ref 250–450)

## 2022-04-12 LAB — CBC WITH DIFFERENTIAL/PLATELET
Absolute Monocytes: 402 cells/uL (ref 200–950)
Basophils Absolute: 29 cells/uL (ref 0–200)
Basophils Relative: 0.7 %
Eosinophils Absolute: 29 cells/uL (ref 15–500)
Eosinophils Relative: 0.7 %
HCT: 37.9 % (ref 35.0–45.0)
Hemoglobin: 13.6 g/dL (ref 11.7–15.5)
Lymphs Abs: 1648 cells/uL (ref 850–3900)
MCH: 32.9 pg (ref 27.0–33.0)
MCHC: 35.9 g/dL (ref 32.0–36.0)
MCV: 91.8 fL (ref 80.0–100.0)
MPV: 8.3 fL (ref 7.5–12.5)
Monocytes Relative: 9.8 %
Neutro Abs: 1993 cells/uL (ref 1500–7800)
Neutrophils Relative %: 48.6 %
Platelets: 395 10*3/uL (ref 140–400)
RBC: 4.13 10*6/uL (ref 3.80–5.10)
RDW: 13.3 % (ref 11.0–15.0)
Total Lymphocyte: 40.2 %
WBC: 4.1 10*3/uL (ref 3.8–10.8)

## 2022-04-12 LAB — LIPID PANEL
Cholesterol: 209 mg/dL — ABNORMAL HIGH (ref <200)
HDL: 49 mg/dL — ABNORMAL LOW (ref >=50)
LDL Cholesterol (Calc): 127 mg/dL (calc) — ABNORMAL HIGH
Non-HDL Cholesterol (Calc): 160 mg/dL (calc) — ABNORMAL HIGH (ref <130)
Total CHOL/HDL Ratio: 4.3 (calc) (ref <5.0)
Triglycerides: 192 mg/dL — ABNORMAL HIGH (ref <150)

## 2022-04-12 LAB — COMPLETE METABOLIC PANEL WITH GFR
AG Ratio: 2.1 (calc) (ref 1.0–2.5)
ALT: 40 U/L — ABNORMAL HIGH (ref 6–29)
AST: 33 U/L (ref 10–35)
Albumin: 4 g/dL (ref 3.6–5.1)
Alkaline phosphatase (APISO): 126 U/L (ref 37–153)
BUN/Creatinine Ratio: 3 (calc) — ABNORMAL LOW (ref 6–22)
BUN: 2 mg/dL — ABNORMAL LOW (ref 7–25)
CO2: 28 mmol/L (ref 20–32)
Calcium: 9.3 mg/dL (ref 8.6–10.4)
Chloride: 97 mmol/L — ABNORMAL LOW (ref 98–110)
Creat: 0.63 mg/dL (ref 0.50–1.03)
Globulin: 1.9 g/dL (calc) (ref 1.9–3.7)
Glucose, Bld: 79 mg/dL (ref 65–99)
Potassium: 4.5 mmol/L (ref 3.5–5.3)
Sodium: 132 mmol/L — ABNORMAL LOW (ref 135–146)
Total Bilirubin: 0.5 mg/dL (ref 0.2–1.2)
Total Protein: 5.9 g/dL — ABNORMAL LOW (ref 6.1–8.1)
eGFR: 105 mL/min/{1.73_m2} (ref >=60)

## 2022-04-12 LAB — VITAMIN B12: Vitamin B-12: >2000 pg/mL — ABNORMAL HIGH (ref 200–1100)

## 2022-04-15 DIAGNOSIS — Z1211 Encounter for screening for malignant neoplasm of colon: Secondary | ICD-10-CM | POA: Diagnosis not present

## 2022-04-15 LAB — COLOGUARD: Cologuard: NEGATIVE

## 2022-04-26 LAB — COLOGUARD: COLOGUARD: NEGATIVE

## 2022-05-26 ENCOUNTER — Ambulatory Visit
Admission: EM | Admit: 2022-05-26 | Discharge: 2022-05-26 | Disposition: A | Discharge status: Home / Self Care | Payer: 59 | Attending: Family Medicine | Admitting: Family Medicine

## 2022-05-26 ENCOUNTER — Ambulatory Visit (INDEPENDENT_AMBULATORY_CARE_PROVIDER_SITE_OTHER): Payer: 59

## 2022-05-26 DIAGNOSIS — M25552 Pain in left hip: Secondary | ICD-10-CM

## 2022-05-26 DIAGNOSIS — M25551 Pain in right hip: Secondary | ICD-10-CM

## 2022-05-26 MED ORDER — CELECOXIB 100 MG PO CAPS: 100.0000 mg | ORAL_CAPSULE | Freq: Two times a day (BID) | ORAL | 0 refills | Status: AC

## 2022-05-26 NOTE — Discharge Instructions (Addendum)
Advised patient of bilateral hip/pelvis x-ray results with hard copy provided to patient.  Advised patient to take medication as directed with food to completion.  Encouraged patient increase daily water intake while taking this medication.  Advised patient to avoid working in yard for the next 3 days to allow hips to rest.  Advised if symptoms worsen and/or unresolved please follow-up with PCP or here for further evaluation.

## 2022-05-26 NOTE — ED Triage Notes (Signed)
Pt c/o LT hip pain x 2 weeks. 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 Hx of fx. Denies injury this time.

## 2022-05-26 NOTE — ED Provider Notes (Signed)
[name]    CSN: [id number] Arrival date & time: 05/26/22  0940      History   Chief Complaint Chief Complaint  Patient presents with   Hip Pain    LT    HPI Emma Palmer is a 56 y.o. female.   HPI 56 year old female presents with left hip pain for 2 weeks.  Patient reports that she was born with a shallow hip socket.  Patient reports history of hip fracture.  Denies injury or insult.  PMH significant for bulimia, headache, left hip dysplasia, history of pelvic fracture, and osteoporosis per DEXA scan.  Patient reports raking leaves in her yard and performing other yard work tasks for the past several days which may have exacerbated hip pain.  Past Medical History:  Diagnosis Date   Anxiety    B12 deficiency    Bulimia    Depression    GERD (gastroesophageal reflux disease)    Glaucoma    H. pylori infection    + serolgies   Headache(784.0)    Hip dysplasia    left - dr supple   History of pelvic fracture    Hyperlipidemia    Intraocular pressure increase    dr Hazle Quant   Iron deficiency anemia    Nephrolithiasis    hx, dr Aldean Ast   OP (osteoporosis) 10/24/2006   dexa     Patient Active Problem List   Diagnosis Date Noted   Excessive tanning 05/19/2021   History of bulimia 05/19/2021   Irritable bowel syndrome with both constipation and diarrhea 05/19/2021   Underweight 05/19/2021   Migraines 04/12/2018   Severe episode of recurrent major depressive disorder, without psychotic features (HCC) 10/03/2017   Gastric irritation 10/03/2017   Primary insomnia 10/03/2017   History of Helicobacter pylori infection 10/03/2017   Arthralgia 01/06/2017   Iron deficiency anemia 06/07/2016   OAB (overactive bladder) 06/20/2014   GERD (gastroesophageal reflux disease) 04/15/2014   Vitamin B12 deficiency 02/10/2014   Bulimia nervosa 02/10/2014   Anorexia nervosa, restricting type 02/10/2014   ANXIETY STATE, UNSPECIFIED 04/09/2010   SOLITARY BONE CYST  08/31/2007   Hyperlipidemia 11/15/2006   DEPRESSION 11/15/2006   ARTHRITIS, RIGHT HIP 11/15/2006   OSTEOPOROSIS 11/15/2006    Past Surgical History:  Procedure Laterality Date   ESOPHAGOGASTRODUODENOSCOPY  05-06-05   dr Leone Payor -normal including duodenal bxs   LITHOTRIPSY     stent inserted    OB History   No obstetric history on file.      Home Medications    Prior to Admission medications   Medication Sig Start Date End Date Taking? Authorizing Provider  celecoxib (CELEBREX) 100 MG capsule Take 1 capsule (100 mg total) by mouth 2 (two) times daily for 15 days. 05/26/22 06/10/22 Yes Trevor Iha, FNP  B Complex-C (B-COMPLEX WITH VITAMIN C) tablet Take 1 tablet by mouth daily.    [provider]  bimatoprost (LATISSE) 0.03 % ophthalmic solution Place 1 application into both eyes at bedtime. Place one drop on applicator and apply evenly along the skin of the upper eyelid at base of eyelashes once daily at bedtime; repeat procedure for second eye (use a clean applicator). Patient not taking: Reported on 03/14/2022 10/02/19   Nelwyn Salisbury, MD  butalbital-acetaminophen-caffeine (FIORICET) [phone] MG tablet Take 1 tablet by mouth daily as needed for headache. Patient not taking: Reported on 03/14/2022 02/21/22 02/21/23  Trevor Iha, FNP  Cyanocobalamin 2500 MCG SUBL Place under the tongue.    [provider]  Ensure Plus (ENSURE PLUS) LIQD Take 237 mLs by mouth daily.    [provider]  Folic Acid (FOLATE PO) Take 800 mcg by mouth 1 day or 1 dose.    [provider]  temazepam (RESTORIL) 30 MG capsule TAKE 1 CAPSULE BY MOUTH EVERY DAY AT BEDTIME 04/11/22   Christen Butter, NP    Family History Family History  Problem Relation Age of Onset   Hyperlipidemia Mother    Stroke Mother    Blindness Mother    Heart attack Mother    Depression Mother    Heart attack Father 28   Hypertension Father    Depression Father    Hyperlipidemia Father     Heart disease Maternal Grandmother    Stroke Maternal Grandmother    Stroke Maternal Grandfather    Heart disease Paternal Grandmother    Colon cancer Neg Hx     Social History Social History   Tobacco Use   Smoking status: Former    Packs/day: 0.50    Types: Cigarettes    Quit date: 2018    Years since quitting: 5.8   Smokeless tobacco: Never   Tobacco comments:    1/2 pack or less  Vaping Use   Vaping Use: Never used  Substance Use Topics   Alcohol use: No    Alcohol/week: 0.0 standard drinks of alcohol   Drug use: No     Allergies   Restasis [cyclosporine] and Other   Review of Systems Review of Systems  Musculoskeletal:        Left hip pain x2 weeks  All other systems reviewed and are negative.    Physical Exam Triage Vital Signs ED Triage Vitals  Enc Vitals Group     BP 05/26/22 1016 100/69     Pulse Rate 05/26/22 1014 65     Resp 05/26/22 1014 16     Temp 05/26/22 1014 97.8 F (36.6 C)     Temp Source 05/26/22 1014 Oral     SpO2 05/26/22 1014 100 %     Weight --      Height --      Head Circumference --      Peak Flow --      Pain Score 05/26/22 1015 5     Pain Loc --      Pain Edu? --      Excl. in GC? --    No data found.  Updated Vital Signs BP 100/69   Pulse 65   Temp 97.8 F (36.6 C) (Oral)   Resp 16   LMP 10/27/2010   SpO2 100%     Physical Exam Vitals and nursing note reviewed.  Constitutional:      General: She is not in acute distress.    Appearance: Normal appearance. She is normal weight. She is ill-appearing.  HENT:     Head: Normocephalic and atraumatic.     Mouth/Throat:     Mouth: Mucous membranes are moist.     Pharynx: Oropharynx is clear.  Eyes:     Extraocular Movements: Extraocular movements intact.     Conjunctiva/sclera: Conjunctivae normal.     Pupils: Pupils are equal, round, and reactive to light.  Cardiovascular:     Rate and Rhythm: Normal rate and regular rhythm.     Pulses: Normal pulses.      Heart sounds: Normal heart sounds.  Pulmonary:     Effort: Pulmonary effort is normal.     Breath sounds:  Normal breath sounds. No wheezing, rhonchi or rales.  Musculoskeletal:        General: Normal range of motion.     Cervical back: Normal range of motion and neck supple.     Comments: Patient reporting tenderness over her left hip, inferior lateral aspect.  Denies injury to this area while working in yard raking leaves.  Skin:    General: Skin is warm and dry.  Neurological:     General: No focal deficit present.     Mental Status: She is alert and oriented to person, place, and time.      UC Treatments / Results  Labs (all labs ordered are listed, but only abnormal results are displayed) Labs Reviewed - No data to display  EKG   Radiology DG HIPS BILAT WITH PELVIS 3-4 VIEWS  Result Date: 05/26/2022 CLINICAL DATA:  Bilateral hip pain EXAM: DG HIP (WITH OR WITHOUT PELVIS) 3-4V BILAT COMPARISON:  11/04/2021 FINDINGS: No recent fracture or dislocation is seen. Deformities are seen in the left superior pubic ramus and bilateral inferior pubic rami consistent with old healed fractures. There is mild deformity of left femoral head which appears stable. This may be a congenital variation or residual change from remote injury. IMPRESSION: No recent fracture or dislocation is seen. Other chronic findings as described in the body of the report. Electronically Signed   By: Ernie Avena M.D.   On: 05/26/2022 10:56    Procedures Procedures (including critical care time)  Medications Ordered in UC Medications - No data to display  Initial Impression / Assessment and Plan / UC Course  I have reviewed the triage vital signs and the nursing notes.  Pertinent labs & imaging results that were available during my care of the patient were reviewed by me and considered in my medical decision making (see chart for details).     MDM: 1.  Bilateral hip pain-bilateral hip with pelvis  revealed above.  Rx'd Celebrex. Advised patient of bilateral hip/pelvis x-ray results with hard copy provided to patient.  Advised patient to take medication as directed with food to completion.  Encouraged patient increase daily water intake while taking this medication.  Advised patient to avoid working in yard for the next 3 days to allow hips to rest.  Advised if symptoms worsen and/or unresolved please follow-up with PCP or here for further evaluation.  Patient discharged home, hemodynamically stable. Final Clinical Impressions(s) / UC Diagnoses   Final diagnoses:  Bilateral hip pain     Discharge Instructions      Advised patient of bilateral hip/pelvis x-ray results with hard copy provided to patient.  Advised patient to take medication as directed with food to completion.  Encouraged patient increase daily water intake while taking this medication.  Advised patient to avoid working in yard for the next 3 days to allow hips to rest.  Advised if symptoms worsen and/or unresolved please follow-up with PCP or here for further evaluation.     ED Prescriptions     Medication Sig Dispense Auth. Provider   celecoxib (CELEBREX) 100 MG capsule Take 1 capsule (100 mg total) by mouth 2 (two) times daily for 15 days. 30 capsule Trevor Iha, FNP      PDMP not reviewed this encounter.   Trevor Iha, FNP 05/26/22 1153

## 2022-07-21 ENCOUNTER — Encounter: Payer: Self-pay | Admitting: Medical-Surgical

## 2022-07-21 MED ORDER — TEMAZEPAM 30 MG PO CAPS: ORAL_CAPSULE | ORAL | 0 refills | Status: DC

## 2022-07-21 NOTE — Telephone Encounter (Signed)
Pended  RX

## 2022-07-26 ENCOUNTER — Encounter: Payer: Self-pay | Admitting: Medical-Surgical

## 2022-07-26 ENCOUNTER — Other Ambulatory Visit: Payer: Self-pay | Admitting: Medical-Surgical

## 2022-07-27 ENCOUNTER — Encounter: Payer: Self-pay | Admitting: Medical-Surgical

## 2022-09-20 ENCOUNTER — Telehealth: Payer: Self-pay

## 2022-09-20 ENCOUNTER — Ambulatory Visit
Admission: EM | Admit: 2022-09-20 | Discharge: 2022-09-20 | Disposition: A | Discharge status: Home / Self Care | Payer: 59 | Attending: Emergency Medicine | Admitting: Emergency Medicine

## 2022-09-20 ENCOUNTER — Encounter: Payer: Self-pay | Admitting: Emergency Medicine

## 2022-09-20 DIAGNOSIS — R202 Paresthesia of skin: Secondary | ICD-10-CM

## 2022-09-20 DIAGNOSIS — K121 Other forms of stomatitis: Secondary | ICD-10-CM | POA: Diagnosis not present

## 2022-09-20 MED ORDER — LIDOCAINE VISCOUS HCL 2 % MT SOLN: 15.0000 mL | Freq: Three times a day (TID) | OROMUCOSAL | 0 refills | Status: DC | PRN

## 2022-09-20 NOTE — Discharge Instructions (Addendum)
Please follow with your primary care provider regarding your symptoms. They will be able to repeat your blood work and assess for any abnormalities  Use the mouth rinse, swish and spit, 3x daily to reduce pain.

## 2022-09-20 NOTE — Telephone Encounter (Signed)
Patient came into office after leaving urgent care and wanted to inform that she is taking thiamine and has calcium in it, patient has also been taking magnesium, and wanted to know if its ok. Patient has also cut out B-Complex due to having B-12 in it, please advise,thanks.

## 2022-09-20 NOTE — ED Triage Notes (Signed)
Patient c/o tingling in her legs and headaches x 1 week.  No injury.

## 2022-09-20 NOTE — ED Provider Notes (Signed)
[name]    CSN: [id number] Arrival date & time: 09/20/22  [id number]      History   Chief Complaint Chief Complaint  Patient presents with   Muscle Weakness    HPI Emma Palmer is a 57 y.o. female.  Here with 1 week history of some tingling in the lower legs. Starts in the mid leg and goes to toes. Not every day. Usually positional - laying or sitting No weakness. No injury. No tingling from the knees up. Hips are fine.  Also reports a pain in her mouth, under the tongue. She sometimes gets this pain on the sides of her tongue  History of b12 deficiency. She had blood work about 6 months ago and PCP took her off supplements. She has follow up in 3 weeks  Past Medical History:  Diagnosis Date   Anxiety    B12 deficiency    Bulimia    Depression    GERD (gastroesophageal reflux disease)    Glaucoma    H. pylori infection    + serolgies   Headache(784.0)    Hip dysplasia    left - dr supple   History of pelvic fracture    Hyperlipidemia    Intraocular pressure increase    dr Bing Plume   Iron deficiency anemia    Nephrolithiasis    hx, dr Serita Butcher   OP (osteoporosis) 10/24/2006   dexa     Patient Active Problem List   Diagnosis Date Noted   Excessive tanning 05/19/2021   History of bulimia 05/19/2021   Irritable bowel syndrome with both constipation and diarrhea 05/19/2021   Underweight 05/19/2021   Migraines 04/12/2018   Severe episode of recurrent major depressive disorder, without psychotic features ([place]) 10/03/2017   Gastric irritation 10/03/2017   Primary insomnia 123XX123   History of Helicobacter pylori infection 10/03/2017   Arthralgia 01/06/2017   Iron deficiency anemia 06/07/2016   OAB (overactive bladder) 06/20/2014   GERD (gastroesophageal reflux disease) 04/15/2014   Vitamin B12 deficiency 02/10/2014   Bulimia nervosa 02/10/2014   Anorexia nervosa, restricting type 02/10/2014   ANXIETY STATE, UNSPECIFIED 04/09/2010   SOLITARY  BONE CYST 08/31/2007   Hyperlipidemia 11/15/2006   DEPRESSION 11/15/2006   ARTHRITIS, RIGHT HIP 11/15/2006   OSTEOPOROSIS 11/15/2006    Past Surgical History:  Procedure Laterality Date   ESOPHAGOGASTRODUODENOSCOPY  05-06-05   dr Carlean Purl -normal including duodenal bxs   LITHOTRIPSY     stent inserted    OB History   No obstetric history on file.      Home Medications    Prior to Admission medications   Medication Sig Start Date End Date Taking? Authorizing Provider  lidocaine (XYLOCAINE) 2 % solution Use as directed 15 mLs in the mouth or throat 3 (three) times daily as needed for mouth pain. 09/20/22  Yes Shelbylynn Walczyk, PA-C  B Complex-C (B-COMPLEX WITH VITAMIN C) tablet Take 1 tablet by mouth daily.    [provider]  bimatoprost (LATISSE) 0.03 % ophthalmic solution Place 1 application into both eyes at bedtime. Place one drop on applicator and apply evenly along the skin of the upper eyelid at base of eyelashes once daily at bedtime; repeat procedure for second eye (use a clean applicator). Patient not taking: Reported on 03/14/2022 10/02/19   Laurey Morale, MD  butalbital-acetaminophen-caffeine (FIORICET) [id number] MG tablet Take 1 tablet by mouth daily as needed for headache. Patient not taking: Reported on 03/14/2022 02/21/22 02/21/23  Eliezer Lofts, FNP  Cyanocobalamin  [place] under the tongue.    [provider]  Ensure Plus (ENSURE PLUS) LIQD Take 237 mLs by mouth daily.    [provider]  Folic Acid (FOLATE PO) Take 800 mcg by mouth 1 day or 1 dose.    [provider]  temazepam (RESTORIL) 30 MG capsule TAKE 1 CAPSULE BY MOUTH EVERY DAY AT BEDTIME 07/21/22   Samuel Bouche, NP    Family History Family History  Problem Relation Age of Onset   Hyperlipidemia Mother    Stroke Mother    Blindness Mother    Heart attack Mother    Depression Mother    Heart attack Father 69   Hypertension Father    Depression Father     Hyperlipidemia Father    Heart disease Maternal Grandmother    Stroke Maternal Grandmother    Stroke Maternal Grandfather    Heart disease Paternal Grandmother    Colon cancer Neg Hx     Social History Social History   Tobacco Use   Smoking status: Former    Packs/day: 0.50    Types: Cigarettes    Quit date: 2018    Years since quitting: 6.1   Smokeless tobacco: Never   Tobacco comments:    1/2 pack or less  Vaping Use   Vaping Use: Never used  Substance Use Topics   Alcohol use: No    Alcohol/week: 0.0 standard drinks of alcohol   Drug use: No     Allergies   Restasis [cyclosporine] and Other   Review of Systems Review of Systems As per HPI  Physical Exam Triage Vital Signs ED Triage Vitals  Enc Vitals Group     BP 09/20/22 0927 118/80     Pulse Rate 09/20/22 0927 64     Resp 09/20/22 0927 16     Temp 09/20/22 0927 98.4 F (36.9 C)     Temp Source 09/20/22 0927 Oral     SpO2 09/20/22 0927 100 %     Weight 09/20/22 0929 70 lb (31.8 kg)     Height 09/20/22 0929 5' (1.524 m)     Head Circumference --      Peak Flow --      Pain Score 09/20/22 0929 0     Pain Loc --      Pain Edu? --      Excl. in [place]? --    No data found.  Updated Vital Signs BP 118/80 (BP Location: Right Arm)   Pulse 64   Temp 98.4 F (36.9 C) (Oral)   Resp 16   Ht 5' (1.524 m)   Wt 70 lb (31.8 kg)   LMP 10/27/2010   SpO2 100%   BMI 13.67 kg/m   Physical Exam Vitals and nursing note reviewed.  HENT:     Mouth/Throat:     Mouth: Mucous membranes are moist.     Tongue: Lesions present.     Pharynx: Oropharynx is clear.     Comments: Small singular ulcer under the tongue at frenum. No lesions on cheeks or gums Cardiovascular:     Rate and Rhythm: Normal rate and regular rhythm.  Pulmonary:     Effort: Pulmonary effort is normal.     Breath sounds: Normal breath sounds.  Musculoskeletal:        General: No swelling, tenderness, deformity or signs of injury. Normal range  of motion.     Cervical back: Normal range of motion.  Right lower leg: No edema.     Left lower leg: No edema.     Comments: Full ROM at hips, knees, ankles bilaterally. Strength is 5/5. Sensation equal and intact. Strong DP and PT pulses. Skin is warm and dry. No swelling. Legs are non tender to palpation.  Skin:    General: Skin is warm and dry.  Neurological:     General: No focal deficit present.     Gait: Gait normal.     UC Treatments / Results  Labs (all labs ordered are listed, but only abnormal results are displayed) Labs Reviewed - No data to display  EKG  Radiology No results found.  Procedures Procedures (including critical care time)  Medications Ordered in UC Medications - No data to display  Initial Impression / Assessment and Plan / UC Course  I have reviewed the triage vital signs and the nursing notes.  Pertinent labs & imaging results that were available during my care of the patient were reviewed by me and considered in my medical decision making (see chart for details).  No red flags. Normal exam. Discussed monitoring symptoms, avoiding the positions that cause tingling. She has PCP follow up in 3 weeks and will have blood work re-checked. Do not see need to check any labs today. I have advised her to call PCP and move appointment up if necessary.  For oral ulcer, lidocaine swish and spit TID prn  Final Clinical Impressions(s) / UC Diagnoses   Final diagnoses:  Tingling in extremities  Oral ulceration     Discharge Instructions      Please follow with your primary care provider regarding your symptoms. They will be able to repeat your blood work and assess for any abnormalities  Use the mouth rinse, swish and spit, 3x daily to reduce pain.    ED Prescriptions     Medication Sig Dispense Auth. Provider   lidocaine (XYLOCAINE) 2 % solution Use as directed 15 mLs in the mouth or throat 3 (three) times daily as needed for mouth pain. 20  mL Celise Bazar, Wells Guiles, PA-C      PDMP not reviewed this encounter.   Ronalda Walpole, Wells Guiles, [place] 09/20/22 1025

## 2022-09-28 ENCOUNTER — Other Ambulatory Visit: Payer: Self-pay | Admitting: Medical-Surgical

## 2022-09-29 ENCOUNTER — Ambulatory Visit
Admission: EM | Admit: 2022-09-29 | Discharge: 2022-09-29 | Disposition: A | Discharge status: Home / Self Care | Payer: 59 | Attending: Urgent Care | Admitting: Urgent Care

## 2022-09-29 DIAGNOSIS — H53143 Visual discomfort, bilateral: Secondary | ICD-10-CM

## 2022-09-29 DIAGNOSIS — E871 Hypo-osmolality and hyponatremia: Secondary | ICD-10-CM | POA: Diagnosis not present

## 2022-09-29 DIAGNOSIS — R519 Headache, unspecified: Secondary | ICD-10-CM | POA: Diagnosis not present

## 2022-09-29 DIAGNOSIS — G43111 Migraine with aura, intractable, with status migrainosus: Secondary | ICD-10-CM | POA: Diagnosis not present

## 2022-09-29 MED ORDER — RIZATRIPTAN BENZOATE 10 MG PO TBDP: 10.0000 mg | ORAL_TABLET | ORAL | 0 refills | Status: DC | PRN

## 2022-09-29 NOTE — ED Triage Notes (Signed)
Pt c/o headache with sensitivity to light that started yesterday. No OTC meds tried. Hx of migraines but does not have rx meds for them either. Has PCP appt on 3/18.

## 2022-09-29 NOTE — ED Provider Notes (Signed)
[facility]    CSN: [id number] Arrival date & time: 09/29/22  1416      History   Chief Complaint Chief Complaint  Patient presents with   Headache    HPI Emma Palmer is a 57 y.o. female.   Pleasant 57 year old female with a known history of migraines presents today due to concerns of a left sided headache and eye pain.  Patient states that for the past 2 weeks she has been having significant light sensitivity bilaterally, but usually worse on the left.  She states that yesterday she was wearing her sunglasses and went outside to work.  She came in and developed a headache on the left frontal/temporal region.  She states it is excruciatingly painful and has not gone away. She feels that her vision is hazy. She did not take any medications for this.  She states she is concerned that there is something in her head due to a known history of a pineal gland cyst noted on MRI dating back in 2015.  She denies any dizziness, tinnitus or gait disturbance.  She denies any slurred speech or weakness.  She denies any facial drooping although she states several weeks ago her left eyelid did droop, but that has since resolved.  Patient also states recently having had significant paresthesias and foot drop, that apparently responded very well to oral thiamine supplementation.  Patient admits to a history of anorexia, has been taking numerous supplements recently.  She denies any alcohol intake. She denies nuchal rigidity or fever. Additionally, pt denies pressure behind her eyes, vision loss, pain with EOM, swelling around the eye or discharge from the eyes.    Headache   Past Medical History:  Diagnosis Date   Anxiety    B12 deficiency    Bulimia    Depression    GERD (gastroesophageal reflux disease)    Glaucoma    H. pylori infection    + serolgies   Headache(784.0)    Hip dysplasia    left - dr supple   History of pelvic fracture    Hyperlipidemia    Intraocular pressure  increase    dr Bing Plume   Iron deficiency anemia    Nephrolithiasis    hx, dr Serita Butcher   OP (osteoporosis) 10/24/2006   dexa     Patient Active Problem List   Diagnosis Date Noted   Excessive tanning 05/19/2021   History of bulimia 05/19/2021   Irritable bowel syndrome with both constipation and diarrhea 05/19/2021   Underweight 05/19/2021   Migraines 04/12/2018   Severe episode of recurrent major depressive disorder, without psychotic features ([place]) 10/03/2017   Gastric irritation 10/03/2017   Primary insomnia 123XX123   History of Helicobacter pylori infection 10/03/2017   Arthralgia 01/06/2017   Iron deficiency anemia 06/07/2016   OAB (overactive bladder) 06/20/2014   GERD (gastroesophageal reflux disease) 04/15/2014   Vitamin B12 deficiency 02/10/2014   Bulimia nervosa 02/10/2014   Anorexia nervosa, restricting type 02/10/2014   ANXIETY STATE, UNSPECIFIED 04/09/2010   SOLITARY BONE CYST 08/31/2007   Hyperlipidemia 11/15/2006   DEPRESSION 11/15/2006   ARTHRITIS, RIGHT HIP 11/15/2006   OSTEOPOROSIS 11/15/2006    Past Surgical History:  Procedure Laterality Date   ESOPHAGOGASTRODUODENOSCOPY  05-06-05   dr Carlean Purl -normal including duodenal bxs   LITHOTRIPSY     stent inserted    OB History   No obstetric history on file.      Home Medications    Prior to Admission medications  Medication Sig Start Date End Date Taking? Authorizing Provider  rizatriptan (MAXALT-MLT) 10 MG disintegrating tablet Take 1 tablet (10 mg total) by mouth as needed for migraine. 09/29/22  Yes Xavian Hardcastle L, PA  B Complex-C (B-COMPLEX WITH VITAMIN C) tablet Take 1 tablet by mouth daily.    [provider]  Cyanocobalamin 2500 MCG SUBL Place under the tongue.    [provider]  Ensure Plus (ENSURE PLUS) LIQD Take 237 mLs by mouth daily.    [provider]  Folic Acid (FOLATE PO) Take 800 mcg by mouth 1 day or 1 dose.    [provider]  lidocaine  (XYLOCAINE) 2 % solution Use as directed 15 mLs in the mouth or throat 3 (three) times daily as needed for mouth pain. 09/20/22   Rising, Wells Guiles, PA-C  temazepam (RESTORIL) 30 MG capsule TAKE 1 CAPSULE BY MOUTH AT BEDTIME 09/29/22   Samuel Bouche, NP    Family History Family History  Problem Relation Age of Onset   Hyperlipidemia Mother    Stroke Mother    Blindness Mother    Heart attack Mother    Depression Mother    Heart attack Father 43   Hypertension Father    Depression Father    Hyperlipidemia Father    Heart disease Maternal Grandmother    Stroke Maternal Grandmother    Stroke Maternal Grandfather    Heart disease Paternal Grandmother    Colon cancer Neg Hx     Social History Social History   Tobacco Use   Smoking status: Former    Packs/day: .5    Types: Cigarettes    Quit date: 2018    Years since quitting: 6.2   Smokeless tobacco: Never   Tobacco comments:    1/2 pack or less  Vaping Use   Vaping Use: Never used  Substance Use Topics   Alcohol use: No    Alcohol/week: 0.0 standard drinks of alcohol   Drug use: No     Allergies   Restasis [cyclosporine] and Other   Review of Systems Review of Systems  Neurological:  Positive for headaches.  As per HPI   Physical Exam Triage Vital Signs ED Triage Vitals  Enc Vitals Group     BP 09/29/22 1425 115/79     Pulse Rate 09/29/22 1425 67     Resp 09/29/22 1425 17     Temp 09/29/22 1425 98.6 F (37 C)     Temp Source 09/29/22 1425 Oral     SpO2 09/29/22 1425 99 %     Weight 09/29/22 1427 75 lb (34 kg)     Height --      Head Circumference --      Peak Flow --      Pain Score 09/29/22 1424 8     Pain Loc --      Pain Edu? --      Excl. in [place]? --    No data found.  Updated Vital Signs BP 115/79 (BP Location: Right Arm)   Pulse 67   Temp 98.6 F (37 C) (Oral)   Resp 17   Wt 75 lb (34 kg)   LMP 10/27/2010   SpO2 99%   BMI 14.65 kg/m   Visual Acuity Right Eye Distance: 20/20 Left Eye  Distance: 20/15 Bilateral Distance: 20/15  Right Eye Near:   Left Eye Near:    Bilateral Near:     Physical Exam Vitals and nursing note reviewed.  Constitutional:  General: She is not in acute distress.    Appearance: She is well-developed. She is not ill-appearing or toxic-appearing.     Comments: Cachectic, frail  HENT:     Head: Normocephalic and atraumatic.     Right Ear: Tympanic membrane, ear canal and external ear normal.     Left Ear: Tympanic membrane, ear canal and external ear normal.     Nose: Nose normal.     Mouth/Throat:     Mouth: Mucous membranes are moist.     Pharynx: Oropharynx is clear. No oropharyngeal exudate or posterior oropharyngeal erythema.  Eyes:     General: No visual field deficit or scleral icterus.    Extraocular Movements: Extraocular movements intact.     Right eye: Normal extraocular motion and no nystagmus.     Left eye: Normal extraocular motion and no nystagmus.     Conjunctiva/sclera: Conjunctivae normal.     Pupils: Pupils are equal, round, and reactive to light. Pupils are equal.     Right eye: Pupil is round and reactive.     Left eye: Pupil is round and reactive.  Neck:     Meningeal: Brudzinski's sign and Kernig's sign absent.  Cardiovascular:     Rate and Rhythm: Normal rate and regular rhythm.     Heart sounds: No murmur heard. Pulmonary:     Effort: Pulmonary effort is normal. No respiratory distress.     Breath sounds: Normal breath sounds.  Abdominal:     Palpations: Abdomen is soft.     Tenderness: There is no abdominal tenderness.  Musculoskeletal:        General: No swelling.     Cervical back: Normal range of motion and neck supple. No rigidity or tenderness.  Lymphadenopathy:     Cervical: No cervical adenopathy.  Skin:    General: Skin is warm and dry.     Capillary Refill: Capillary refill takes less than 2 seconds.  Neurological:     General: No focal deficit present.     Mental Status: She is alert and  oriented to person, place, and time. Mental status is at baseline.     Cranial Nerves: Cranial nerves 2-12 are intact. No cranial nerve deficit, dysarthria or facial asymmetry.     Sensory: Sensation is intact. No sensory deficit.     Motor: Motor function is intact. No weakness, tremor, atrophy, abnormal muscle tone or pronator drift.     Coordination: Coordination is intact. Romberg sign negative. Coordination normal. Finger-Nose-Finger Test normal.     Gait: Gait is intact. Gait normal.     Deep Tendon Reflexes: Reflexes are normal and symmetric. Reflexes normal.     Comments: Negative romberg and pronator drift  Psychiatric:        Mood and Affect: Mood normal.        Speech: Speech normal.        Behavior: Behavior normal.      UC Treatments / Results  Labs (all labs ordered are listed, but only abnormal results are displayed) Labs Reviewed  SEDIMENTATION RATE  COMPREHENSIVE METABOLIC PANEL    EKG   Radiology No results found.  Procedures Procedures (including critical care time)  Medications Ordered in UC Medications - No data to display  Initial Impression / Assessment and Plan / UC Course  I have reviewed the triage vital signs and the nursing notes.  Pertinent labs & imaging results that were available during my care of the patient were reviewed by me and  considered in my medical decision making (see chart for details).     L temporal/ occipital headache -patient with history of recurrent migraines.  She has had numerous workups for similar symptoms in the past.  Reviewing an MRI dating January 2015, patient had the same symptoms at that time.  MRI was normal other than an incidental pineal gland cyst.  In office today, patient has an unremarkable neurological exam.  Her vital signs are stable.  I do not feel any additional imaging would be warranted as she has also had 2 negative CT scans of her head in the past.  I did offer her her typical injections for her  migraines that have been effective in the past, patient is refusing any injections as she states it causes irritation to her gastrointestinal tract.  Instead she is requesting p.o. medications.  Will therefore try ODT rizatriptan for suspected ocular migraine.  ESR obtained to ensure patient is not developing temporal arteritis, although low index of suspicion given age and normal vision in office today. ER for any new or worsening symptoms. Photophobia both eyes - this has been occurring for two weeks. Normal eye exam in office today. Encouraged pt to follow up with ophthalmology if sx continue. Pt with negative meningeal signs. Normal visual acuity in office. Hyponatremia - 132 on last labs in Sept. Will recheck today. Doubt this is of clinical significance.   Final Clinical Impressions(s) / UC Diagnoses   Final diagnoses:  Left temporal headache  Photophobia of both eyes  Hyponatremia     Discharge Instructions      We drew labs today to assess for possible causes of your headache. Your visual acuity is normal. Please try the rizatriptan to see if this helps with the suspected ocular migraine. You will place this medication under your tongue for it to dissolve. Please do not take more than 1 tab in 24 hours. If your symptoms persist or worsen, please head to the emergency room.     ED Prescriptions     Medication Sig Dispense Auth. Provider   rizatriptan (MAXALT-MLT) 10 MG disintegrating tablet Take 1 tablet (10 mg total) by mouth as needed for migraine. 10 tablet Cloee Dunwoody L, [place]      PDMP not reviewed this encounter.   Chaney Malling, [place] 09/29/22 2004

## 2022-09-29 NOTE — Discharge Instructions (Signed)
We drew labs today to assess for possible causes of your headache. Your visual acuity is normal. Please try the rizatriptan to see if this helps with the suspected ocular migraine. You will place this medication under your tongue for it to dissolve. Please do not take more than 1 tab in 24 hours. If your symptoms persist or worsen, please head to the emergency room.

## 2022-09-30 LAB — COMPREHENSIVE METABOLIC PANEL WITH GFR
ALT: 41 IU/L — ABNORMAL HIGH (ref 0–32)
AST: 34 IU/L (ref 0–40)
Albumin/Globulin Ratio: 2.1 (ref 1.2–2.2)
Albumin: 4 g/dL (ref 3.8–4.9)
Alkaline Phosphatase: 124 IU/L — ABNORMAL HIGH (ref 44–121)
BUN/Creatinine Ratio: 8 — ABNORMAL LOW (ref 9–23)
BUN: 6 mg/dL (ref 6–24)
Bilirubin Total: 0.4 mg/dL (ref 0.0–1.2)
CO2: 25 mmol/L (ref 20–29)
Calcium: 9 mg/dL (ref 8.7–10.2)
Chloride: 98 mmol/L (ref 96–106)
Creatinine, Ser: 0.76 mg/dL (ref 0.57–1.00)
Globulin, Total: 1.9 g/dL (ref 1.5–4.5)
Glucose: 89 mg/dL (ref 70–99)
Potassium: 4.1 mmol/L (ref 3.5–5.2)
Sodium: 137 mmol/L (ref 134–144)
Total Protein: 5.9 g/dL — ABNORMAL LOW (ref 6.0–8.5)
eGFR: 92 mL/min/1.73 (ref >59)

## 2022-09-30 LAB — SEDIMENTATION RATE: Sed Rate: 2 mm/hr (ref 0–40)

## 2022-10-02 ENCOUNTER — Encounter: Payer: Self-pay | Admitting: Medical-Surgical

## 2022-10-03 ENCOUNTER — Encounter: Payer: Self-pay | Admitting: Medical-Surgical

## 2022-10-04 MED ORDER — TEMAZEPAM 30 MG PO CAPS: 30.0000 mg | ORAL_CAPSULE | Freq: Every evening | ORAL | 0 refills | Status: DC | PRN

## 2022-10-10 ENCOUNTER — Ambulatory Visit (INDEPENDENT_AMBULATORY_CARE_PROVIDER_SITE_OTHER): Payer: 59 | Admitting: Medical-Surgical

## 2022-10-10 ENCOUNTER — Encounter: Payer: Self-pay | Admitting: Medical-Surgical

## 2022-10-10 VITALS — BP 99/62 | HR 58 | Resp 20 | Ht 60.0 in | Wt 75.2 lb

## 2022-10-10 DIAGNOSIS — E538 Deficiency of other specified B group vitamins: Secondary | ICD-10-CM | POA: Diagnosis not present

## 2022-10-10 DIAGNOSIS — R200 Anesthesia of skin: Secondary | ICD-10-CM | POA: Diagnosis not present

## 2022-10-10 DIAGNOSIS — F5101 Primary insomnia: Secondary | ICD-10-CM

## 2022-10-10 DIAGNOSIS — D509 Iron deficiency anemia, unspecified: Secondary | ICD-10-CM

## 2022-10-10 DIAGNOSIS — R636 Underweight: Secondary | ICD-10-CM

## 2022-10-10 DIAGNOSIS — R7989 Other specified abnormal findings of blood chemistry: Secondary | ICD-10-CM | POA: Diagnosis not present

## 2022-10-10 DIAGNOSIS — R202 Paresthesia of skin: Secondary | ICD-10-CM | POA: Diagnosis not present

## 2022-10-10 MED ORDER — TEMAZEPAM 30 MG PO CAPS: 30.0000 mg | ORAL_CAPSULE | Freq: Every evening | ORAL | 1 refills | Status: DC | PRN

## 2022-10-10 NOTE — Progress Notes (Signed)
Established Patient Office Visit  Subjective   Patient ID: Emma Palmer, female   DOB: [birth year] Age: 57 y.o. MRN: [id number]   Chief Complaint  Patient presents with   Follow-up   HPI Pleasant 57 year old female presenting today for the following:   Insomnia: has been struggling with her pharmacy to get her Temazepam each month. They are only allowing her to pick up 15 at a time despite her paying out of pocket for the remaining 15. Plans to switch her pharmacy back to [facility] since it has not been easy to deal with [facility]. The medication works well for her when she takes it and she has found herself unable to sleep without it. No excessive grogginess or concerning side effects.   Notes that she has been taking Magnesium and thiamine to help with recent lower extremity paresthesias. She was seen in the urgent care on two occasions but no cause for her symptoms could be found. She did some research and decided to start the two supplements and feels that they are helping.   Does have complaints of tailbone pain that has been chronic in nature. Wonders if there is a deformity that causes this.    Objective:    Vitals:   10/10/22 0920  BP: 99/62  Pulse: (!) 58  Resp: 20  Height: 5' (1.524 m)  Weight: 75 lb 3.2 oz (34.1 kg)  SpO2: 100%  BMI (Calculated): 14.69    Physical Exam Vitals reviewed.  Constitutional:      General: She is not in acute distress.    Appearance: Normal appearance. She is underweight. She is not ill-appearing.  HENT:     Head: Normocephalic and atraumatic.  Cardiovascular:     Rate and Rhythm: Normal rate and regular rhythm.     Pulses: Normal pulses.     Heart sounds: Normal heart sounds.  Pulmonary:     Effort: Pulmonary effort is normal. No respiratory distress.     Breath sounds: Normal breath sounds. No wheezing, rhonchi or rales.  Skin:    General: Skin is warm and dry.  Neurological:     Mental Status: She is alert and oriented to person,  place, and time.  Psychiatric:        Mood and Affect: Mood normal.        Behavior: Behavior normal.        Thought Content: Thought content normal.        Judgment: Judgment normal.   No results found for this or any previous visit (from the past 24 hour(s)).     The 10-year ASCVD risk score (Arnett DK, et al., 2019) is: 1.5%   Values used to calculate the score:     Age: 61 years     Sex: Female     Is Non-Hispanic African American: No     Diabetic: No     Tobacco smoker: No     Systolic Blood Pressure: 99 mmHg     Is BP treated: No     HDL Cholesterol: 49 mg/dL     Total Cholesterol: 209 mg/dL   Assessment & Plan:   1. Vitamin B12 deficiency Rechecking vitamin B12 after stopping her oral supplementation.  - Vitamin B12  2. Primary insomnia Discussed use of Temazepam. Ultimately, would like to get her down to a lower dose given her weight/BMI but she has been on this for quite a while and is now dependent. Refilling Temazepam at patient request for a 90  day supply that she plans to pay out of pocket for. GoodRx coupons printed and given to her to take to her pharmacist.   3. Iron deficiency anemia, unspecified iron deficiency anemia type Checking iron and blood counts.  - Iron, TIBC and Ferritin Panel - CBC with Differential/Platelet  4. Numbness and tingling of lower extremity CMP was checked with normal findings. Adding evaluation for TSH, B1, and vitamin D.  - VITAMIN D 25 Hydroxy (Vit-D Deficiency, Fractures) - TSH - Vitamin B1  5. Underweight Checking TSH. Advised patient that the likely cause for her tailbone pain is the lack of adequate musculature and fat to pad the area. No obvious deformity on exam. Strongly recommend working to increase intake with a goal for higher protein to help increase muscle mass.  - TSH  Return in about 6 months (around 04/12/2023) for chronic disease follow up.  ___________________________________________ Clearnce Sorrel, DNP,  APRN, FNP-BC [facility] [place]

## 2022-10-11 ENCOUNTER — Encounter: Payer: Self-pay | Admitting: Medical-Surgical

## 2022-10-11 NOTE — Telephone Encounter (Signed)
Patient sent a different MyChart message about labs.

## 2022-10-13 LAB — CBC WITH DIFFERENTIAL/PLATELET
Absolute Monocytes: 372 cells/uL (ref 200–950)
Basophils Absolute: 32 cells/uL (ref 0–200)
Basophils Relative: 0.8 %
Eosinophils Absolute: 20 cells/uL (ref 15–500)
Eosinophils Relative: 0.5 %
HCT: 41.6 % (ref 35.0–45.0)
Hemoglobin: 14.2 g/dL (ref 11.7–15.5)
Lymphs Abs: 1884 cells/uL (ref 850–3900)
MCH: 31.9 pg (ref 27.0–33.0)
MCHC: 34.1 g/dL (ref 32.0–36.0)
MCV: 93.5 fL (ref 80.0–100.0)
MPV: 8.6 fL (ref 7.5–12.5)
Monocytes Relative: 9.3 %
Neutro Abs: 1692 cells/uL (ref 1500–7800)
Neutrophils Relative %: 42.3 %
Platelets: 480 10*3/uL — ABNORMAL HIGH (ref 140–400)
RBC: 4.45 10*6/uL (ref 3.80–5.10)
RDW: 12.7 % (ref 11.0–15.0)
Total Lymphocyte: 47.1 %
WBC: 4 10*3/uL (ref 3.8–10.8)

## 2022-10-13 LAB — IRON,TIBC AND FERRITIN PANEL
%SAT: 20 % (calc) (ref 16–45)
Ferritin: 26 ng/mL (ref 16–232)
Iron: 65 ug/dL (ref 45–160)
TIBC: 326 mcg/dL (calc) (ref 250–450)

## 2022-10-13 LAB — VITAMIN D 25 HYDROXY (VIT D DEFICIENCY, FRACTURES): Vit D, 25-Hydroxy: 60 ng/mL (ref 30–100)

## 2022-10-13 LAB — VITAMIN B1: Vitamin B1 (Thiamine): 41 nmol/L — ABNORMAL HIGH (ref 8–30)

## 2022-10-13 LAB — TSH: TSH: 3.61 mIU/L (ref 0.40–4.50)

## 2022-10-13 LAB — VITAMIN B12: Vitamin B-12: 1616 pg/mL — ABNORMAL HIGH (ref 200–1100)

## 2022-10-13 NOTE — Progress Notes (Signed)
Have her follow up in 3 months or sooner if needed.

## 2022-11-09 ENCOUNTER — Encounter: Payer: Self-pay | Admitting: Medical-Surgical

## 2022-11-16 ENCOUNTER — Encounter: Payer: Self-pay | Admitting: Medical-Surgical

## 2022-11-17 MED ORDER — FLUOXETINE HCL 10 MG PO CAPS: 10.0000 mg | ORAL_CAPSULE | Freq: Every day | ORAL | 3 refills | Status: DC

## 2022-12-15 ENCOUNTER — Ambulatory Visit (INDEPENDENT_AMBULATORY_CARE_PROVIDER_SITE_OTHER): Payer: 59

## 2022-12-15 ENCOUNTER — Ambulatory Visit
Admission: EM | Admit: 2022-12-15 | Discharge: 2022-12-15 | Disposition: A | Discharge status: Home / Self Care | Payer: 59 | Attending: Urgent Care | Admitting: Urgent Care

## 2022-12-15 DIAGNOSIS — R202 Paresthesia of skin: Secondary | ICD-10-CM | POA: Diagnosis not present

## 2022-12-15 DIAGNOSIS — R519 Headache, unspecified: Secondary | ICD-10-CM

## 2022-12-15 DIAGNOSIS — M542 Cervicalgia: Secondary | ICD-10-CM

## 2022-12-15 HISTORY — DX: Malignant (primary) neoplasm, unspecified: C80.1

## 2022-12-15 MED ORDER — DULOXETINE HCL 20 MG PO CPEP: 20.0000 mg | ORAL_CAPSULE | Freq: Every day | ORAL | 1 refills | Status: DC

## 2022-12-15 NOTE — ED Provider Notes (Signed)
[name]    CSN: [id number] Arrival date & time: 12/15/22  0815      History   Chief Complaint Chief Complaint  Patient presents with   Tingling    Anterior neck     HPI Emma Palmer is a 57 y.o. female.   57 year old female presents today with a primary concern of anterior neck tingling for the past 1 month.  She reports it tingles at all times, and does not seem to be positional.  It also is not interfering with her normal activities.  Patient was able to mow her yard yesterday without any issues.  She reports full range of motion of her shoulders and denies any radicular symptoms into her arms.  She does report intermittent and sporadic tingling to her forehead region, but denies any currently in our office.  2 months ago, patient was having extremity tingling in her lower extremities for which she was taking B12 and thiamine with some improvement.  She states however she stopped the supplements 1 week ago due to concerns of "possible toxicity".  She does report history of recurrent migraines in the past for which it appears she is following with her primary.  She did have an MRI in 2015 which was normal other than a small pineal gland cyst.  Regarding her current anterior neck symptoms, she denies any rash or lesions.  She has not taken any medications that have been effective.  She denies thyroid disease.  She denies dysphagia or odynophagia.  She is not having any loss of vision or blurred vision, although she does report vague "vision changes a while back" which is what caused her to start eating more Vitamin A. Pt does report fatigue. She denies gait instability or falling. She denies bowel or bladder incontinence. She denies sharp shooting pains down her back.  Pt was recently started on 10mg  prozac by her PCP, however pt states she was scared to take it and only took 1/2 to 1/4 of the tablet. She then attributed some abnormal vision to taking the pill, and thus stopped  it entirely. She states she did not take it long enough to know if it helped with her depression or anxiety.      Past Medical History:  Diagnosis Date   Anxiety    B12 deficiency    Bulimia    Cancer (HCC)    Depression    GERD (gastroesophageal reflux disease)    Glaucoma    H. pylori infection    + serolgies   Headache(784.0)    Hip dysplasia    left - dr supple   History of Helicobacter pylori infection 10/03/2017   History of pelvic fracture    Hyperlipidemia    Intraocular pressure increase    dr Hazle Quant   Iron deficiency anemia    Nephrolithiasis    hx, dr Aldean Ast   OP (osteoporosis) 10/24/2006   dexa     Patient Active Problem List   Diagnosis Date Noted   Excessive tanning 05/19/2021   History of bulimia 05/19/2021   Irritable bowel syndrome with both constipation and diarrhea 05/19/2021   Underweight 05/19/2021   Migraines 04/12/2018   Severe episode of recurrent major depressive disorder, without psychotic features (HCC) 10/03/2017   Gastric irritation 10/03/2017   Primary insomnia 10/03/2017   Arthralgia 01/06/2017   Iron deficiency anemia 06/07/2016   OAB (overactive bladder) 06/20/2014   GERD (gastroesophageal reflux disease) 04/15/2014   Vitamin B12 deficiency 02/10/2014  Bulimia nervosa 02/10/2014   Anorexia nervosa, restricting type 02/10/2014   ANXIETY STATE, UNSPECIFIED 04/09/2010   SOLITARY BONE CYST 08/31/2007   Hyperlipidemia 11/15/2006   DEPRESSION 11/15/2006   ARTHRITIS, RIGHT HIP 11/15/2006   OSTEOPOROSIS 11/15/2006    Past Surgical History:  Procedure Laterality Date   ESOPHAGOGASTRODUODENOSCOPY  05-06-05   dr Leone Payor -normal including duodenal bxs   LITHOTRIPSY     stent inserted    OB History   No obstetric history on file.      Home Medications    Prior to Admission medications   Medication Sig Start Date End Date Taking? Authorizing Provider  DULoxetine (CYMBALTA) 20 MG capsule Take 1 capsule (20 mg total) by  mouth daily. 12/15/22  Yes Aleana Fifita L, PA  B Complex-C (B-COMPLEX WITH VITAMIN C) tablet Take 1 tablet by mouth daily.    [provider]  Ensure Plus (ENSURE PLUS) LIQD Take 237 mLs by mouth daily.    [provider]  FLUoxetine (PROZAC) 10 MG capsule Take 1 capsule (10 mg total) by mouth daily. 11/17/22   Christen Butter, NP  Folic Acid (FOLATE PO) Take 800 mcg by mouth 1 day or 1 dose.    [provider]  Magnesium 400 MG TABS Take by mouth.    [provider]  temazepam (RESTORIL) 30 MG capsule Take 1 capsule (30 mg total) by mouth at bedtime as needed for sleep. TAKE 1 CAPSULE BY MOUTH AT BEDTIME 10/10/22   Christen Butter, NP  thiamine (VITAMIN B1) 100 MG tablet Take 100 mg by mouth daily.    [provider]  thiamine 50 MG tablet Take 50 mg by mouth daily.    [provider]    Family History Family History  Problem Relation Age of Onset   Hyperlipidemia Mother    Stroke Mother    Blindness Mother    Heart attack Mother    Depression Mother    Heart attack Father 48   Hypertension Father    Depression Father    Hyperlipidemia Father    Heart disease Maternal Grandmother    Stroke Maternal Grandmother    Stroke Maternal Grandfather    Heart disease Paternal Grandmother    Colon cancer Neg Hx     Social History Social History   Tobacco Use   Smoking status: Former    Packs/day: .5    Types: Cigarettes    Quit date: 2018    Years since quitting: 6.4   Smokeless tobacco: Never   Tobacco comments:    1/2 pack or less  Vaping Use   Vaping Use: Never used  Substance Use Topics   Alcohol use: No    Alcohol/week: 0.0 standard drinks of alcohol   Drug use: No     Allergies   Restasis [cyclosporine] and Other   Review of Systems Review of Systems As per HPI  Physical Exam Triage Vital Signs ED Triage Vitals  Enc Vitals Group     BP 12/15/22 0824 114/84     Pulse Rate 12/15/22 0824 65     Resp 12/15/22 0824 16      Temp 12/15/22 0824 (!) 97.4 F (36.3 C)     Temp Source 12/15/22 0824 Oral     SpO2 12/15/22 0824 98 %     Weight --      Height --      Head Circumference --      Peak Flow --  Pain Score 12/15/22 0823 3     Pain Loc --      Pain Edu? --      Excl. in GC? --    No data found.  Updated Vital Signs BP 114/84   Pulse 65   Temp (!) 97.4 F (36.3 C) (Oral)   Resp 16   LMP 10/27/2010   SpO2 98%   Visual Acuity Right Eye Distance:   Left Eye Distance:   Bilateral Distance:    Right Eye Near:   Left Eye Near:    Bilateral Near:     Physical Exam Vitals and nursing note reviewed.  Constitutional:      General: She is not in acute distress.    Appearance: She is well-developed. She is obese. She is not ill-appearing or toxic-appearing.     Comments: Thin, cachectic appearance  HENT:     Head: Normocephalic and atraumatic.     Right Ear: Tympanic membrane, ear canal and external ear normal.     Left Ear: Tympanic membrane, ear canal and external ear normal.     Nose: Nose normal.     Mouth/Throat:     Mouth: Mucous membranes are moist.     Pharynx: Oropharynx is clear. No oropharyngeal exudate or posterior oropharyngeal erythema.  Eyes:     General: No scleral icterus.       Right eye: No discharge.        Left eye: No discharge.     Extraocular Movements: Extraocular movements intact.     Conjunctiva/sclera: Conjunctivae normal.     Pupils: Pupils are equal, round, and reactive to light.  Cardiovascular:     Rate and Rhythm: Normal rate and regular rhythm.     Heart sounds: No murmur heard. Pulmonary:     Effort: Pulmonary effort is normal. No respiratory distress.     Breath sounds: Normal breath sounds. No stridor. No wheezing or rhonchi.  Abdominal:     Palpations: Abdomen is soft.     Tenderness: There is no abdominal tenderness.  Musculoskeletal:        General: Tenderness (some reproducible tenderness to R rhomboid) present. No swelling or signs  of injury. Normal range of motion.     Cervical back: Normal range of motion and neck supple. No rigidity or tenderness.     Right lower leg: No edema.     Left lower leg: No edema.     Comments: FROM of C-spine and B shoulders without reproducible symptoms  Lymphadenopathy:     Cervical: No cervical adenopathy.  Skin:    General: Skin is warm and dry.     Capillary Refill: Capillary refill takes less than 2 seconds.     Coloration: Skin is not jaundiced.     Findings: No bruising, erythema or rash.  Neurological:     General: No focal deficit present.     Mental Status: She is alert and oriented to person, place, and time. Mental status is at baseline.     Cranial Nerves: Cranial nerves 2-12 are intact. No cranial nerve deficit or facial asymmetry.     Sensory: Sensation is intact.     Motor: Motor function is intact. No weakness, tremor, atrophy or pronator drift.     Coordination: Coordination is intact. Romberg sign negative. Coordination normal. Finger-Nose-Finger Test normal.     Gait: Gait is intact. Gait normal.     Deep Tendon Reflexes: Reflexes are normal and symmetric.  Psychiatric:  Mood and Affect: Mood normal.      UC Treatments / Results  Labs (all labs ordered are listed, but only abnormal results are displayed) Labs Reviewed - No data to display  EKG   Radiology DG Cervical Spine Complete  Result Date: 12/15/2022 CLINICAL DATA:  Anterior neck tingling EXAM: CERVICAL SPINE - COMPLETE 4+ VIEW COMPARISON:  None Available. FINDINGS: The cervical spine is imaged through the C7 vertebral body in the lateral projection. The imaged vertebral body heights are preserved, without evidence of acute injury. Alignment is normal. The disc spaces are preserved. There is mild facet arthropathy at C3-C4 with mild right neural foraminal narrowing. The other neural foramina appear patent. The prevertebral soft tissues are unremarkable. The imaged lung apices are clear.  IMPRESSION: No acute finding in the cervical spine. Mild facet arthropathy at C3-C4. Electronically Signed   By: Lesia Hausen M.D.   On: 12/15/2022 09:18    Procedures Procedures (including critical care time)  Medications Ordered in UC Medications - No data to display  Initial Impression / Assessment and Plan / UC Course  I have reviewed the triage vital signs and the nursing notes.  Pertinent labs & imaging results that were available during my care of the patient were reviewed by me and considered in my medical decision making (see chart for details).     Paresthesias - C-spine xray reveals no particular abnormality to explain pts sx to anterior neck. Neuro exam normal today. Labs from PCP in March also reviewed. Given her headaches and vision changes, MS needs to be on the differentials. Will defer further workup to PCP, or possibly neuro. Pt has taken cymbalta in the past she states for depression, so will try this again but will start at 20mg  to see if this helps with her neuropathic pain.  Anterior neck pain - pt with FROM in office. No abnormalities noted on physical exam and xray results benign. No signs of dental infection, no c/o sore throat. No tenderness to palpation of thyroid gland. Tx as above Headaches - none in office today. Pt was requesting I order an MRI. Discussed with pt this would need to be further discussed with PCP. MRI in 2015 showed a small pineal gland cyst, otherwise negative. No abnormal neuro findings on exam today and pt without headache in office today.    Final Clinical Impressions(s) / UC Diagnoses   Final diagnoses:  Paresthesias  Anterior neck pain  Generalized headaches     Discharge Instructions      Please start taking 1 capsule of the duloxetine daily. This should help with the tingling sensation in your neck.  Please call your PCP and see if you can get a follow-up appointment in 2 weeks. You may continue your B12 and thiamine if it was  helping with your symptoms.  If you develop any new or worsening symptoms abruptly, please return to clinic or head to the emergency room.     ED Prescriptions     Medication Sig Dispense Auth. Provider   DULoxetine (CYMBALTA) 20 MG capsule Take 1 capsule (20 mg total) by mouth daily. 30 capsule Brad Lieurance L, [place]      PDMP not reviewed this encounter.   Maretta Bees, [place] 12/15/22 1205

## 2022-12-15 NOTE — ED Triage Notes (Addendum)
Pt presents to uc with co of skin tingling on anterior neck for about 3 weeks. Pt also reports right  and left shoulder pain she is unsure if this is related. Denies any rashes. Pt reports this has occurred before in other areas of her body but mainly the neck.

## 2022-12-15 NOTE — Discharge Instructions (Signed)
Please start taking 1 capsule of the duloxetine daily. This should help with the tingling sensation in your neck.  Please call your PCP and see if you can get a follow-up appointment in 2 weeks. You may continue your B12 and thiamine if it was helping with your symptoms.  If you develop any new or worsening symptoms abruptly, please return to clinic or head to the emergency room.

## 2023-01-13 ENCOUNTER — Encounter: Payer: Self-pay | Admitting: Medical-Surgical

## 2023-01-13 ENCOUNTER — Ambulatory Visit (INDEPENDENT_AMBULATORY_CARE_PROVIDER_SITE_OTHER): Payer: 59 | Admitting: Medical-Surgical

## 2023-01-13 VITALS — BP 85/59 | HR 60 | Resp 20 | Ht 60.0 in | Wt 73.9 lb

## 2023-01-13 DIAGNOSIS — E538 Deficiency of other specified B group vitamins: Secondary | ICD-10-CM | POA: Diagnosis not present

## 2023-01-13 DIAGNOSIS — M6259 Muscle wasting and atrophy, not elsewhere classified, multiple sites: Secondary | ICD-10-CM

## 2023-01-13 DIAGNOSIS — H539 Unspecified visual disturbance: Secondary | ICD-10-CM | POA: Diagnosis not present

## 2023-01-13 DIAGNOSIS — Z9189 Other specified personal risk factors, not elsewhere classified: Secondary | ICD-10-CM

## 2023-01-13 DIAGNOSIS — R519 Headache, unspecified: Secondary | ICD-10-CM | POA: Diagnosis not present

## 2023-01-13 DIAGNOSIS — F50019 Anorexia nervosa, restricting type, unspecified: Secondary | ICD-10-CM

## 2023-01-13 DIAGNOSIS — F332 Major depressive disorder, recurrent severe without psychotic features: Secondary | ICD-10-CM

## 2023-01-13 DIAGNOSIS — F5101 Primary insomnia: Secondary | ICD-10-CM

## 2023-01-13 DIAGNOSIS — F5001 Anorexia nervosa, restricting type: Secondary | ICD-10-CM

## 2023-01-13 DIAGNOSIS — R636 Underweight: Secondary | ICD-10-CM

## 2023-01-13 DIAGNOSIS — M7918 Myalgia, other site: Secondary | ICD-10-CM

## 2023-01-13 NOTE — Progress Notes (Signed)
Established patient visit  History, exam, impression, and plan:  1. Primary insomnia Pleasant 57 year old female presenting today with a history of chronic insomnia.  She is currently using temazepam 30 mg nightly, tolerating well without side effects.  Does not leave her excessively groggy and continues to work well for her.  Aware that this is a controlled substance and should be used at the lowest effective dose for the least amount of time.  Reports that if she does not take the medication she has significant difficulty sleeping.  Plan to continue temazepam 30 mg nightly.  2. Severe episode of recurrent major depressive disorder, without psychotic features (HCC) Previously discussed starting fluoxetine however it looks like she is not taking this at this point.  She says she did take it however it increased her sensitivity to the sun and since she has an addiction to excessive tanning, she stopped the medication and plans to try again in the fall when it is not so sending out.  Not currently on any medications for mood management and not currently doing any counseling.  We did briefly discuss the importance of treating mental health concerns however she is not interested in starting anything else today.  3. Vitamin B12 deficiency History of vitamin B12 deficiency however she has stopped vitamin B supplementation as her vitamin B levels have been extremely high.  Last check in March showed levels were still elevated.  Continue to avoid vitamin D B12 supplementation.  4. Anorexia nervosa, restricting type 5. Underweight Continues to have issues with poor caloric intake and protein deficiency.  She is extremely underweight and aware of the importance of increasing her daily intake.  Has had many interventional efforts in the past and is not interested in pursuing counseling or psychiatry referral for eating disorder.  I have discussed that her lower extremity edema is likely related to her  nutritional deficiencies and that her complaints noted below are directly related to muscle atrophy/muscle wasting, debility, etc.  At this point, her overall health is extremely poor and I have advised that she make a concerted effort to increase her intake.  Would like for her to gain no less than 2 pounds between now and her next visit but more than that would be extremely beneficial.  In the meantime, referring to physical therapy as noted below. - Ambulatory referral to Physical Therapy  6. Atrophy of muscle of multiple sites 7. Myalgia, multiple sites Continues to have multiple complaints of myalgias, arthralgias, and numbness/tingling that affects a majority of her body.  She is interested in getting a full body MRI scan to see what is going on.  I have explained to her that there are multiple factors to her current physical concerns.  As noted above, nutritional deficiency is a huge contributor.  She is also fairly sedentary despite staying active with various activities at home.  I did mention physical therapy which she feels is a waste of time and money however after discussion, she is agreeable to at least trying a couple of visits with them. - Ambulatory referral to Physical Therapy  8. Excessive tanning Continues to tan excessively.  She is aware that this is an addiction and puts her at extremely high risk for skin cancer however reports that it feels so good and relaxing when she is warm and in the sun.  I did mention other options including heated blankets, warm baths, etc. however she is not open to switching to  these other things.  I continue to strongly advise cessation of excessive tanning.  We did do a brief skin check today with no concerning lesions.  9. Worsening headaches 10.  Vision changes Does have a chronic headache history however notes that her headaches have become more frequent and have been associated with recent vision changes.  Also reports the severity of her  frequent headaches has worsened.  Patient requesting an MRI of the brain to evaluate for any concerns.  Last MRI of the brain 8 years ago.  Not following with neurology for headache management.  We did briefly discuss that her poor nutritional status and stress levels could be contributing to her symptoms.  Despite this, she would like to proceed with imaging.  MRI of the brain with and without contrast ordered for further evaluation.  Procedures performed this visit: None.  Return in about 2 months (around 03/15/2023) for MSK complaint follow up.  __________________________________ Thayer Ohm, DNP, APRN, FNP-BC Primary Care and Sports Medicine [facility]

## 2023-01-21 ENCOUNTER — Ambulatory Visit
Admission: EM | Admit: 2023-01-21 | Discharge: 2023-01-21 | Disposition: A | Discharge status: Home / Self Care | Payer: 59 | Attending: Family Medicine | Admitting: Family Medicine

## 2023-01-21 ENCOUNTER — Ambulatory Visit (INDEPENDENT_AMBULATORY_CARE_PROVIDER_SITE_OTHER): Payer: 59

## 2023-01-21 ENCOUNTER — Encounter: Payer: Self-pay | Admitting: Emergency Medicine

## 2023-01-21 DIAGNOSIS — S2222XA Fracture of body of sternum, initial encounter for closed fracture: Secondary | ICD-10-CM | POA: Diagnosis not present

## 2023-01-21 DIAGNOSIS — R0789 Other chest pain: Secondary | ICD-10-CM | POA: Diagnosis not present

## 2023-01-21 DIAGNOSIS — M8000XA Age-related osteoporosis with current pathological fracture, unspecified site, initial encounter for fracture: Secondary | ICD-10-CM

## 2023-01-21 DIAGNOSIS — R071 Chest pain on breathing: Secondary | ICD-10-CM | POA: Diagnosis not present

## 2023-01-21 DIAGNOSIS — S2220XA Unspecified fracture of sternum, initial encounter for closed fracture: Secondary | ICD-10-CM | POA: Diagnosis not present

## 2023-01-21 MED ORDER — HYDROCODONE-ACETAMINOPHEN 7.5-325 MG PO TABS: 1.0000 | ORAL_TABLET | Freq: Four times a day (QID) | ORAL | 0 refills | Status: DC | PRN

## 2023-01-21 MED ORDER — CYCLOBENZAPRINE HCL 5 MG PO TABS: 5.0000 mg | ORAL_TABLET | Freq: Every day | ORAL | 0 refills | Status: DC

## 2023-01-21 NOTE — ED Notes (Signed)
Pt educated on medications (frequency, dose, s/e, etc.), avoiding PNA, and self care. pt verbalized understanding.

## 2023-01-21 NOTE — ED Triage Notes (Signed)
Patient states that she caught herself from falling this morning.  Now patient is having centralized chest pain, difficulty moving her arms around and driving.  Patient denies any OTC pain meds.

## 2023-01-21 NOTE — Discharge Instructions (Signed)
Reduce your activity Take over the counter tylenol or ibuprofen for moderate pain Take the hydrocodone as needed severe apin May take flexeril at bedtime Use a pillow for support Make an appointment to see Emma Butter DNP next week

## 2023-01-21 NOTE — ED Provider Notes (Signed)
[name]    CSN: [id number] Arrival date & time: 01/21/23  1107      History   Chief Complaint Chief Complaint  Patient presents with   Chest Pain    HPI Emma Palmer is a 57 y.o. female.   HPI  Known to me from prior visits In poor health and poor physical condition from hears of malnourishment Larey Seat today outside a shop and landed in a squatting position with hands on the sidewalk.  Felt pain in central chest right away.  As day had gone on it is worse.  She said as she was putting away groceries and one item fell from the counter.  She reached quickly for it and felt excruciating pain in anterior chest.  Now " can hardly breathe". Known osteoporosis   Past Medical History:  Diagnosis Date   Anxiety    B12 deficiency    Bulimia    Cancer (HCC)    Depression    GERD (gastroesophageal reflux disease)    Glaucoma    H. pylori infection    + serolgies   Headache(784.0)    Hip dysplasia    left - dr supple   History of Helicobacter pylori infection 10/03/2017   History of pelvic fracture    Hyperlipidemia    Intraocular pressure increase    dr Hazle Quant   Iron deficiency anemia    Nephrolithiasis    hx, dr Aldean Ast   OP (osteoporosis) 10/24/2006   dexa     Patient Active Problem List   Diagnosis Date Noted   Excessive tanning 05/19/2021   Irritable bowel syndrome with both constipation and diarrhea 05/19/2021   Underweight 05/19/2021   Migraines 04/12/2018   Gastric irritation 10/03/2017   Primary insomnia 10/03/2017   Arthralgia 01/06/2017   Iron deficiency anemia 06/07/2016   OAB (overactive bladder) 06/20/2014   GERD (gastroesophageal reflux disease) 04/15/2014   Vitamin B12 deficiency 02/10/2014   Bulimia nervosa 02/10/2014   Anorexia nervosa, restricting type 02/10/2014   ANXIETY STATE, UNSPECIFIED 04/09/2010   SOLITARY BONE CYST 08/31/2007   Hyperlipidemia 11/15/2006   ARTHRITIS, RIGHT HIP 11/15/2006   OSTEOPOROSIS 11/15/2006    Severe episode of recurrent major depressive disorder, without psychotic features (HCC) 11/15/2006    Past Surgical History:  Procedure Laterality Date   ESOPHAGOGASTRODUODENOSCOPY  05-06-05   dr Leone Payor -normal including duodenal bxs   LITHOTRIPSY     stent inserted    OB History   No obstetric history on file.      Home Medications    Prior to Admission medications   Medication Sig Start Date End Date Taking? Authorizing Provider  B Complex-C (B-COMPLEX WITH VITAMIN C) tablet Take 1 tablet by mouth daily.   Yes [provider]  cyclobenzaprine (FLEXERIL) 5 MG tablet Take 1 tablet (5 mg total) by mouth at bedtime. 01/21/23  Yes Eustace Moore, MD  Ensure Plus (ENSURE PLUS) LIQD Take 237 mLs by mouth daily.   Yes [provider]  FLUoxetine (PROZAC) 10 MG capsule Take 1 capsule (10 mg total) by mouth daily. 11/17/22  Yes Christen Butter, NP  Folic Acid (FOLATE PO) Take 800 mcg by mouth 1 day or 1 dose.   Yes [provider]  HYDROcodone-acetaminophen (NORCO) 7.5-325 MG tablet Take 1 tablet by mouth every 6 (six) hours as needed for moderate pain. 01/21/23  Yes Eustace Moore, MD  Magnesium 400 MG TABS Take by mouth.   Yes [provider]  temazepam (  RESTORIL) 30 MG capsule Take 1 capsule (30 mg total) by mouth at bedtime as needed for sleep. TAKE 1 CAPSULE BY MOUTH AT BEDTIME 10/10/22  Yes Christen Butter, NP    Family History Family History  Problem Relation Age of Onset   Hyperlipidemia Mother    Stroke Mother    Blindness Mother    Heart attack Mother    Depression Mother    Heart attack Father 38   Hypertension Father    Depression Father    Hyperlipidemia Father    Heart disease Maternal Grandmother    Stroke Maternal Grandmother    Stroke Maternal Grandfather    Heart disease Paternal Grandmother    Colon cancer Neg Hx     Social History Social History   Tobacco Use   Smoking status: Former    Packs/day: .5    Types:  Cigarettes    Quit date: 2018    Years since quitting: 6.5   Smokeless tobacco: Never   Tobacco comments:    1/2 pack or less  Vaping Use   Vaping Use: Never used  Substance Use Topics   Alcohol use: No    Alcohol/week: 0.0 standard drinks of alcohol   Drug use: No     Allergies   Restasis [cyclosporine] and Other   Review of Systems Review of Systems See HPI  Physical Exam Triage Vital Signs ED Triage Vitals  Enc Vitals Group     BP 01/21/23 1123 110/74     Pulse Rate 01/21/23 1123 64     Resp 01/21/23 1123 16     Temp 01/21/23 1123 98.4 F (36.9 C)     Temp Source 01/21/23 1123 Oral     SpO2 01/21/23 1123 100 %     Weight 01/21/23 1124 75 lb (34 kg)     Height 01/21/23 1124 5' (1.524 m)     Head Circumference --      Peak Flow --      Pain Score 01/21/23 1124 9     Pain Loc --      Pain Edu? --      Excl. in GC? --    No data found.  Updated Vital Signs BP 110/74 (BP Location: Right Arm)   Pulse 64   Temp 98.4 F (36.9 C) (Oral)   Resp 16   Ht 5' (1.524 m)   Wt 34 kg   LMP 10/27/2010   SpO2 100%   BMI 14.65 kg/m       Physical Exam Constitutional:      General: She is not in acute distress.    Appearance: She is ill-appearing.     Comments: Chronically ill appearing.  underweight  HENT:     Head: Normocephalic and atraumatic.  Eyes:     Conjunctiva/sclera: Conjunctivae normal.     Pupils: Pupils are equal, round, and reactive to light.  Cardiovascular:     Rate and Rhythm: Normal rate and regular rhythm.     Heart sounds: Normal heart sounds.  Pulmonary:     Effort: Pulmonary effort is normal. No respiratory distress.     Breath sounds: Normal breath sounds.  Chest:     Chest wall: Tenderness present.     Comments: Tenderness mid to lower sternum.  No deformity Abdominal:     General: There is no distension.     Palpations: Abdomen is soft.  Musculoskeletal:        General: Normal range of motion.  Cervical back: Normal range of  motion.  Skin:    General: Skin is warm and dry.  Neurological:     General: No focal deficit present.     Mental Status: She is alert.      UC Treatments / Results  Labs (all labs ordered are listed, but only abnormal results are displayed) Labs Reviewed - No data to display  EKG   Radiology DG Chest 2 View  Result Date: 01/21/2023 CLINICAL DATA:  Fall.  Midsternal chest pain. EXAM: CHEST - 2 VIEW COMPARISON:  07/14/2008 FINDINGS: The lungs are clear without focal pneumonia, edema, pneumothorax or pleural effusion. The cardiopericardial silhouette is within normal limits for size. Bones are diffusely demineralized. No evidence for thoracic spine fracture. Cortical defects in the sternum are compatible with nondisplaced fracture. IMPRESSION: Nondisplaced sternal fracture. Otherwise no acute cardiopulmonary findings. Electronically Signed   By: Kennith Center M.D.   On: 01/21/2023 12:22   DG Sternum  Result Date: 01/21/2023 CLINICAL DATA:  Pain EXAM: STERNUM - 2 VIEW COMPARISON:  None Available. FINDINGS: Cortical irregularity of the upper sternum, consistent with nondisplaced fracture. Partially visualized subacute/chronic appearing right-sided rib fractures. IMPRESSION: Nondisplaced fracture of the upper sternum. Electronically Signed   By: Allegra Lai M.D.   On: 01/21/2023 12:15    Procedures Procedures (including critical care time)  Medications Ordered in UC Medications - No data to display  Initial Impression / Assessment and Plan / UC Course  I have reviewed the triage vital signs and the nursing notes.  Pertinent labs & imaging results that were available during my care of the patient were reviewed by me and considered in my medical decision making (see chart for details).     Discussed the importance of cough and deep breath to prevent pneumonia Discussed pain management.  Do not drive on [place].  Med will not be refilled Follow up with PCP Patient requests refill  flexeril.  It helped her with pain with her last fracture Cautioned sedation Final Clinical Impressions(s) / UC Diagnoses   Final diagnoses:  Chest pain on breathing  Closed fracture of body of sternum, initial encounter  Osteoporosis with current pathological fracture, unspecified osteoporosis type, initial encounter     Discharge Instructions      Reduce your activity Take over the counter tylenol or ibuprofen for moderate pain Take the hydrocodone as needed severe apin May take flexeril at bedtime Use a pillow for support Make an appointment to see Christen Butter DNP next week     ED Prescriptions     Medication Sig Dispense Auth. Provider   HYDROcodone-acetaminophen (NORCO) 7.5-325 MG tablet Take 1 tablet by mouth every 6 (six) hours as needed for moderate pain. 15 tablet Eustace Moore, MD   cyclobenzaprine (FLEXERIL) 5 MG tablet Take 1 tablet (5 mg total) by mouth at bedtime. 30 tablet Eustace Moore, MD      I have reviewed the PDMP during this encounter.   Eustace Moore, MD 01/21/23 1249

## 2023-01-23 ENCOUNTER — Ambulatory Visit: Payer: 59 | Admitting: Physical Therapy

## 2023-01-24 ENCOUNTER — Telehealth: Payer: Self-pay | Admitting: Medical-Surgical

## 2023-01-24 ENCOUNTER — Telehealth: Payer: Self-pay | Admitting: Emergency Medicine

## 2023-01-24 NOTE — Telephone Encounter (Signed)
Patient was seen in urgent care Birder Robson was the provider she had an accident and fractured breast bone she is requesting Hydrocodone 7.5-325mg  and cylobenzaprine 5mg  submit to [facility] in [place] [phone]

## 2023-01-24 NOTE — Telephone Encounter (Signed)
Received a call from [facility] that they had concerns about the medications prescribed for patient and interactions with benzos.  Reviewed with Dr. Delton See, and she states okay to proceed, patient has been briefed.  Called pharmacy approximately 1pm and reviewed with pharmacist to proceed. At this time patient calls clinic stating prescriptions from Saturday are still not ready.  Called pharmacy, they will proceed, did not receive previous message apparently.   Patient made aware

## 2023-01-24 NOTE — Telephone Encounter (Signed)
Spoke with patient explained to her that she needed an appointment to get control substances referred her to Urgent care where she was seen

## 2023-02-07 ENCOUNTER — Other Ambulatory Visit: Payer: 59

## 2023-02-23 ENCOUNTER — Encounter: Payer: Self-pay | Admitting: Medical-Surgical

## 2023-03-16 ENCOUNTER — Ambulatory Visit: Payer: 59 | Admitting: Medical-Surgical

## 2023-03-19 ENCOUNTER — Ambulatory Visit (INDEPENDENT_AMBULATORY_CARE_PROVIDER_SITE_OTHER): Payer: 59

## 2023-03-19 ENCOUNTER — Other Ambulatory Visit: Payer: Self-pay

## 2023-03-19 ENCOUNTER — Ambulatory Visit
Admission: EM | Admit: 2023-03-19 | Discharge: 2023-03-19 | Disposition: A | Discharge status: Home / Self Care | Payer: 59 | Attending: Family Medicine | Admitting: Family Medicine

## 2023-03-19 DIAGNOSIS — R0781 Pleurodynia: Secondary | ICD-10-CM

## 2023-03-19 DIAGNOSIS — M4184 Other forms of scoliosis, thoracic region: Secondary | ICD-10-CM | POA: Diagnosis not present

## 2023-03-19 DIAGNOSIS — Y92009 Unspecified place in unspecified non-institutional (private) residence as the place of occurrence of the external cause: Secondary | ICD-10-CM

## 2023-03-19 DIAGNOSIS — W19XXXA Unspecified fall, initial encounter: Secondary | ICD-10-CM | POA: Diagnosis not present

## 2023-03-19 DIAGNOSIS — R079 Chest pain, unspecified: Secondary | ICD-10-CM

## 2023-03-19 DIAGNOSIS — S20211A Contusion of right front wall of thorax, initial encounter: Secondary | ICD-10-CM

## 2023-03-19 NOTE — Discharge Instructions (Signed)
No broken ribs. Take over the counter pain medicine if needed Call for problems

## 2023-03-19 NOTE — ED Provider Notes (Signed)
[name]    CSN: [id number] Arrival date & time: 03/19/23  1133      History   Chief Complaint No chief complaint on file.   HPI Emma Palmer is a 57 y.o. female.   Patient was putting on pants and lost her balance.  She fell over and hit her ribs on a wall.  She has rib pain on the right.  Is here to rule out fracture.  She has known osteoporosis.  She not having any trouble breathing    Past Medical History:  Diagnosis Date   Anxiety    B12 deficiency    Bulimia    Cancer (HCC)    Depression    GERD (gastroesophageal reflux disease)    Glaucoma    H. pylori infection    + serolgies   Headache(784.0)    Hip dysplasia    left - dr supple   History of Helicobacter pylori infection 10/03/2017   History of pelvic fracture    Hyperlipidemia    Intraocular pressure increase    dr Hazle Quant   Iron deficiency anemia    Nephrolithiasis    hx, dr Aldean Ast   OP (osteoporosis) 10/24/2006   dexa     Patient Active Problem List   Diagnosis Date Noted   Excessive tanning 05/19/2021   Irritable bowel syndrome with both constipation and diarrhea 05/19/2021   Underweight 05/19/2021   Migraines 04/12/2018   Gastric irritation 10/03/2017   Primary insomnia 10/03/2017   Arthralgia 01/06/2017   Iron deficiency anemia 06/07/2016   OAB (overactive bladder) 06/20/2014   GERD (gastroesophageal reflux disease) 04/15/2014   Vitamin B12 deficiency 02/10/2014   Bulimia nervosa 02/10/2014   Anorexia nervosa, restricting type 02/10/2014   ANXIETY STATE, UNSPECIFIED 04/09/2010   SOLITARY BONE CYST 08/31/2007   Hyperlipidemia 11/15/2006   ARTHRITIS, RIGHT HIP 11/15/2006   OSTEOPOROSIS 11/15/2006   Severe episode of recurrent major depressive disorder, without psychotic features (HCC) 11/15/2006    Past Surgical History:  Procedure Laterality Date   ESOPHAGOGASTRODUODENOSCOPY  05-06-05   dr Leone Payor -normal including duodenal bxs   LITHOTRIPSY     stent inserted     OB History   No obstetric history on file.      Home Medications    Prior to Admission medications   Medication Sig Start Date End Date Taking? Authorizing Provider  B Complex-C (B-COMPLEX WITH VITAMIN C) tablet Take 1 tablet by mouth daily.    [provider]  Folic Acid (FOLATE PO) Take 800 mcg by mouth 1 day or 1 dose.    [provider]  Magnesium 400 MG TABS Take by mouth.    [provider]  temazepam (RESTORIL) 30 MG capsule Take 1 capsule (30 mg total) by mouth at bedtime as needed for sleep. TAKE 1 CAPSULE BY MOUTH AT BEDTIME 10/10/22   Christen Butter, NP    Family History Family History  Problem Relation Age of Onset   Hyperlipidemia Mother    Stroke Mother    Blindness Mother    Heart attack Mother    Depression Mother    Heart attack Father 43   Hypertension Father    Depression Father    Hyperlipidemia Father    Heart disease Maternal Grandmother    Stroke Maternal Grandmother    Stroke Maternal Grandfather    Heart disease Paternal Grandmother    Colon cancer Neg Hx     Social History Social History   Tobacco Use  Smoking status: Former    Current packs/day: 0.00    Types: Cigarettes    Quit date: 2018    Years since quitting: 6.6   Smokeless tobacco: Never   Tobacco comments:    1/2 pack or less  Vaping Use   Vaping status: Never Used  Substance Use Topics   Alcohol use: No    Alcohol/week: 0.0 standard drinks of alcohol   Drug use: No     Allergies   Restasis [cyclosporine] and Other   Review of Systems Review of Systems  See HPI Physical Exam Triage Vital Signs ED Triage Vitals [03/19/23 1142]  Encounter Vitals Group     BP 111/77     Systolic BP Percentile      Diastolic BP Percentile      Pulse Rate 66     Resp 16     Temp (!) 97.5 F (36.4 C)     Temp Source Oral     SpO2 100 %     Weight      Height      Head Circumference      Peak Flow      Pain Score 9     Pain Loc      Pain  Education      Exclude from Growth Chart    No data found.  Updated Vital Signs BP 111/77 (BP Location: Right Arm)   Pulse 66   Temp (!) 97.5 F (36.4 C) (Oral)   Resp 16   LMP 10/27/2010   SpO2 100%      Physical Exam Constitutional:      General: She is not in acute distress.    Appearance: She is well-developed. She is ill-appearing.     Comments: Appears frail, underweight, chronically ill  HENT:     Head: Normocephalic and atraumatic.  Eyes:     Conjunctiva/sclera: Conjunctivae normal.     Pupils: Pupils are equal, round, and reactive to light.  Cardiovascular:     Rate and Rhythm: Normal rate.  Pulmonary:     Effort: Pulmonary effort is normal. No respiratory distress.     Breath sounds: Normal breath sounds.     Comments: There is no bruising visible.  No palpable deformity or crepitus.  There is some tenderness at the lower rib border in the mid axillary line.  Lungs are clear Chest:     Chest wall: Tenderness present.  Abdominal:     General: There is no distension.     Palpations: Abdomen is soft.  Musculoskeletal:        General: Normal range of motion.     Cervical back: Normal range of motion.  Skin:    General: Skin is warm and dry.  Neurological:     Mental Status: She is alert.      UC Treatments / Results  Labs (all labs ordered are listed, but only abnormal results are displayed) Labs Reviewed - No data to display  EKG   Radiology DG Ribs Unilateral W/Chest Right  Result Date: 03/19/2023 CLINICAL DATA:  Chest pain after falling. Recent sternal fracture. History of osteoporosis. EXAM: RIGHT RIBS AND CHEST - 3+ VIEW COMPARISON:  Chest radiograph 01/21/2023 and 07/14/2008. FINDINGS: The heart size and mediastinal contours are stable. The lungs are clear. There is no pleural effusion or pneumothorax. The bones are demineralized. Markers were placed over the area of pain along the inferior aspect of the right lateral chest. No acute fractures are  seen  in this area. Suspected old fracture of the right 4th rib. Mild thoracic scoliosis and spondylosis. Previously demonstrated sternal fracture is not well seen on these views. IMPRESSION: No evidence of acute right-sided rib fracture, pleural effusion or pneumothorax. Previously demonstrated sternal fracture is not well seen on these views. Electronically Signed   By: Carey Bullocks M.D.   On: 03/19/2023 12:13    Procedures Procedures (including critical care time)  Medications Ordered in UC Medications - No data to display  Initial Impression / Assessment and Plan / UC Course  I have reviewed the triage vital signs and the nursing notes.  Pertinent labs & imaging results that were available during my care of the patient were reviewed by me and considered in my medical decision making (see chart for details).     Final Clinical Impressions(s) / UC Diagnoses   Final diagnoses:  Rib contusion, right, initial encounter  Rib pain  Fall in home, initial encounter     Discharge Instructions      No broken ribs. Take over the counter pain medicine if needed Call for problems   ED Prescriptions   None    PDMP not reviewed this encounter.   Eustace Moore, MD 03/19/23 1224

## 2023-03-19 NOTE — ED Triage Notes (Signed)
Fell into wall this morning, struck right ribs on wall corner

## 2023-04-17 ENCOUNTER — Ambulatory Visit: Payer: 59 | Admitting: Medical-Surgical

## 2023-04-21 ENCOUNTER — Ambulatory Visit (INDEPENDENT_AMBULATORY_CARE_PROVIDER_SITE_OTHER): Payer: 59 | Admitting: Medical-Surgical

## 2023-04-21 VITALS — BP 107/66 | HR 60 | Resp 20 | Ht 60.0 in | Wt 77.3 lb

## 2023-04-21 DIAGNOSIS — Z0000 Encounter for general adult medical examination without abnormal findings: Secondary | ICD-10-CM | POA: Diagnosis not present

## 2023-04-21 DIAGNOSIS — R21 Rash and other nonspecific skin eruption: Secondary | ICD-10-CM

## 2023-04-21 DIAGNOSIS — R202 Paresthesia of skin: Secondary | ICD-10-CM

## 2023-04-21 DIAGNOSIS — E782 Mixed hyperlipidemia: Secondary | ICD-10-CM

## 2023-04-21 DIAGNOSIS — F50012 Anorexia nervosa, restricting type, severe: Secondary | ICD-10-CM

## 2023-04-21 DIAGNOSIS — F5101 Primary insomnia: Secondary | ICD-10-CM | POA: Diagnosis not present

## 2023-04-21 DIAGNOSIS — E538 Deficiency of other specified B group vitamins: Secondary | ICD-10-CM

## 2023-04-21 DIAGNOSIS — F411 Generalized anxiety disorder: Secondary | ICD-10-CM

## 2023-04-21 DIAGNOSIS — D509 Iron deficiency anemia, unspecified: Secondary | ICD-10-CM

## 2023-04-21 MED ORDER — CLOTRIMAZOLE-BETAMETHASONE 1-0.05 % EX CREA
1.0000 | TOPICAL_CREAM | Freq: Every day | CUTANEOUS | 0 refills | Status: DC
Start: 2023-04-21 — End: 2023-04-21

## 2023-04-21 MED ORDER — TEMAZEPAM 30 MG PO CAPS: 30.0000 mg | ORAL_CAPSULE | Freq: Every evening | ORAL | 1 refills | Status: DC | PRN

## 2023-04-21 MED ORDER — CLOTRIMAZOLE-BETAMETHASONE 1-0.05 % EX CREA
1.0000 | TOPICAL_CREAM | Freq: Every day | CUTANEOUS | 0 refills | Status: DC
Start: 2023-04-21 — End: 2023-10-20

## 2023-04-21 NOTE — Patient Instructions (Signed)
Preventive Care 40-56 Years Old, Female Preventive care refers to lifestyle choices and visits with your health care provider that can promote health and wellness. Preventive care visits are also called wellness exams. What can I expect for my preventive care visit? Counseling Your health care provider may ask you questions about your: Medical history, including: Past medical problems. Family medical history. Pregnancy history. Current health, including: Menstrual cycle. Method of birth control. Emotional well-being. Home life and relationship well-being. Sexual activity and sexual health. Lifestyle, including: Alcohol, nicotine or tobacco, and drug use. Access to firearms. Diet, exercise, and sleep habits. Work and work environment. Sunscreen use. Safety issues such as seatbelt and bike helmet use. Physical exam Your health care provider will check your: Height and weight. These may be used to calculate your BMI (body mass index). BMI is a measurement that tells if you are at a healthy weight. Waist circumference. This measures the distance around your waistline. This measurement also tells if you are at a healthy weight and may help predict your risk of certain diseases, such as type 2 diabetes and high blood pressure. Heart rate and blood pressure. Body temperature. Skin for abnormal spots. What immunizations do I need?  Vaccines are usually given at various ages, according to a schedule. Your health care provider will recommend vaccines for you based on your age, medical history, and lifestyle or other factors, such as travel or where you work. What tests do I need? Screening Your health care provider may recommend screening tests for certain conditions. This may include: Lipid and cholesterol levels. Diabetes screening. This is done by checking your blood sugar (glucose) after you have not eaten for a while (fasting). Pelvic exam and Pap test. Hepatitis B test. Hepatitis C  test. HIV (human immunodeficiency virus) test. STI (sexually transmitted infection) testing, if you are at risk. Lung cancer screening. Colorectal cancer screening. Mammogram. Talk with your health care provider about when you should start having regular mammograms. This may depend on whether you have a family history of breast cancer. BRCA-related cancer screening. This may be done if you have a family history of breast, ovarian, tubal, or peritoneal cancers. Bone density scan. This is done to screen for osteoporosis. Talk with your health care provider about your test results, treatment options, and if necessary, the need for more tests. Follow these instructions at home: Eating and drinking  Eat a diet that includes fresh fruits and vegetables, whole grains, lean protein, and low-fat dairy products. Take vitamin and mineral supplements as recommended by your health care provider. Do not drink alcohol if: Your health care provider tells you not to drink. You are pregnant, may be pregnant, or are planning to become pregnant. If you drink alcohol: Limit how much you have to 0-1 drink a day. Know how much alcohol is in your drink. In the [place] one drink equals one 12 oz bottle of beer (355 mL), one 5 oz glass of wine (148 mL), or one 1 oz glass of hard liquor (44 mL). Lifestyle Brush your teeth every morning and night with fluoride toothpaste. Floss one time each day. Exercise for at least 30 minutes 5 or more days each week. Do not use any products that contain nicotine or tobacco. These products include cigarettes, chewing tobacco, and vaping devices, such as e-cigarettes. If you need help quitting, ask your health care provider. Do not use drugs. If you are sexually active, practice safe sex. Use a condom or other form of protection to   prevent STIs. If you do not wish to become pregnant, use a form of birth control. If you plan to become pregnant, see your health care provider for a  prepregnancy visit. Take aspirin only as told by your health care provider. Make sure that you understand how much to take and what form to take. Work with your health care provider to find out whether it is safe and beneficial for you to take aspirin daily. Find healthy ways to manage stress, such as: Meditation, yoga, or listening to music. Journaling. Talking to a trusted person. Spending time with friends and family. Minimize exposure to UV radiation to reduce your risk of skin cancer. Safety Always wear your seat belt while driving or riding in a vehicle. Do not drive: If you have been drinking alcohol. Do not ride with someone who has been drinking. When you are tired or distracted. While texting. If you have been using any mind-altering substances or drugs. Wear a helmet and other protective equipment during sports activities. If you have firearms in your house, make sure you follow all gun safety procedures. Seek help if you have been physically or sexually abused. What's next? Visit your health care provider once a year for an annual wellness visit. Ask your health care provider how often you should have your eyes and teeth checked. Stay up to date on all vaccines. This information is not intended to replace advice given to you by your health care provider. Make sure you discuss any questions you have with your health care provider. Document Revised: 12/30/2020 Document Reviewed: 12/30/2020 Elsevier Patient Education  2024 [employer]  

## 2023-04-21 NOTE — Progress Notes (Signed)
Complete physical exam  Patient: Emma Palmer   DOB: [birth year]   57 y.o. Female  MRN: [id number]  Subjective:    No chief complaint on file.   Emma Palmer is a 57 y.o. female who presents today for a complete physical exam. She reports consuming a  very restrictive  diet. The patient does not participate in regular exercise at present. She generally feels poorly. She reports sleeping fairly well. She does not have additional problems to discuss today.    Most recent fall risk assessment:    01/13/2023   11:26 AM  Fall Risk   Falls in the past year? 0  Number falls in past yr: 0  Injury with Fall? 0  Risk for fall due to : No Fall Risks  Follow up Falls evaluation completed     Most recent depression screenings:    01/13/2023   11:26 AM 10/10/2022   11:46 AM  PHQ 2/9 Scores  PHQ - 2 Score 2 3  PHQ- 9 Score 7 6    Vision:Within last year and Dental: No current dental problems and Receives regular dental care    Patient Care Team: Christen Butter, NP as PCP - General (Nurse Practitioner)   Outpatient Medications Prior to Visit  Medication Sig   B Complex-C (B-COMPLEX WITH VITAMIN C) tablet Take 1 tablet by mouth daily.   Cyanocobalamin (B-12) 500 MCG SUBL    Ferrous Sulfate (ONE VITE FERROUS SULFATE) 300 MG/6.8ML SOLN Take 5 mLs by mouth daily.   FLUoxetine (PROZAC) 10 MG capsule Take 10 mg by mouth daily.   Folic Acid (FOLATE PO) Take 800 mcg by mouth 1 day or 1 dose.   Magnesium 400 MG TABS Take by mouth.   Multiple Vitamins-Minerals (ZINC PO) 4 mg.   [DISCONTINUED] temazepam (RESTORIL) 30 MG capsule Take 1 capsule (30 mg total) by mouth at bedtime as needed for sleep. TAKE 1 CAPSULE BY MOUTH AT BEDTIME   No facility-administered medications prior to visit.    Review of Systems  Constitutional:  Negative for chills, fever, malaise/fatigue and weight loss.  HENT:  Negative for congestion, ear pain, hearing loss, sinus pain and sore throat.        Tongue ulcers    Eyes:  Negative for blurred vision, photophobia and pain.       Eyes are tired and burning at the end of the day  Respiratory:  Negative for cough, shortness of breath and wheezing.   Cardiovascular:  Negative for chest pain, palpitations and leg swelling.  Gastrointestinal:  Negative for abdominal pain, constipation, diarrhea, heartburn, nausea and vomiting.  Genitourinary:  Negative for dysuria, frequency and urgency.  Musculoskeletal:  Positive for back pain. Negative for falls and neck pain.       Balance issues  Skin:  Positive for rash (bilateral great toes, peeling skin). Negative for itching.  Neurological:  Positive for tingling. Negative for dizziness, weakness and headaches.  Endo/Heme/Allergies:  Negative for polydipsia. Does not bruise/bleed easily.  Psychiatric/Behavioral:  Positive for depression. Negative for substance abuse and suicidal ideas. The patient is nervous/anxious. The patient does not have insomnia.      Objective:    BP 107/66 (BP Location: Right Arm, Cuff Size: Small)   Pulse 60   Resp 20   Ht 5' (1.524 m)   Wt 77 lb 4.8 oz (35.1 kg)   LMP 10/27/2010   SpO2 100%   BMI 15.10 kg/m    Physical Exam Vitals reviewed.  Constitutional:      General: She is not in acute distress.    Appearance: Normal appearance. She is cachectic. She is ill-appearing. She is not toxic-appearing or diaphoretic.  HENT:     Head: Normocephalic and atraumatic.     Right Ear: Tympanic membrane, ear canal and external ear normal. There is no impacted cerumen.     Left Ear: Tympanic membrane, ear canal and external ear normal. There is no impacted cerumen.     Nose: Nose normal. No congestion or rhinorrhea.     Mouth/Throat:     Mouth: Mucous membranes are moist.     Pharynx: No oropharyngeal exudate or posterior oropharyngeal erythema.  Eyes:     General: No scleral icterus.       Right eye: No discharge.        Left eye: No discharge.     Extraocular Movements:  Extraocular movements intact.     Conjunctiva/sclera: Conjunctivae normal.     Pupils: Pupils are equal, round, and reactive to light.  Neck:     Thyroid: No thyromegaly.     Vascular: No carotid bruit or JVD.     Trachea: Trachea normal.  Cardiovascular:     Rate and Rhythm: Normal rate and regular rhythm.     Pulses: Normal pulses.     Heart sounds: Normal heart sounds. No murmur heard.    No friction rub. No gallop.  Pulmonary:     Effort: Pulmonary effort is normal. No respiratory distress.     Breath sounds: Normal breath sounds. No wheezing.  Abdominal:     General: Bowel sounds are normal. There is no distension.     Palpations: Abdomen is soft.     Tenderness: There is no abdominal tenderness. There is no guarding.  Musculoskeletal:        General: Normal range of motion.     Cervical back: Normal range of motion and neck supple.  Lymphadenopathy:     Cervical: No cervical adenopathy.  Skin:    General: Skin is warm and dry.  Neurological:     Mental Status: She is alert and oriented to person, place, and time.     Cranial Nerves: No cranial nerve deficit.  Psychiatric:        Mood and Affect: Mood normal.        Behavior: Behavior normal. Behavior is cooperative.        Thought Content: Thought content normal.        Judgment: Judgment normal.   No results found for any visits on 04/21/23.     Assessment & Plan:    Routine Health Maintenance and Physical Exam  Immunization History  Administered Date(s) Administered   Tdap 03/17/2016    Health Maintenance  Topic Date Due   Cervical Cancer Screening (HPV/Pap Cotest)  05/11/2012   COVID-19 Vaccine (1 - 2023-24 season) 05/07/2023 (Originally 03/19/2023)   INFLUENZA VACCINE  10/16/2023 (Originally 02/16/2023)   MAMMOGRAM  01/02/2024   Fecal DNA (Cologuard)  04/15/2025   DTaP/Tdap/Td (2 - Td or Tdap) 03/17/2026   HPV VACCINES  Aged Out   Colonoscopy  Discontinued   Hepatitis C Screening  Discontinued   HIV  Screening  Discontinued   Zoster Vaccines- Shingrix  Discontinued   Discussed health benefits of physical activity, and encouraged her to engage in regular exercise appropriate for her age and condition.  1. Annual physical exam Checking labs as below.  Up-to-date on preventative care.  Wellness information provided with  AVS. - CBC with Differential/Platelet - CMP14+EGFR  2. Paresthesia of both lower extremities Continues to have significant paresthesias in both lower extremities however she is also seeing tingling in other places such as her forehead, ribs, arms, and hands.  Unclear etiology to this.  Checking labs as below.  Notable for evening eye fatigue and generalized weakness.  Some concern for possible myasthenia gravis.  We will get a nerve conduction study for further evaluation. - CMP14+EGFR - Iron, TIBC and Ferritin Panel - Vitamin B12 - VITAMIN D 25 Hydroxy (Vit-D Deficiency, Fractures) - Ambulatory referral to Neurology  3. Severe restricting type anorexia nervosa Has been working on increasing her protein and changing her diet to ensure adequate nutrition.  She is taking oral supplements for vitamin insufficiencies.  She was challenged at her our last chronic disease follow-up to gain 5 pounds and maintain that before our next follow-up.  Today she has done exactly that and has gained exactly 5 pounds.  She is open to slow, steady progress so I have challenged her to gain another 5 pounds by the time we have followed up in 6 months.  Information given on following a low FODMAP diet to minimize GI side effects when consuming vegetables.  4. Primary insomnia Doing well on temazepam 30 mg nightly as needed.  Refill today.  5. Vitamin B12 deficiency Checking vitamin B12. - Vitamin B12  6. Iron deficiency anemia, unspecified iron deficiency anemia type Checking iron supplementation.  If she does need iron supplementation, she would like to do the liquid iron that is on her med  list as this is more tolerable. - Iron, TIBC and Ferritin Panel  7. Mixed hyperlipidemia Checking lipid panel today. - Lipid panel  8. ANXIETY STATE, UNSPECIFIED We have discussed multiple times regarding anxiety and mood management.  She has fluoxetine 10 mg daily at home but has not initiated this prescription.  Her PHQ-9 and GAD-7 scores are significantly elevated and she is aware of the need for further management.  Recommend starting fluoxetine that she has at home and patient verbalized agreement to start the medication.  If fluoxetine is not helpful, would like to consider Paxil to help with weight gain.  9. Rash She has developed a rash on her bilateral great toes which started over the summer and has not improved.  The skin gets very dry and cracks before peeling.  The end of the great toes are irritated and peeling and it is extended back to the lateral nail folds.  Unclear etiology.  Adding Lotrisone twice daily for up to 14 days. - clotrimazole-betamethasone (LOTRISONE) cream; Apply 1 Application topically daily.  Dispense: 30 g; Refill: 0  Return in about 6 months (around 10/20/2023) for chronic disease follow up.   Christen Butter, NP

## 2023-04-21 NOTE — Addendum Note (Signed)
Addended byChristen Butter on: 04/21/2023 02:56 PM   Modules accepted: Level of Service

## 2023-04-22 LAB — CBC WITH DIFFERENTIAL/PLATELET
Basophils Absolute: 0.1 10*3/uL (ref 0.0–0.2)
Basos: 1 %
EOS (ABSOLUTE): 0 10*3/uL (ref 0.0–0.4)
Eos: 1 %
Hematocrit: 38.7 % (ref 34.0–46.6)
Hemoglobin: 13.2 g/dL (ref 11.1–15.9)
Immature Grans (Abs): 0 10*3/uL (ref 0.0–0.1)
Immature Granulocytes: 0 %
Lymphocytes Absolute: 1.8 10*3/uL (ref 0.7–3.1)
Lymphs: 49 %
MCH: 31 pg (ref 26.6–33.0)
MCHC: 34.1 g/dL (ref 31.5–35.7)
MCV: 91 fL (ref 79–97)
Monocytes Absolute: 0.4 10*3/uL (ref 0.1–0.9)
Monocytes: 11 %
Neutrophils Absolute: 1.3 10*3/uL — ABNORMAL LOW (ref 1.4–7.0)
Neutrophils: 38 %
Platelets: 403 10*3/uL (ref 150–450)
RBC: 4.26 x10E6/uL (ref 3.77–5.28)
RDW: 13 % (ref 11.7–15.4)
WBC: 3.6 10*3/uL (ref 3.4–10.8)

## 2023-04-22 LAB — CMP14+EGFR
ALT: 28 [IU]/L (ref 0–32)
AST: 33 [IU]/L (ref 0–40)
Albumin: 4.3 g/dL (ref 3.8–4.9)
Alkaline Phosphatase: 190 [IU]/L — ABNORMAL HIGH (ref 44–121)
BUN/Creatinine Ratio: 4 — ABNORMAL LOW (ref 9–23)
BUN: 4 mg/dL — ABNORMAL LOW (ref 6–24)
Bilirubin Total: 0.5 mg/dL (ref 0.0–1.2)
CO2: 27 mmol/L (ref 20–29)
Calcium: 9.4 mg/dL (ref 8.7–10.2)
Chloride: 95 mmol/L — ABNORMAL LOW (ref 96–106)
Creatinine, Ser: 0.94 mg/dL (ref 0.57–1.00)
Globulin, Total: 1.9 g/dL (ref 1.5–4.5)
Glucose: 79 mg/dL (ref 70–99)
Potassium: 4.2 mmol/L (ref 3.5–5.2)
Sodium: 134 mmol/L (ref 134–144)
Total Protein: 6.2 g/dL (ref 6.0–8.5)
eGFR: 71 mL/min/{1.73_m2} (ref >59)

## 2023-04-22 LAB — LIPID PANEL
Chol/HDL Ratio: 4.2 {ratio} (ref 0.0–4.4)
Cholesterol, Total: 259 mg/dL — ABNORMAL HIGH (ref 100–199)
HDL: 62 mg/dL (ref >39)
LDL Chol Calc (NIH): 183 mg/dL — ABNORMAL HIGH (ref 0–99)
Triglycerides: 84 mg/dL (ref 0–149)
VLDL Cholesterol Cal: 14 mg/dL (ref 5–40)

## 2023-04-22 LAB — IRON,TIBC AND FERRITIN PANEL
Ferritin: 28 ng/mL (ref 15–150)
Iron Saturation: 18 % (ref 15–55)
Iron: 73 ug/dL (ref 27–159)
Total Iron Binding Capacity: 402 ug/dL (ref 250–450)
UIBC: 329 ug/dL (ref 131–425)

## 2023-04-22 LAB — VITAMIN D 25 HYDROXY (VIT D DEFICIENCY, FRACTURES): Vit D, 25-Hydroxy: 69.1 ng/mL (ref 30.0–100.0)

## 2023-04-22 LAB — VITAMIN B12: Vitamin B-12: 1666 pg/mL — ABNORMAL HIGH (ref 232–1245)

## 2023-04-24 ENCOUNTER — Encounter: Payer: Self-pay | Admitting: Medical-Surgical

## 2023-05-15 ENCOUNTER — Encounter: Payer: Self-pay | Admitting: Emergency Medicine

## 2023-05-15 ENCOUNTER — Ambulatory Visit
Admission: EM | Admit: 2023-05-15 | Discharge: 2023-05-15 | Disposition: A | Discharge status: Home / Self Care | Payer: 59 | Attending: Family Medicine | Admitting: Family Medicine

## 2023-05-15 DIAGNOSIS — Z91018 Allergy to other foods: Secondary | ICD-10-CM | POA: Diagnosis not present

## 2023-05-15 DIAGNOSIS — J029 Acute pharyngitis, unspecified: Secondary | ICD-10-CM | POA: Diagnosis not present

## 2023-05-15 LAB — POC SARS CORONAVIRUS 2 AG -  ED: SARS Coronavirus 2 Ag: NEGATIVE

## 2023-05-15 NOTE — ED Provider Notes (Signed)
[name]    CSN: [id number] Arrival date & time: 05/15/23  5366      History   Chief Complaint No chief complaint on file.   HPI Emma Palmer is a 57 y.o. female.   HPI Pleasant 57 year old female presents with sore throat for 1 day.  Patient reports that she ate raw oysters yesterday and thinks the oysters irritated her throat.  Patient has oysters with her this morning.  Patient is concerned with possible COVID-19 and request COVID-19 swab this morning.  PMH significant for glaucoma, bulimia, and cancer.   Past Medical History:  Diagnosis Date   Anxiety    B12 deficiency    Bulimia    Cancer (HCC)    Depression    GERD (gastroesophageal reflux disease)    Glaucoma    H. pylori infection    + serolgies   Headache(784.0)    Hip dysplasia    left - dr supple   History of Helicobacter pylori infection 10/03/2017   History of pelvic fracture    Hyperlipidemia    Intraocular pressure increase    dr Hazle Quant   Iron deficiency anemia    Nephrolithiasis    hx, dr Aldean Ast   OP (osteoporosis) 10/24/2006   dexa     Patient Active Problem List   Diagnosis Date Noted   Excessive tanning 05/19/2021   Irritable bowel syndrome with both constipation and diarrhea 05/19/2021   Underweight 05/19/2021   Migraines 04/12/2018   Gastric irritation 10/03/2017   Primary insomnia 10/03/2017   Arthralgia 01/06/2017   Iron deficiency anemia 06/07/2016   OAB (overactive bladder) 06/20/2014   GERD (gastroesophageal reflux disease) 04/15/2014   Vitamin B12 deficiency 02/10/2014   Bulimia nervosa 02/10/2014   Anorexia nervosa, restricting type 02/10/2014   ANXIETY STATE, UNSPECIFIED 04/09/2010   SOLITARY BONE CYST 08/31/2007   Hyperlipidemia 11/15/2006   ARTHRITIS, RIGHT HIP 11/15/2006   OSTEOPOROSIS 11/15/2006   Severe episode of recurrent major depressive disorder, without psychotic features (HCC) 11/15/2006    Past Surgical History:  Procedure Laterality Date    ESOPHAGOGASTRODUODENOSCOPY  05-06-05   dr Leone Payor -normal including duodenal bxs   LITHOTRIPSY     stent inserted    OB History   No obstetric history on file.      Home Medications    Prior to Admission medications   Medication Sig Start Date End Date Taking? Authorizing Provider  B Complex-C (B-COMPLEX WITH VITAMIN C) tablet Take 1 tablet by mouth daily.    [provider]  clotrimazole-betamethasone (LOTRISONE) cream Apply 1 Application topically daily. 04/21/23   Christen Butter, NP  Cyanocobalamin (B-12) 500 MCG SUBL     [provider]  Ferrous Sulfate (ONE VITE FERROUS SULFATE) 300 MG/6.8ML SOLN Take 5 mLs by mouth daily.    [provider]  FLUoxetine (PROZAC) 10 MG capsule Take 10 mg by mouth daily.    [provider]  Folic Acid (FOLATE PO) Take 800 mcg by mouth 1 day or 1 dose.    [provider]  Magnesium 400 MG TABS Take by mouth.    [provider]  Multiple Vitamins-Minerals (ZINC PO) 4 mg.    [provider]  temazepam (RESTORIL) 30 MG capsule Take 1 capsule (30 mg total) by mouth at bedtime as needed for sleep. TAKE 1 CAPSULE BY MOUTH AT BEDTIME 04/21/23   Christen Butter, NP    Family History Family History  Problem Relation Age of Onset   Hyperlipidemia  Mother    Stroke Mother    Blindness Mother    Heart attack Mother    Depression Mother    Heart attack Father 14   Hypertension Father    Depression Father    Hyperlipidemia Father    Heart disease Maternal Grandmother    Stroke Maternal Grandmother    Stroke Maternal Grandfather    Heart disease Paternal Grandmother    Colon cancer Neg Hx     Social History Social History   Tobacco Use   Smoking status: Former    Current packs/day: 0.00    Types: Cigarettes    Quit date: 2018    Years since quitting: 6.8   Smokeless tobacco: Never   Tobacco comments:    1/2 pack or less  Vaping Use   Vaping status: Never Used  Substance Use Topics    Alcohol use: No    Alcohol/week: 0.0 standard drinks of alcohol   Drug use: No     Allergies   Restasis [cyclosporine] and Other   Review of Systems Review of Systems  HENT:  Positive for sore throat.      Physical Exam Triage Vital Signs ED Triage Vitals  Encounter Vitals Group     BP      Systolic BP Percentile      Diastolic BP Percentile      Pulse      Resp      Temp      Temp src      SpO2      Weight      Height      Head Circumference      Peak Flow      Pain Score      Pain Loc      Pain Education      Exclude from Growth Chart    No data found.  Updated Vital Signs BP (!) 128/90 (BP Location: Right Arm)   Pulse 71   Temp 98.1 F (36.7 C) (Oral)   Resp 15   LMP 10/27/2010   SpO2 100%     Physical Exam Vitals and nursing note reviewed.  Constitutional:      Appearance: Normal appearance.     Comments: Cachectic in appearance  HENT:     Head: Normocephalic and atraumatic.     Right Ear: Tympanic membrane, ear canal and external ear normal.     Left Ear: Tympanic membrane, ear canal and external ear normal.     Mouth/Throat:     Mouth: Mucous membranes are moist.     Pharynx: Oropharynx is clear.     Comments: Mildly inflamed posterior oropharynx noted Eyes:     Extraocular Movements: Extraocular movements intact.     Conjunctiva/sclera: Conjunctivae normal.     Pupils: Pupils are equal, round, and reactive to light.  Cardiovascular:     Rate and Rhythm: Normal rate and regular rhythm.     Pulses: Normal pulses.     Heart sounds: Normal heart sounds.  Pulmonary:     Effort: Pulmonary effort is normal.     Breath sounds: Normal breath sounds. No wheezing, rhonchi or rales.  Musculoskeletal:        General: Normal range of motion.     Cervical back: Normal range of motion and neck supple.  Skin:    General: Skin is warm and dry.  Neurological:     General: No focal deficit present.     Mental Status: She is alert  and oriented to  person, place, and time. Mental status is at baseline.  Psychiatric:        Mood and Affect: Mood normal.        Behavior: Behavior normal.        Thought Content: Thought content normal.      UC Treatments / Results  Labs (all labs ordered are listed, but only abnormal results are displayed) Labs Reviewed  POC SARS CORONAVIRUS 2 AG -  ED    EKG   Radiology No results found.  Procedures Procedures (including critical care time)  Medications Ordered in UC Medications - No data to display  Initial Impression / Assessment and Plan / UC Course  I have reviewed the triage vital signs and the nursing notes.  Pertinent labs & imaging results that were available during my care of the patient were reviewed by me and considered in my medical decision making (see chart for details).     MDM: 1.  Sore throat-oropharynx within normal limits today, POC COVID-19 test negative today; 2.  Food allergy-advised patient to avoid canned oysters in the future. Advised patient COVID-19 was negative this morning.  Advised patient if symptoms worsen and/or unresolved please follow-up with PCP or here for further evaluation.  Patient discharged home, hemodynamically stable. Final Clinical Impressions(s) / UC Diagnoses   Final diagnoses:  Sore throat  Food allergy     Discharge Instructions      Advised patient COVID-19 was negative this morning.  Advised patient if symptoms worsen and/or unresolved please follow-up with PCP or here for further evaluation.     ED Prescriptions   None    PDMP not reviewed this encounter.   Trevor Iha, FNP 05/15/23 1001

## 2023-05-15 NOTE — ED Triage Notes (Signed)
Pt c/o sore throat x 1 day. States that she ate raw oysters yesterday and thinks the oysters irritated her throat. No known fevers. No known sick contacts

## 2023-05-15 NOTE — Discharge Instructions (Addendum)
Advised patient COVID-19 was negative this morning.  Advised patient if symptoms worsen and/or unresolved please follow-up with PCP or here for further evaluation.

## 2023-05-30 IMAGING — DX DG HIP (WITH OR WITHOUT PELVIS) 2V BILAT
5 series · 5 of 5 positions shown · non-contrast
Comparison: March 27, 2011.

CLINICAL DATA: Old pubic ramus fracture.

EXAM:
DG HIP (WITH OR WITHOUT PELVIS) 2V BILAT

[pelvis ap]
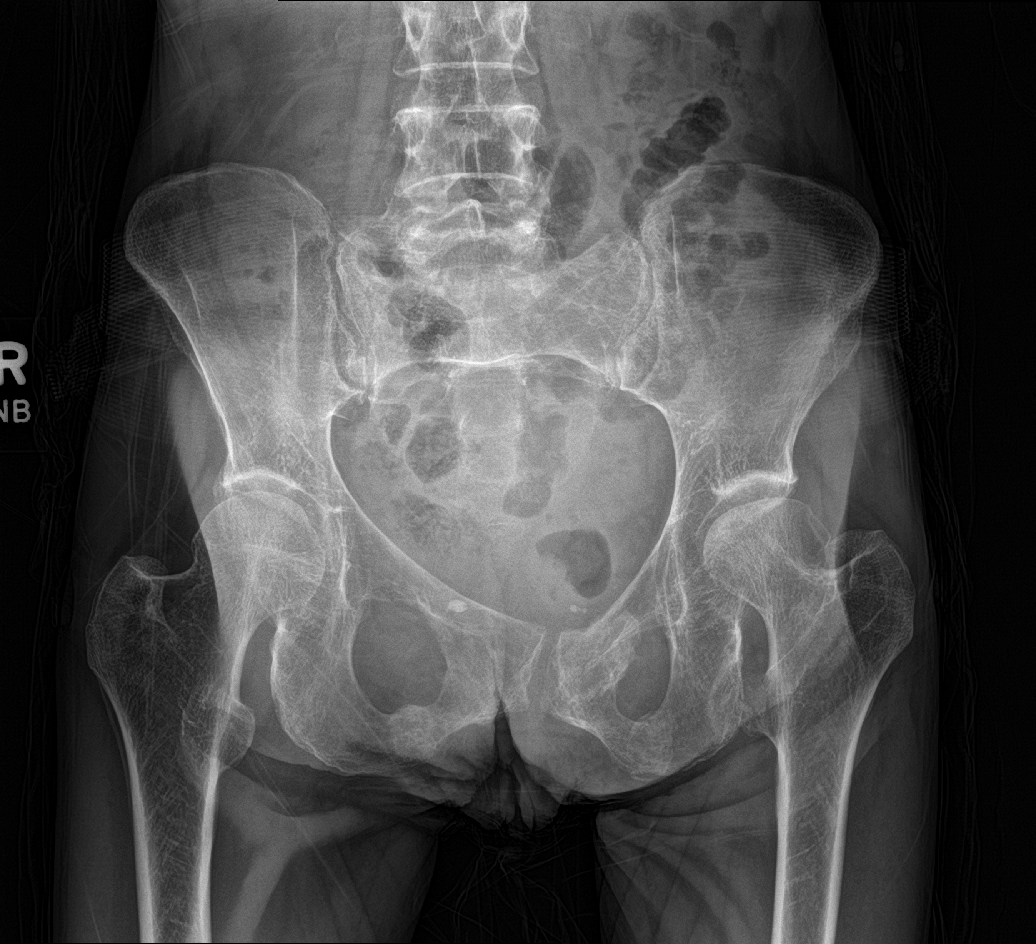

[hip ap (1 of 2)]
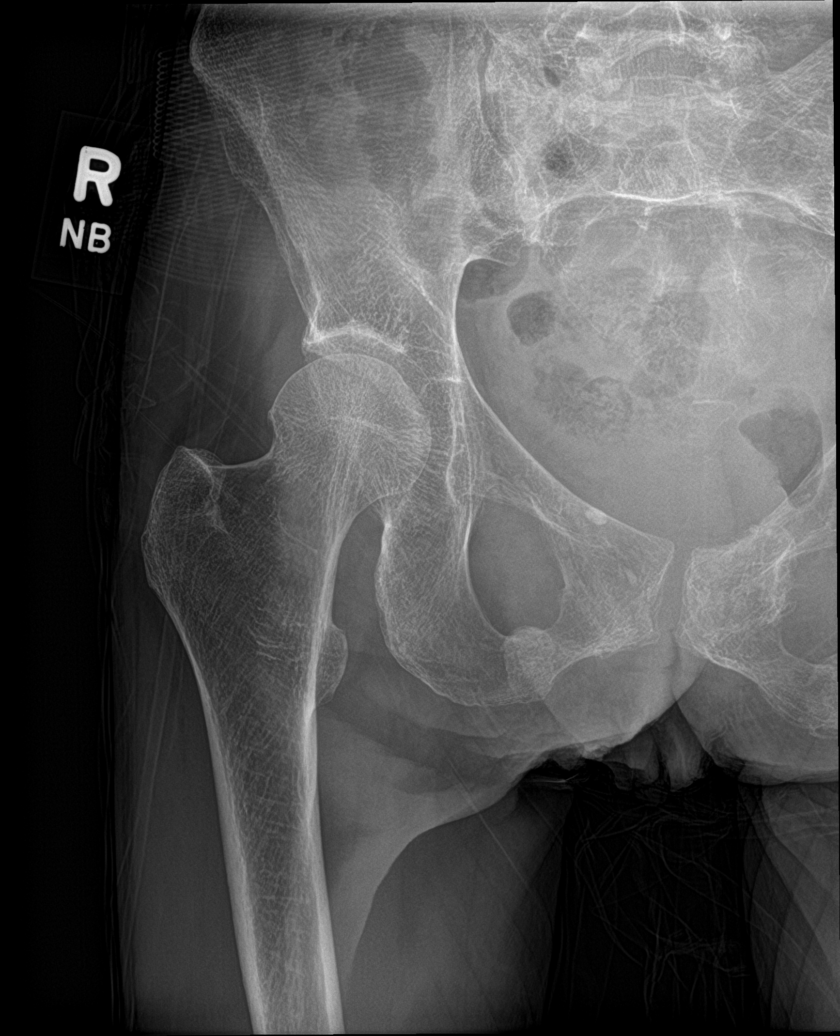

[hip ap (2 of 2)]
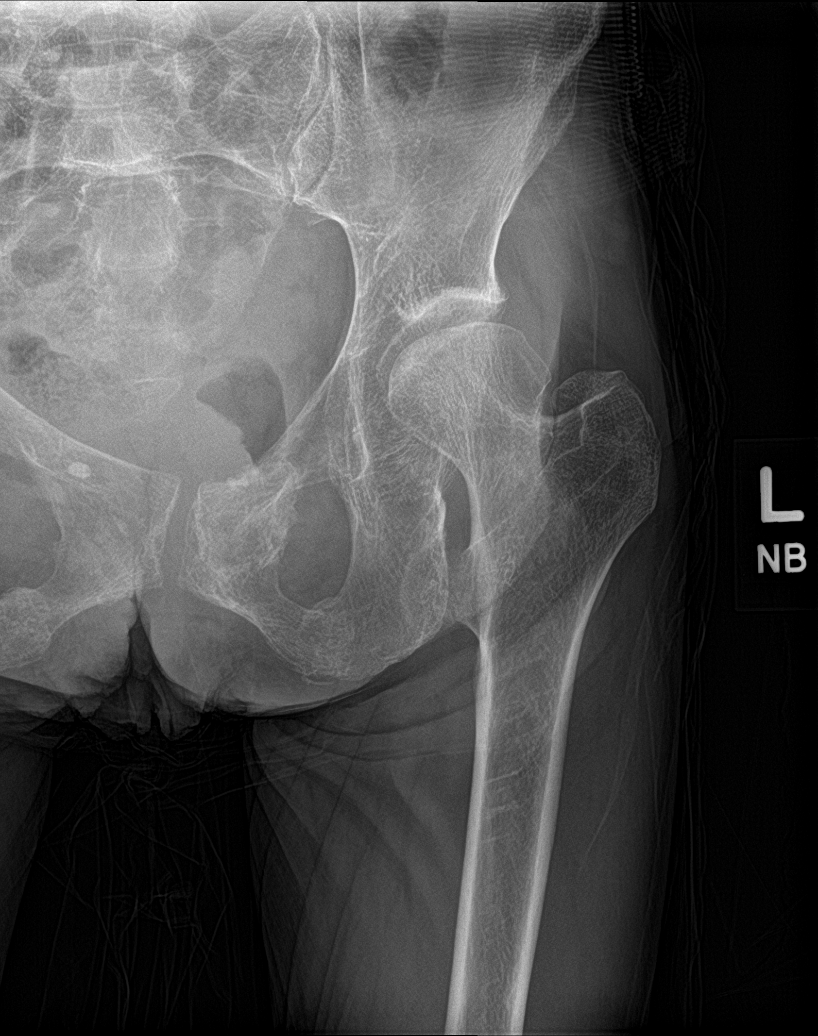

[hip lat (1 of 2)]
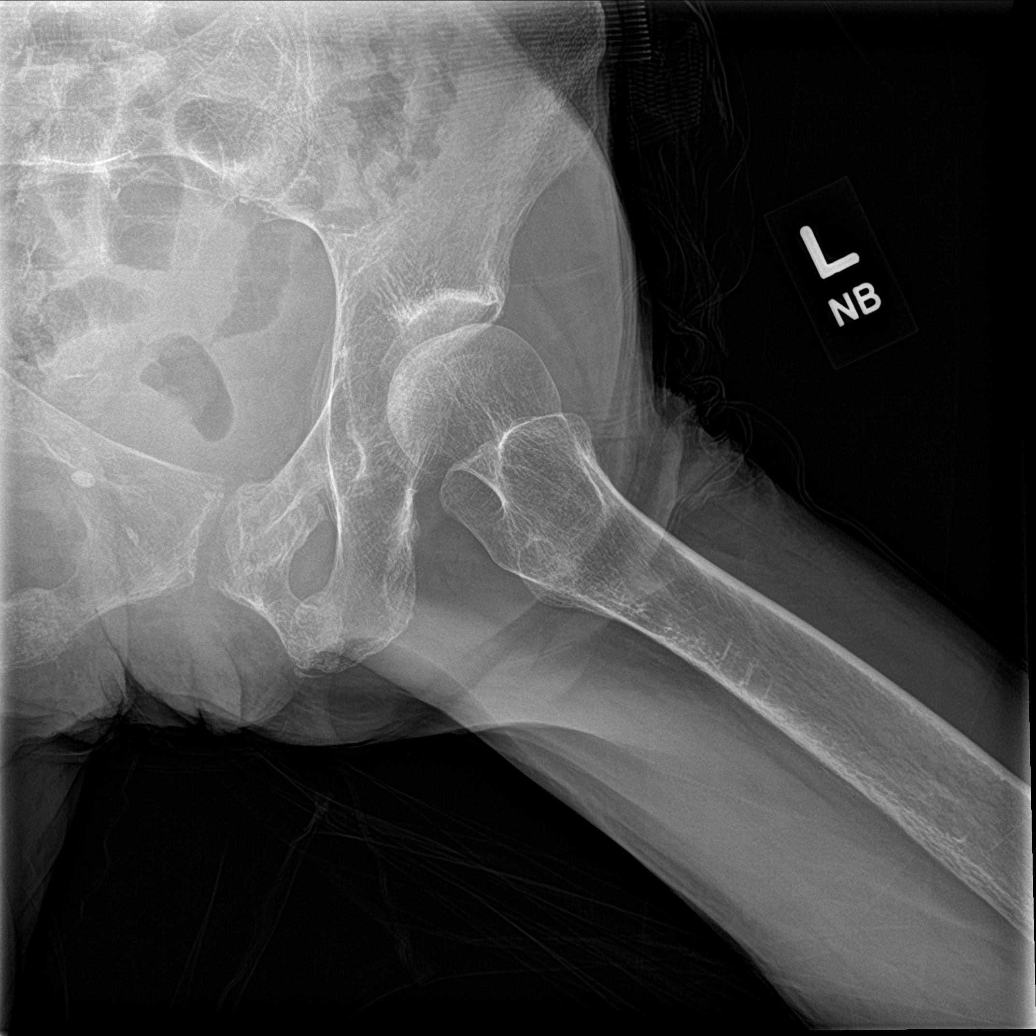

[hip lat (2 of 2)]
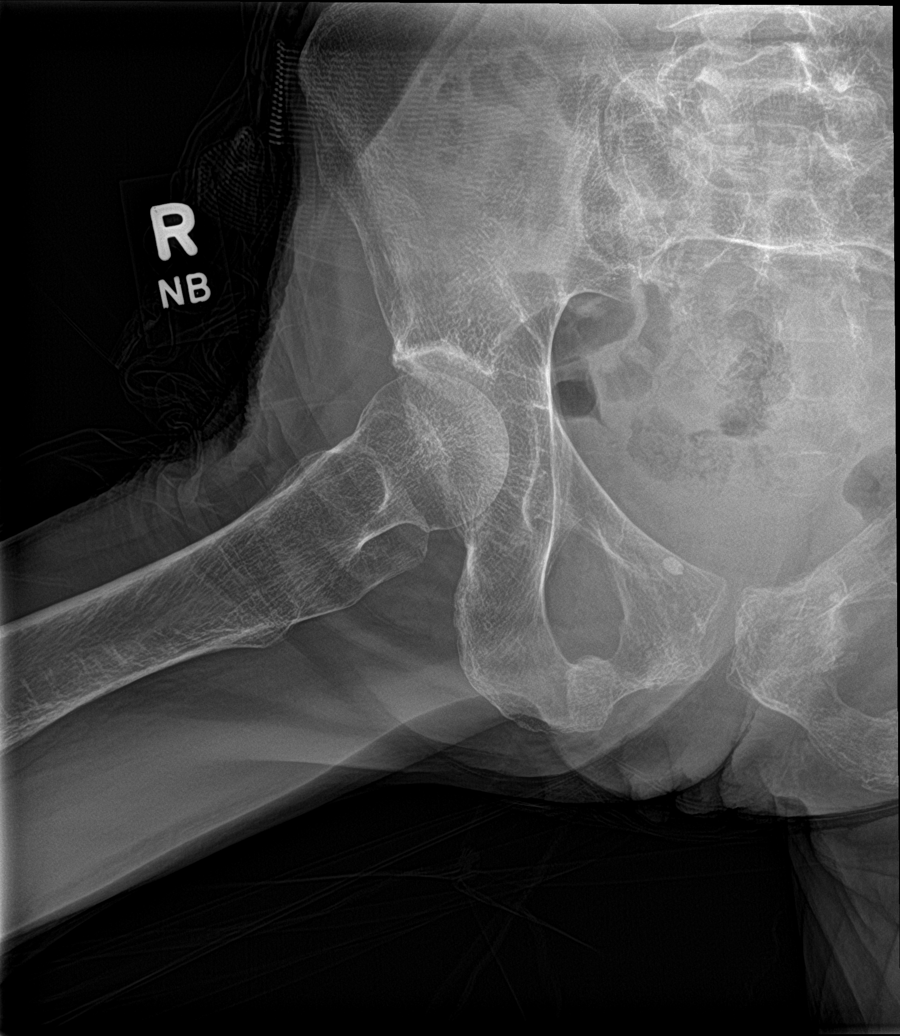

[5 of 5 positions shown; findings below may reference images not displayed]

FINDINGS: No acute fracture or dislocation is noted. Old healed fractures are
seen involving the left superior pubic ramus as well as the right
inferior pubic ramus. Sacroiliac and hip joints are unremarkable.
Chronic deformity of left femoral head is noted which is stable
compared to prior exam.
IMPRESSION: Old bilateral pubic rami fractures are noted. No acute abnormality
is noted.

## 2023-08-23 ENCOUNTER — Ambulatory Visit
Admission: EM | Admit: 2023-08-23 | Discharge: 2023-08-23 | Disposition: A | Discharge status: Home / Self Care | Payer: 59 | Attending: Family Medicine | Admitting: Family Medicine

## 2023-08-23 ENCOUNTER — Other Ambulatory Visit: Payer: Self-pay

## 2023-08-23 DIAGNOSIS — L980 Pyogenic granuloma: Secondary | ICD-10-CM | POA: Diagnosis not present

## 2023-08-23 DIAGNOSIS — L309 Dermatitis, unspecified: Secondary | ICD-10-CM | POA: Diagnosis not present

## 2023-08-23 MED ORDER — TRIAMCINOLONE ACETONIDE 0.1 % EX CREA: 1.0000 | TOPICAL_CREAM | Freq: Two times a day (BID) | CUTANEOUS | 0 refills | Status: DC

## 2023-08-23 NOTE — Discharge Instructions (Signed)
 Return if needed

## 2023-08-23 NOTE — ED Triage Notes (Signed)
 Right big toe pain per patient. "I have a granulation". Was trimming toenails and cut too much. Area has redness and swelling, no bleeding.

## 2023-08-23 NOTE — ED Provider Notes (Signed)
 [facility]    CSN: [id number] Arrival date & time: 08/23/23  0804      History   Chief Complaint No chief complaint on file.   HPI Emma Palmer is a 58 y.o. female.   HPI Patient has a pyogenic granuloma on the edge of her right toenail.  She calls it a granuloma, states its from picking at her toenails. She also has eczema and states that she was once given a prescription of triamcinolone  by dermatologist.  She requested refill Past Medical History:  Diagnosis Date   Anxiety    B12 deficiency    Bulimia    Cancer (HCC)    Depression    GERD (gastroesophageal reflux disease)    Glaucoma    H. pylori infection    + serolgies   Headache(784.0)    Hip dysplasia    left - dr supple   History of Helicobacter pylori infection 10/03/2017   History of pelvic fracture    Hyperlipidemia    Intraocular pressure increase    dr camillo   Iron deficiency anemia    Nephrolithiasis    hx, dr mardy   OP (osteoporosis) 10/24/2006   dexa     Patient Active Problem List   Diagnosis Date Noted   Excessive tanning 05/19/2021   Irritable bowel syndrome with both constipation and diarrhea 05/19/2021   Underweight 05/19/2021   Migraines 04/12/2018   Gastric irritation 10/03/2017   Primary insomnia 10/03/2017   Arthralgia 01/06/2017   Iron deficiency anemia 06/07/2016   OAB (overactive bladder) 06/20/2014   GERD (gastroesophageal reflux disease) 04/15/2014   Vitamin B12 deficiency 02/10/2014   Bulimia nervosa 02/10/2014   Anorexia nervosa, restricting type 02/10/2014   ANXIETY STATE, UNSPECIFIED 04/09/2010   SOLITARY BONE CYST 08/31/2007   Hyperlipidemia 11/15/2006   ARTHRITIS, RIGHT HIP 11/15/2006   OSTEOPOROSIS 11/15/2006   Severe episode of recurrent major depressive disorder, without psychotic features (HCC) 11/15/2006    Past Surgical History:  Procedure Laterality Date   ESOPHAGOGASTRODUODENOSCOPY  05-06-05   dr avram -normal including duodenal bxs    LITHOTRIPSY     stent inserted    OB History   No obstetric history on file.      Home Medications    Prior to Admission medications   Medication Sig Start Date End Date Taking? Authorizing Provider  triamcinolone  cream (KENALOG ) 0.1 % Apply 1 Application topically 2 (two) times daily. 08/23/23  Yes Maranda Jamee Jacob, MD  B Complex-C (B-COMPLEX WITH VITAMIN C) tablet Take 1 tablet by mouth daily.    [provider]  clotrimazole -betamethasone  (LOTRISONE ) cream Apply 1 Application topically daily. 04/21/23   Willo Mini, NP  Cyanocobalamin  (B-12) 500 MCG SUBL     [provider]  Ferrous Sulfate  (ONE VITE FERROUS SULFATE ) 300 MG/6.8ML SOLN Take 5 mLs by mouth daily.    [provider]  Folic Acid  (FOLATE PO) Take 800 mcg by mouth 1 day or 1 dose.    [provider]  Magnesium  400 MG TABS Take by mouth.    [provider]  Multiple Vitamins-Minerals (ZINC PO) 4 mg.    [provider]  temazepam  (RESTORIL ) 30 MG capsule Take 1 capsule (30 mg total) by mouth at bedtime as needed for sleep. TAKE 1 CAPSULE BY MOUTH AT BEDTIME 04/21/23   Willo Mini, NP    Family History Family History  Problem Relation Age of Onset   Hyperlipidemia Mother    Stroke Mother  Blindness Mother    Heart attack Mother    Depression Mother    Heart attack Father 65   Hypertension Father    Depression Father    Hyperlipidemia Father    Heart disease Maternal Grandmother    Stroke Maternal Grandmother    Stroke Maternal Grandfather    Heart disease Paternal Grandmother    Colon cancer Neg Hx     Social History Social History   Tobacco Use   Smoking status: Former    Current packs/day: 0.00    Types: Cigarettes    Quit date: 2018    Years since quitting: 7.1   Smokeless tobacco: Never   Tobacco comments:    1/2 pack or less  Vaping Use   Vaping status: Never Used  Substance Use Topics   Alcohol use: No    Alcohol/week: 0.0 standard  drinks of alcohol   Drug use: No     Allergies   Restasis [cyclosporine] and Other   Review of Systems Review of Systems See HPI  Physical Exam Triage Vital Signs ED Triage Vitals  Encounter Vitals Group     BP 08/23/23 0814 129/85     Systolic BP Percentile --      Diastolic BP Percentile --      Pulse Rate 08/23/23 0814 72     Resp 08/23/23 0814 16     Temp 08/23/23 0814 97.7 F (36.5 C)     Temp Source 08/23/23 0814 Oral     SpO2 08/23/23 0814 100 %     Weight --      Height --      Head Circumference --      Peak Flow --      Pain Score 08/23/23 0818 5     Pain Loc --      Pain Education --      Exclude from Growth Chart --    No data found.  Updated Vital Signs BP 129/85   Pulse 72   Temp 97.7 F (36.5 C) (Oral)   Resp 16   LMP 10/27/2010   SpO2 100%    Physical Exam Constitutional:      General: She is not in acute distress.    Appearance: She is well-developed.  HENT:     Head: Normocephalic and atraumatic.  Eyes:     Conjunctiva/sclera: Conjunctivae normal.     Pupils: Pupils are equal, round, and reactive to light.  Cardiovascular:     Rate and Rhythm: Normal rate.  Pulmonary:     Effort: Pulmonary effort is normal. No respiratory distress.  Abdominal:     General: There is no distension.     Palpations: Abdomen is soft.  Musculoskeletal:        General: Normal range of motion.     Cervical back: Normal range of motion.  Skin:    General: Skin is warm and dry.     Findings: Lesion present.     Comments: Small granuloma at the edge of the right great toenail.  Treated with silver nitrate.  Wound care discusse  Neurological:     Mental Status: She is alert.      UC Treatments / Results  Labs (all labs ordered are listed, but only abnormal results are displayed) Labs Reviewed - No data to display  EKG   Radiology No results found.  Procedures Procedures (including critical care time)  Medications Ordered in  UC Medications - No data to display  Initial Impression /  Assessment and Plan / UC Course  I have reviewed the triage vital signs and the nursing notes.  Pertinent labs & imaging results that were available during my care of the patient were reviewed by me and considered in my medical decision making (see chart for details).      Final Clinical Impressions(s) / UC Diagnoses   Final diagnoses:  Pyogenic granuloma of skin  Eczema, unspecified type     Discharge Instructions      Return if needed   ED Prescriptions     Medication Sig Dispense Auth. Provider   triamcinolone  cream (KENALOG ) 0.1 % Apply 1 Application topically 2 (two) times daily. 30 g Maranda Jamee Jacob, MD      PDMP not reviewed this encounter.   Maranda Jamee Jacob, MD 08/23/23 [phone]

## 2023-09-01 ENCOUNTER — Other Ambulatory Visit: Payer: Self-pay

## 2023-09-01 ENCOUNTER — Ambulatory Visit
Admission: RE | Admit: 2023-09-01 | Discharge: 2023-09-01 | Disposition: A | Discharge status: Home / Self Care | Payer: 59 | Source: Ambulatory Visit | Attending: Family Medicine | Admitting: Family Medicine

## 2023-09-01 VITALS — BP 128/84 | HR 70 | Temp 98.0°F | Resp 16

## 2023-09-01 DIAGNOSIS — L03031 Cellulitis of right toe: Secondary | ICD-10-CM | POA: Diagnosis not present

## 2023-09-01 DIAGNOSIS — R2241 Localized swelling, mass and lump, right lower limb: Secondary | ICD-10-CM | POA: Diagnosis not present

## 2023-09-01 MED ORDER — MUPIROCIN 2 % EX OINT: 1.0000 | TOPICAL_OINTMENT | Freq: Three times a day (TID) | CUTANEOUS | 0 refills | Status: DC

## 2023-09-01 MED ORDER — CEPHALEXIN 500 MG PO CAPS: 500.0000 mg | ORAL_CAPSULE | Freq: Two times a day (BID) | ORAL | 0 refills | Status: DC

## 2023-09-01 NOTE — ED Triage Notes (Signed)
Has a toe granuloma on right big toe, had silver nitrate used on it but it did not fall off after use. Has pain.

## 2023-09-01 NOTE — ED Provider Notes (Signed)
 [name]    CSN: [id number] Arrival date & time: 09/01/23  0820      History   Chief Complaint Chief Complaint  Patient presents with   Toe Injury    Granuloma on right big toe.  Toe Silver Nitrate treatment to eliminate, last week by Dr. Delton See.  Nitrate  treatment didn't work, toe painful,  Infected, swollen, maybe worse than last week. - Entered by patient    HPI Emma Palmer is a 58 y.o. female.   Patient was evaluated in this clinic nine days ago for a small granuloma at the edge of her right great toenail, treated with silver nitrate.  Patient reports that the lesion has persisted, becoming painful and erythematous during the past two days.  There has been no drainage from the area.     Past Medical History:  Diagnosis Date   Anxiety    B12 deficiency    Bulimia    Cancer (HCC)    Depression    GERD (gastroesophageal reflux disease)    Glaucoma    H. pylori infection    + serolgies   Headache(784.0)    Hip dysplasia    left - dr supple   History of Helicobacter pylori infection 10/03/2017   History of pelvic fracture    Hyperlipidemia    Intraocular pressure increase    dr Hazle Quant   Iron deficiency anemia    Nephrolithiasis    hx, dr Aldean Ast   OP (osteoporosis) 10/24/2006   dexa     Patient Active Problem List   Diagnosis Date Noted   Excessive tanning 05/19/2021   Irritable bowel syndrome with both constipation and diarrhea 05/19/2021   Underweight 05/19/2021   Migraines 04/12/2018   Gastric irritation 10/03/2017   Primary insomnia 10/03/2017   Arthralgia 01/06/2017   Iron deficiency anemia 06/07/2016   OAB (overactive bladder) 06/20/2014   GERD (gastroesophageal reflux disease) 04/15/2014   Vitamin B12 deficiency 02/10/2014   Bulimia nervosa 02/10/2014   Anorexia nervosa, restricting type 02/10/2014   ANXIETY STATE, UNSPECIFIED 04/09/2010   SOLITARY BONE CYST 08/31/2007   Hyperlipidemia 11/15/2006   ARTHRITIS, RIGHT HIP  11/15/2006   OSTEOPOROSIS 11/15/2006   Severe episode of recurrent major depressive disorder, without psychotic features (HCC) 11/15/2006    Past Surgical History:  Procedure Laterality Date   ESOPHAGOGASTRODUODENOSCOPY  05-06-05   dr Leone Payor -normal including duodenal bxs   LITHOTRIPSY     stent inserted    OB History   No obstetric history on file.      Home Medications    Prior to Admission medications   Medication Sig Start Date End Date Taking? Authorizing Provider  cephALEXin (KEFLEX) 500 MG capsule Take 1 capsule (500 mg total) by mouth 2 (two) times daily. 09/01/23  Yes Lattie Haw, MD  mupirocin ointment (BACTROBAN) 2 % Apply 1 Application topically 3 (three) times daily. 09/01/23  Yes Lattie Haw, MD  B Complex-C (B-COMPLEX WITH VITAMIN C) tablet Take 1 tablet by mouth daily.    [provider]  clotrimazole-betamethasone (LOTRISONE) cream Apply 1 Application topically daily. 04/21/23   Christen Butter, NP  Cyanocobalamin (B-12) 500 MCG SUBL     [provider]  Ferrous Sulfate (ONE VITE FERROUS SULFATE) 300 MG/6.8ML SOLN Take 5 mLs by mouth daily.    [provider]  Folic Acid (FOLATE PO) Take 800 mcg by mouth 1 day or 1 dose.    [provider]  Magnesium 400 MG TABS  Take by mouth.    [provider]  Multiple Vitamins-Minerals (ZINC PO) 4 mg.    [provider]  temazepam (RESTORIL) 30 MG capsule Take 1 capsule (30 mg total) by mouth at bedtime as needed for sleep. TAKE 1 CAPSULE BY MOUTH AT BEDTIME 04/21/23   Christen Butter, NP  triamcinolone cream (KENALOG) 0.1 % Apply 1 Application topically 2 (two) times daily. 08/23/23   Eustace Moore, MD    Family History Family History  Problem Relation Age of Onset   Hyperlipidemia Mother    Stroke Mother    Blindness Mother    Heart attack Mother    Depression Mother    Heart attack Father 23   Hypertension Father    Depression Father    Hyperlipidemia Father     Heart disease Maternal Grandmother    Stroke Maternal Grandmother    Stroke Maternal Grandfather    Heart disease Paternal Grandmother    Colon cancer Neg Hx     Social History Social History   Tobacco Use   Smoking status: Former    Current packs/day: 0.00    Types: Cigarettes    Quit date: 2018    Years since quitting: 7.1   Smokeless tobacco: Never   Tobacco comments:    1/2 pack or less  Vaping Use   Vaping status: Never Used  Substance Use Topics   Alcohol use: No    Alcohol/week: 0.0 standard drinks of alcohol   Drug use: No     Allergies   Restasis [cyclosporine] and Other   Review of Systems Review of Systems  Constitutional:  Negative for chills, diaphoresis, fatigue and fever.  Skin:  Positive for color change.       Right great toe pain  All other systems reviewed and are negative.    Physical Exam Triage Vital Signs ED Triage Vitals  Encounter Vitals Group     BP 09/01/23 0827 128/84     Systolic BP Percentile --      Diastolic BP Percentile --      Pulse Rate 09/01/23 0827 70     Resp 09/01/23 0827 16     Temp 09/01/23 0827 98 F (36.7 C)     Temp src --      SpO2 09/01/23 0827 100 %     Weight --      Height --      Head Circumference --      Peak Flow --      Pain Score 09/01/23 0830 5     Pain Loc --      Pain Education --      Exclude from Growth Chart --    No data found.  Updated Vital Signs BP 128/84   Pulse 70   Temp 98 F (36.7 C)   Resp 16   LMP 10/27/2010   SpO2 100%   Visual Acuity Right Eye Distance:   Left Eye Distance:   Bilateral Distance:    Right Eye Near:   Left Eye Near:    Bilateral Near:     Physical Exam Vitals and nursing note reviewed.  Constitutional:      General: She is not in acute distress. Eyes:     Pupils: Pupils are equal, round, and reactive to light.  Cardiovascular:     Rate and Rhythm: Normal rate.  Pulmonary:     Effort: Pulmonary effort is normal.  Musculoskeletal:        Feet:  Comments: On the lateral edge of the right great toenail there is moist nodule about 2mm by 4mm with erythema and tenderness on the surrounding lateral edge of the toe.  No fluctuance or drainage present.  The lateral distal corner of the toenail is not clearly visible and is suggestive of recent ingrown nail.  Skin:    General: Skin is warm and dry.  Neurological:     Mental Status: She is alert.      UC Treatments / Results  Labs (all labs ordered are listed, but only abnormal results are displayed) Labs Reviewed - No data to display  EKG   Radiology No results found.  Procedures Procedures (including critical care time)  Medications Ordered in UC Medications - No data to display  Initial Impression / Assessment and Plan / UC Course  I have reviewed the triage vital signs and the nursing notes.  Pertinent labs & imaging results that were available during my care of the patient were reviewed by me and considered in my medical decision making (see chart for details).    Nodule is suggestive of granulation tissue resulting from likely recent ingrown toenail. Begin Keflex 500mg  BID for 5 days, and mupirocin ointment TID. Followup with Family Doctor in one week for laser therapy if nodule persists.  Final Clinical Impressions(s) / UC Diagnoses   Final diagnoses:  Paronychia of great toe of right foot  Skin nodule of toe of right foot     Discharge Instructions      Soak toe in warm water 2 to 3 times daily until healed.  Cover area with bandage until pain and drainage have stopped.    ED Prescriptions     Medication Sig Dispense Auth. Provider   cephALEXin (KEFLEX) 500 MG capsule Take 1 capsule (500 mg total) by mouth 2 (two) times daily. 10 capsule Lattie Haw, MD   mupirocin ointment (BACTROBAN) 2 % Apply 1 Application topically 3 (three) times daily. 15 g Lattie Haw, MD         Lattie Haw, MD 09/03/23 [phone]

## 2023-09-01 NOTE — Discharge Instructions (Signed)
Soak toe in warm water 2 to 3 times daily until healed.  Cover area with bandage until pain and drainage have stopped.

## 2023-09-19 ENCOUNTER — Ambulatory Visit: Payer: 59 | Admitting: Medical-Surgical

## 2023-10-19 ENCOUNTER — Other Ambulatory Visit: Payer: Self-pay | Admitting: Medical-Surgical

## 2023-10-20 ENCOUNTER — Ambulatory Visit (INDEPENDENT_AMBULATORY_CARE_PROVIDER_SITE_OTHER): Admitting: Medical-Surgical

## 2023-10-20 ENCOUNTER — Ambulatory Visit: Payer: 59 | Admitting: Medical-Surgical

## 2023-10-20 ENCOUNTER — Encounter: Payer: Self-pay | Admitting: Medical-Surgical

## 2023-10-20 VITALS — BP 101/66 | HR 73 | Resp 20 | Ht 60.0 in | Wt 78.0 lb

## 2023-10-20 DIAGNOSIS — L989 Disorder of the skin and subcutaneous tissue, unspecified: Secondary | ICD-10-CM | POA: Diagnosis not present

## 2023-10-20 DIAGNOSIS — I739 Peripheral vascular disease, unspecified: Secondary | ICD-10-CM | POA: Diagnosis not present

## 2023-10-20 DIAGNOSIS — F5101 Primary insomnia: Secondary | ICD-10-CM | POA: Diagnosis not present

## 2023-10-20 DIAGNOSIS — R748 Abnormal levels of other serum enzymes: Secondary | ICD-10-CM | POA: Diagnosis not present

## 2023-10-20 DIAGNOSIS — F332 Major depressive disorder, recurrent severe without psychotic features: Secondary | ICD-10-CM

## 2023-10-20 MED ORDER — TEMAZEPAM 30 MG PO CAPS
30.0000 mg | ORAL_CAPSULE | Freq: Every evening | ORAL | 1 refills | Status: DC | PRN
Start: 2023-10-20 — End: 2024-04-22

## 2023-10-20 MED ORDER — TRIAMCINOLONE ACETONIDE 0.1 % EX CREA
1.0000 | TOPICAL_CREAM | Freq: Two times a day (BID) | CUTANEOUS | 0 refills | Status: AC
Start: 2023-10-20 — End: ?

## 2023-10-20 MED ORDER — MUPIROCIN 2 % EX OINT
1.0000 | TOPICAL_OINTMENT | Freq: Three times a day (TID) | CUTANEOUS | 0 refills | Status: DC
Start: 2023-10-20 — End: 2024-02-07

## 2023-10-20 MED ORDER — FLUOXETINE HCL 10 MG PO CAPS
10.0000 mg | ORAL_CAPSULE | Freq: Every day | ORAL | 3 refills | Status: DC
Start: 2023-10-20 — End: 2024-02-11

## 2023-10-20 NOTE — Progress Notes (Signed)
        Established patient visit  History, exam, impression, and plan:  1. Primary insomnia (Primary) Pleasant 58 year old female presenting today with a long history of primary insomnia.  She is currently taking temazepam 30 mg nightly, tolerating well without side effects.  Notes that the medication is working well for her and she is able to get a good night sleep.  Has tried multiple options in the past which were not helpful.  Plan to continue temazepam 30 mg daily.  Refill sent. - temazepam (RESTORIL) 30 MG capsule; Take 1 capsule (30 mg total) by mouth at bedtime as needed for sleep. TAKE 1 CAPSULE BY MOUTH AT BEDTIME  Dispense: 90 capsule; Refill: 1  2. Skin lesion History of random skin lesions that she has been using Bactroban and triamcinolone as needed to help heal things up.  She is requesting refills of these creams for as needed use.  Refills sent. - mupirocin ointment (BACTROBAN) 2 %; Apply 1 Application topically 3 (three) times daily.  Dispense: 15 g; Refill: 0 - triamcinolone cream (KENALOG) 0.1 %; Apply 1 Application topically 2 (two) times daily.  Dispense: 30 g; Refill: 0  3. Intermittent claudication (HCC) Reports that she has significant activity intolerance.  She feels that her feet and toes go numb a lot.  When she is active she also feels a squeezing discomfort in her calves.  Her toes and feet tend to lock up at times.  On exam, she is neurovascularly intact to the bilateral feet.  Pulses 2+, equal.  Exam completed at rest.  Unable to complete exam after increased physical activity.  Plan for ABI evaluation to rule out intermittent claudication. - VAS [place] ABI WITH/WO TBI; Future  4. Severe episode of recurrent major depressive disorder, without psychotic features (HCC) Previously prescribed fluoxetine 10 mg daily which she took for about a month before quitting.  She admits that she is very scared over taking medications so she just stopped it.  Had no side effects and  tolerated the medication well.  Continues to have significant issues with anxiety and depression.  Strongly recommend restarting fluoxetine as it was well-tolerated.  She admits that she will restart it and take it for the full 6 weeks that is recommended to evaluate tolerance and response.  We will have a close follow-up in 4-6 weeks to evaluate need for adjustment of dosing or change of medication. - FLUoxetine (PROZAC) 10 MG capsule; Take 1 capsule (10 mg total) by mouth daily.  Dispense: 90 capsule; Refill: 3  5. Elevated alkaline phosphatase level Most recent labs showed an elevation of her alkaline phosphatase.  Rechecking today. - CMP14+EGFR   Procedures performed this visit: None.  Return in about 6 weeks (around 12/01/2023) for mood follow up.  __________________________________ Thayer Ohm, DNP, APRN, FNP-BC Primary Care and Sports Medicine [facility] [place]

## 2023-10-21 LAB — CMP14+EGFR
ALT: 20 IU/L (ref 0–32)
AST: 28 IU/L (ref 0–40)
Albumin: 4.1 g/dL (ref 3.8–4.9)
Alkaline Phosphatase: 167 IU/L — ABNORMAL HIGH (ref 44–121)
BUN/Creatinine Ratio: 4 — ABNORMAL LOW (ref 9–23)
BUN: 4 mg/dL — ABNORMAL LOW (ref 6–24)
Bilirubin Total: 0.4 mg/dL (ref 0.0–1.2)
CO2: 24 mmol/L (ref 20–29)
Calcium: 9.5 mg/dL (ref 8.7–10.2)
Chloride: 97 mmol/L (ref 96–106)
Creatinine, Ser: 0.96 mg/dL (ref 0.57–1.00)
Globulin, Total: 2 g/dL (ref 1.5–4.5)
Glucose: 83 mg/dL (ref 70–99)
Potassium: 4.7 mmol/L (ref 3.5–5.2)
Sodium: 135 mmol/L (ref 134–144)
Total Protein: 6.1 g/dL (ref 6.0–8.5)
eGFR: 69 mL/min/{1.73_m2} (ref >59)

## 2023-10-23 ENCOUNTER — Encounter: Payer: Self-pay | Admitting: Medical-Surgical

## 2023-10-29 ENCOUNTER — Ambulatory Visit
Admission: EM | Admit: 2023-10-29 | Discharge: 2023-10-29 | Disposition: A | Discharge status: Home / Self Care | Attending: Family Medicine | Admitting: Family Medicine

## 2023-10-29 ENCOUNTER — Other Ambulatory Visit: Payer: Self-pay

## 2023-10-29 DIAGNOSIS — J029 Acute pharyngitis, unspecified: Secondary | ICD-10-CM | POA: Diagnosis not present

## 2023-10-29 MED ORDER — AMOXICILLIN 500 MG PO CAPS: 500.0000 mg | ORAL_CAPSULE | Freq: Two times a day (BID) | ORAL | 0 refills | Status: DC

## 2023-10-29 NOTE — ED Triage Notes (Signed)
 Sore throat and blood in saliva x 2 days. Thinks she has an "injury" from eating honey mustard. Thinks it is esophagitis.

## 2023-10-29 NOTE — ED Provider Notes (Signed)
 [facility]    CSN: [id number] Arrival date & time: 10/29/23  2130      History   Chief Complaint Chief Complaint  Patient presents with   Sore Throat    HPI Emma Palmer is a 58 y.o. female.   Patient known to me from prior visits.  She is here for painful sore throat.  She states that she noticed a sore throat when she was eating food from a restaurant, and thought the honey mustard might be contaminated.  She states her throat became painful after eating, and that she noticed bleeding that lasted for 2 or 3 hours.  Was spitting up streaks of blood.  This stopped but her throat still is sore.  She is requesting amoxicillin. She has no cold symptoms or cough.  No fever or chills.  No headache or body ache.  Denies bleeding was caused from any recent vomiting/bulimia episode    Past Medical History:  Diagnosis Date   Anxiety    B12 deficiency    Bulimia    Cancer (HCC)    Depression    GERD (gastroesophageal reflux disease)    Glaucoma    H. pylori infection    + serolgies   Headache(784.0)    Hip dysplasia    left - dr supple   History of Helicobacter pylori infection 10/03/2017   History of pelvic fracture    Hyperlipidemia    Intraocular pressure increase    dr Danley Dusky   Iron deficiency anemia    Nephrolithiasis    hx, dr Enrigue Harvard   OP (osteoporosis) 10/24/2006   dexa     Patient Active Problem List   Diagnosis Date Noted   Excessive tanning 05/19/2021   Irritable bowel syndrome with both constipation and diarrhea 05/19/2021   Underweight 05/19/2021   Migraines 04/12/2018   Gastric irritation 10/03/2017   Primary insomnia 10/03/2017   Arthralgia 01/06/2017   Iron deficiency anemia 06/07/2016   OAB (overactive bladder) 06/20/2014   GERD (gastroesophageal reflux disease) 04/15/2014   Vitamin B12 deficiency 02/10/2014   Bulimia nervosa 02/10/2014   Anorexia nervosa, restricting type 02/10/2014   ANXIETY STATE, UNSPECIFIED 04/09/2010    SOLITARY BONE CYST 08/31/2007   Hyperlipidemia 11/15/2006   ARTHRITIS, RIGHT HIP 11/15/2006   OSTEOPOROSIS 11/15/2006   Severe episode of recurrent major depressive disorder, without psychotic features (HCC) 11/15/2006    Past Surgical History:  Procedure Laterality Date   ESOPHAGOGASTRODUODENOSCOPY  05-06-05   dr Willy Harvest -normal including duodenal bxs   LITHOTRIPSY     stent inserted    OB History   No obstetric history on file.      Home Medications    Prior to Admission medications   Medication Sig Start Date End Date Taking? Authorizing Provider  amoxicillin (AMOXIL) 500 MG capsule Take 1 capsule (500 mg total) by mouth 2 (two) times daily. 10/29/23  Yes Stephany Ehrich, MD  B Complex-C (B-COMPLEX WITH VITAMIN C) tablet Take 0.5 tablets by mouth daily.    [provider]  Cyanocobalamin (B-12) 500 MCG SUBL     [provider]  FLUoxetine (PROZAC) 10 MG capsule Take 1 capsule (10 mg total) by mouth daily. 10/20/23   Jessup, Joy, NP  Folic Acid (FOLATE PO) Take 800 mcg by mouth 1 day or 1 dose.    [provider]  Magnesium 400 MG TABS Take by mouth.    [provider]  mupirocin ointment (BACTROBAN) 2 % Apply 1 Application topically  3 (three) times daily. 10/20/23   Jessup, Joy, NP  temazepam (RESTORIL) 30 MG capsule Take 1 capsule (30 mg total) by mouth at bedtime as needed for sleep. TAKE 1 CAPSULE BY MOUTH AT BEDTIME 10/20/23   Cherre Cornish, NP  triamcinolone cream (KENALOG) 0.1 % Apply 1 Application topically 2 (two) times daily. 10/20/23   Cherre Cornish, NP    Family History Family History  Problem Relation Age of Onset   Hyperlipidemia Mother    Stroke Mother    Blindness Mother    Heart attack Mother    Depression Mother    Heart attack Father 72   Hypertension Father    Depression Father    Hyperlipidemia Father    Heart disease Maternal Grandmother    Stroke Maternal Grandmother    Stroke Maternal Grandfather    Heart disease  Paternal Grandmother    Colon cancer Neg Hx     Social History Social History   Tobacco Use   Smoking status: Former    Current packs/day: 0.00    Types: Cigarettes    Quit date: 2018    Years since quitting: 7.2   Smokeless tobacco: Never   Tobacco comments:    1/2 pack or less  Vaping Use   Vaping status: Never Used  Substance Use Topics   Alcohol use: No    Alcohol/week: 0.0 standard drinks of alcohol   Drug use: No     Allergies   Restasis [cyclosporine] and Other   Review of Systems Review of Systems  See HPI Physical Exam Triage Vital Signs ED Triage Vitals  Encounter Vitals Group     BP 10/29/23 0923 114/77     Systolic BP Percentile --      Diastolic BP Percentile --      Pulse Rate 10/29/23 0923 64     Resp 10/29/23 0923 16     Temp 10/29/23 0923 97.9 F (36.6 C)     Temp src --      SpO2 10/29/23 0923 100 %     Weight --      Height --      Head Circumference --      Peak Flow --      Pain Score 10/29/23 0927 6     Pain Loc --      Pain Education --      Exclude from Growth Chart --    No data found.  Updated Vital Signs BP 114/77   Pulse 64   Temp 97.9 F (36.6 C)   Resp 16   LMP 10/27/2010   SpO2 100%       Physical Exam Constitutional:      General: She is not in acute distress.    Appearance: She is well-developed. She is ill-appearing.     Comments: Very thin and malnourished in appearance  HENT:     Head: Normocephalic and atraumatic.     Mouth/Throat:     Mouth: Mucous membranes are moist.     Pharynx: Posterior oropharyngeal erythema present.     Tonsils: No tonsillar exudate. 0 on the right. 0 on the left.     Comments: Posterior pharynx generally inflamed Eyes:     Conjunctiva/sclera: Conjunctivae normal.     Pupils: Pupils are equal, round, and reactive to light.  Cardiovascular:     Rate and Rhythm: Normal rate.  Pulmonary:     Effort: Pulmonary effort is normal. No respiratory distress.  Abdominal:  General: There is no distension.     Palpations: Abdomen is soft.     Tenderness: There is no abdominal tenderness.  Musculoskeletal:        General: Normal range of motion.     Cervical back: Normal range of motion.  Lymphadenopathy:     Cervical: No cervical adenopathy.  Skin:    General: Skin is warm and dry.  Neurological:     Mental Status: She is alert.      UC Treatments / Results  Labs (all labs ordered are listed, but only abnormal results are displayed) Labs Reviewed - No data to display  EKG   Radiology No results found.  Procedures Procedures (including critical care time)  Medications Ordered in UC Medications - No data to display  Initial Impression / Assessment and Plan / UC Course  I have reviewed the triage vital signs and the nursing notes.  Pertinent labs & imaging results that were available during my care of the patient were reviewed by me and considered in my medical decision making (see chart for details).     Uncertain by history what is causing patient's sore throat.  She does not feel testing for COVID or strep is indicated.  At her request we will provide amoxicillin but recommended she stop the medicine as soon as she feels better.  She needs to follow-up with her PCP if she continues to have a sensation of pain and bleeding in her throat Final Clinical Impressions(s) / UC Diagnoses   Final diagnoses:  Sore throat     Discharge Instructions      Take the antibiotic as prescribed Lots of cool liquids See your doctor if the problem persists   ED Prescriptions     Medication Sig Dispense Auth. Provider   amoxicillin (AMOXIL) 500 MG capsule Take 1 capsule (500 mg total) by mouth 2 (two) times daily. 14 capsule Stephany Ehrich, MD      PDMP not reviewed this encounter.   Stephany Ehrich, MD 10/29/23 [phone]

## 2023-10-29 NOTE — Discharge Instructions (Signed)
 Take the antibiotic as prescribed Lots of cool liquids See your doctor if the problem persists

## 2023-11-04 ENCOUNTER — Other Ambulatory Visit: Payer: Self-pay

## 2023-11-04 ENCOUNTER — Encounter: Payer: Self-pay | Admitting: Emergency Medicine

## 2023-11-04 ENCOUNTER — Ambulatory Visit
Admission: EM | Admit: 2023-11-04 | Discharge: 2023-11-04 | Disposition: A | Discharge status: Home / Self Care | Attending: Family Medicine | Admitting: Family Medicine

## 2023-11-04 DIAGNOSIS — J029 Acute pharyngitis, unspecified: Secondary | ICD-10-CM

## 2023-11-04 MED ORDER — AMOXICILLIN 500 MG PO CAPS: 500.0000 mg | ORAL_CAPSULE | Freq: Two times a day (BID) | ORAL | 1 refills | Status: DC

## 2023-11-04 NOTE — ED Triage Notes (Signed)
 Patient was seen a couple of days for irritation of throat from possible chemical burn.  Patient states that she still has irritation and a white coat on her uvula.  Wondering if she needs additional days added to her Amoxil .

## 2023-11-04 NOTE — Discharge Instructions (Signed)
 Continue on bland diet/soft foods Drink lots of water Refill the amoxicillin  in case it is needed Please follow-up with Joy if this problem persists.  You may need an ENT evaluation

## 2023-11-04 NOTE — ED Provider Notes (Signed)
 [facility]    CSN: [id number] Arrival date & time: 11/04/23  0817      History   Chief Complaint Chief Complaint  Patient presents with   Sore Throat    HPI Emma Palmer is a 58 y.o. female.   Patient is back for persistent sore throat.  She states that the antibiotic has helped but not cleared the condition completely.  She still has redness.  She sometimes sees white patches.  Her uvula today is swollen.  She states she has not been vomiting.  She has not had acid reflux.  She is trying to be careful with what she eats.  She states she has chronic problems with soreness in her throat, sensitivity of her uvula and soft palate.  She feels the only thing that helps is taking the antibiotic.  She states her current antibiotic is making her feel a bit jittery.  She is requesting a refill in case her sore throat has not resolved by the time she finishes    Past Medical History:  Diagnosis Date   Anxiety    B12 deficiency    Bulimia    Cancer (HCC)    Depression    GERD (gastroesophageal reflux disease)    Glaucoma    H. pylori infection    + serolgies   Headache(784.0)    Hip dysplasia    left - dr supple   History of Helicobacter pylori infection 10/03/2017   History of pelvic fracture    Hyperlipidemia    Intraocular pressure increase    dr Danley Dusky   Iron deficiency anemia    Nephrolithiasis    hx, dr Enrigue Harvard   OP (osteoporosis) 10/24/2006   dexa     Patient Active Problem List   Diagnosis Date Noted   Excessive tanning 05/19/2021   Irritable bowel syndrome with both constipation and diarrhea 05/19/2021   Underweight 05/19/2021   Migraines 04/12/2018   Gastric irritation 10/03/2017   Primary insomnia 10/03/2017   Arthralgia 01/06/2017   Iron deficiency anemia 06/07/2016   OAB (overactive bladder) 06/20/2014   GERD (gastroesophageal reflux disease) 04/15/2014   Vitamin B12 deficiency 02/10/2014   Bulimia nervosa 02/10/2014   Anorexia nervosa,  restricting type 02/10/2014   ANXIETY STATE, UNSPECIFIED 04/09/2010   SOLITARY BONE CYST 08/31/2007   Hyperlipidemia 11/15/2006   ARTHRITIS, RIGHT HIP 11/15/2006   OSTEOPOROSIS 11/15/2006   Severe episode of recurrent major depressive disorder, without psychotic features (HCC) 11/15/2006    Past Surgical History:  Procedure Laterality Date   ESOPHAGOGASTRODUODENOSCOPY  05-06-05   dr Willy Harvest -normal including duodenal bxs   LITHOTRIPSY     stent inserted    OB History   No obstetric history on file.      Home Medications    Prior to Admission medications   Medication Sig Start Date End Date Taking? Authorizing Provider  B Complex-C (B-COMPLEX WITH VITAMIN C) tablet Take 0.5 tablets by mouth daily.   Yes [provider]  Cyanocobalamin  (B-12) 500 MCG SUBL    Yes [provider]  Folic Acid  (FOLATE PO) Take 800 mcg by mouth 1 day or 1 dose.   Yes [provider]  Magnesium  400 MG TABS Take by mouth.   Yes [provider]  mupirocin  ointment (BACTROBAN ) 2 % Apply 1 Application topically 3 (three) times daily. 10/20/23  Yes Cherre Cornish, NP  temazepam  (RESTORIL ) 30 MG capsule Take 1 capsule (30 mg total) by mouth at bedtime as needed for  sleep. TAKE 1 CAPSULE BY MOUTH AT BEDTIME 10/20/23  Yes Cherre Cornish, NP  triamcinolone  cream (KENALOG ) 0.1 % Apply 1 Application topically 2 (two) times daily. 10/20/23  Yes Cherre Cornish, NP  amoxicillin  (AMOXIL ) 500 MG capsule Take 1 capsule (500 mg total) by mouth 2 (two) times daily. 11/04/23   Stephany Ehrich, MD  FLUoxetine  (PROZAC ) 10 MG capsule Take 1 capsule (10 mg total) by mouth daily. 10/20/23   Cherre Cornish, NP    Family History Family History  Problem Relation Age of Onset   Hyperlipidemia Mother    Stroke Mother    Blindness Mother    Heart attack Mother    Depression Mother    Heart attack Father 53   Hypertension Father    Depression Father    Hyperlipidemia Father    Heart disease Maternal  Grandmother    Stroke Maternal Grandmother    Stroke Maternal Grandfather    Heart disease Paternal Grandmother    Colon cancer Neg Hx     Social History Social History   Tobacco Use   Smoking status: Former    Current packs/day: 0.00    Types: Cigarettes    Quit date: 2018    Years since quitting: 7.3   Smokeless tobacco: Never   Tobacco comments:    1/2 pack or less  Vaping Use   Vaping status: Never Used  Substance Use Topics   Alcohol use: No    Alcohol/week: 0.0 standard drinks of alcohol   Drug use: No     Allergies   Restasis [cyclosporine] and Other   Review of Systems Review of Systems See HPI  Physical Exam Triage Vital Signs ED Triage Vitals  Encounter Vitals Group     BP 11/04/23 0840 119/83     Systolic BP Percentile --      Diastolic BP Percentile --      Pulse Rate 11/04/23 0840 78     Resp 11/04/23 0840 16     Temp 11/04/23 0840 99 F (37.2 C)     Temp Source 11/04/23 0840 Oral     SpO2 11/04/23 0840 98 %     Weight --      Height --      Head Circumference --      Peak Flow --      Pain Score 11/04/23 0842 5     Pain Loc --      Pain Education --      Exclude from Growth Chart --    No data found.  Updated Vital Signs BP 119/83 (BP Location: Right Arm)   Pulse 78   Temp 99 F (37.2 C) (Oral)   Resp 16   LMP 10/27/2010   SpO2 98%      Physical Exam Constitutional:      General: She is not in acute distress.    Appearance: She is ill-appearing.     Comments: Chronically ill appearing, small and malnourished  HENT:     Head: Normocephalic and atraumatic.     Mouth/Throat:     Mouth: Mucous membranes are moist.     Pharynx: Uvula midline. Pharyngeal swelling and posterior oropharyngeal erythema present.     Comments: There is some inflammation of the posterior pharynx, mild soft tissue swelling and erythema of uvula. Eyes:     Conjunctiva/sclera: Conjunctivae normal.     Pupils: Pupils are equal, round, and reactive to  light.  Cardiovascular:     Rate and Rhythm:  Normal rate.  Pulmonary:     Effort: Pulmonary effort is normal. No respiratory distress.  Musculoskeletal:        General: Normal range of motion.     Cervical back: Normal range of motion.  Skin:    General: Skin is warm and dry.  Neurological:     Mental Status: She is alert.      UC Treatments / Results  Labs (all labs ordered are listed, but only abnormal results are displayed) Labs Reviewed - No data to display  EKG   Radiology No results found.  Procedures Procedures (including critical care time)  Medications Ordered in UC Medications - No data to display  Initial Impression / Assessment and Plan / UC Course  I have reviewed the triage vital signs and the nursing notes.  Pertinent labs & imaging results that were available during my care of the patient were reviewed by me and considered in my medical decision making (see chart for details).     I told the patient that if she has chronic throat problems she may want to see an ENT doctor.  She has a history of bulimia but states she has not vomited.  Lately.  States that her throat "bleeds".  I have not seen evidence of this level of irritation although her throat does look inflamed.  I encouraged her to see her PCP for possible ENT referral Final Clinical Impressions(s) / UC Diagnoses   Final diagnoses:  Sore throat     Discharge Instructions      Continue on bland diet/soft foods Drink lots of water Refill the amoxicillin  in case it is needed Please follow-up with Joy if this problem persists.  You may need an ENT evaluation    ED Prescriptions     Medication Sig Dispense Auth. Provider   amoxicillin  (AMOXIL ) 500 MG capsule Take 1 capsule (500 mg total) by mouth 2 (two) times daily. 14 capsule Stephany Ehrich, MD      PDMP not reviewed this encounter.   Stephany Ehrich, MD 11/04/23 [phone]

## 2023-11-13 ENCOUNTER — Ambulatory Visit (INDEPENDENT_AMBULATORY_CARE_PROVIDER_SITE_OTHER)

## 2023-11-13 DIAGNOSIS — H539 Unspecified visual disturbance: Secondary | ICD-10-CM | POA: Diagnosis not present

## 2023-11-13 DIAGNOSIS — R519 Headache, unspecified: Secondary | ICD-10-CM

## 2023-11-13 DIAGNOSIS — I6782 Cerebral ischemia: Secondary | ICD-10-CM | POA: Diagnosis not present

## 2023-11-13 MED ORDER — GADOBUTROL 1 MMOL/ML IV SOLN
3.5000 mL | Freq: Once | INTRAVENOUS | Status: AC | PRN
  Administered 2023-11-13: 3.5 mL via INTRAVENOUS

## 2023-11-20 ENCOUNTER — Ambulatory Visit (HOSPITAL_BASED_OUTPATIENT_CLINIC_OR_DEPARTMENT_OTHER)

## 2023-11-21 ENCOUNTER — Other Ambulatory Visit

## 2023-11-27 ENCOUNTER — Encounter: Payer: Self-pay | Admitting: Medical-Surgical

## 2023-11-29 ENCOUNTER — Encounter: Payer: Self-pay | Admitting: Medical-Surgical

## 2023-11-30 ENCOUNTER — Encounter: Payer: Self-pay | Admitting: Medical-Surgical

## 2023-12-01 ENCOUNTER — Ambulatory Visit: Admitting: Medical-Surgical

## 2023-12-12 ENCOUNTER — Ambulatory Visit (HOSPITAL_BASED_OUTPATIENT_CLINIC_OR_DEPARTMENT_OTHER)

## 2024-01-04 ENCOUNTER — Ambulatory Visit (HOSPITAL_BASED_OUTPATIENT_CLINIC_OR_DEPARTMENT_OTHER)

## 2024-01-16 ENCOUNTER — Ambulatory Visit: Admitting: Medical-Surgical

## 2024-02-05 ENCOUNTER — Other Ambulatory Visit: Payer: Self-pay | Admitting: Medical-Surgical

## 2024-02-05 DIAGNOSIS — L989 Disorder of the skin and subcutaneous tissue, unspecified: Secondary | ICD-10-CM

## 2024-02-11 ENCOUNTER — Other Ambulatory Visit: Payer: Self-pay

## 2024-02-11 ENCOUNTER — Encounter: Payer: Self-pay | Admitting: Emergency Medicine

## 2024-02-11 ENCOUNTER — Ambulatory Visit
Admission: EM | Admit: 2024-02-11 | Discharge: 2024-02-11 | Disposition: A | Discharge status: Home / Self Care | Attending: Family Medicine | Admitting: Family Medicine

## 2024-02-11 DIAGNOSIS — H00024 Hordeolum internum left upper eyelid: Secondary | ICD-10-CM | POA: Diagnosis not present

## 2024-02-11 MED ORDER — CEPHALEXIN 250 MG PO CAPS: 250.0000 mg | ORAL_CAPSULE | Freq: Two times a day (BID) | ORAL | 0 refills | Status: AC

## 2024-02-11 NOTE — ED Triage Notes (Signed)
 Patient presents to Urgent Care with complaints of eye problems since 3-4 weeks ago. Patient reports some swelling of the left eye lid and some bumps on the upper eyelid. Today looks the best then before. Will have some blurriness of vision.

## 2024-02-11 NOTE — ED Provider Notes (Signed)
 Emma Palmer    CSN: [id number] Arrival date & time: 02/11/24  9180      History   Chief Complaint Chief Complaint  Patient presents with   Eye Problem    HPI Emma Palmer is a 58 y.o. female.   HPI Pleasant 58 year old female presents with left upper eyelid redness and bumps that have been intermittent for 3 to 4 weeks.  PMH significant for glaucoma, cancer, and bulimia.  Past Medical History:  Diagnosis Date   Anxiety    B12 deficiency    Bulimia    Cancer (HCC)    Depression    GERD (gastroesophageal reflux disease)    Glaucoma    H. pylori infection    + serolgies   Headache(784.0)    Hip dysplasia    left - dr supple   History of Helicobacter pylori infection 10/03/2017   History of pelvic fracture    Hyperlipidemia    Intraocular pressure increase    dr camillo   Iron deficiency anemia    Nephrolithiasis    hx, dr mardy   OP (osteoporosis) 10/24/2006   dexa     Patient Active Problem List   Diagnosis Date Noted   Excessive tanning 05/19/2021   Irritable bowel syndrome with both constipation and diarrhea 05/19/2021   Underweight 05/19/2021   Migraines 04/12/2018   Gastric irritation 10/03/2017   Primary insomnia 10/03/2017   Arthralgia 01/06/2017   Iron deficiency anemia 06/07/2016   OAB (overactive bladder) 06/20/2014   GERD (gastroesophageal reflux disease) 04/15/2014   Vitamin B12 deficiency 02/10/2014   Bulimia nervosa 02/10/2014   Anorexia nervosa, restricting type 02/10/2014   ANXIETY STATE, UNSPECIFIED 04/09/2010   SOLITARY BONE CYST 08/31/2007   Hyperlipidemia 11/15/2006   ARTHRITIS, RIGHT HIP 11/15/2006   OSTEOPOROSIS 11/15/2006   Severe episode of recurrent major depressive disorder, without psychotic features (HCC) 11/15/2006    Past Surgical History:  Procedure Laterality Date   ESOPHAGOGASTRODUODENOSCOPY  05-06-05   dr avram -normal including duodenal bxs   LITHOTRIPSY     stent inserted    OB History    No obstetric history on file.      Home Medications    Prior to Admission medications   Medication Sig Start Date End Date Taking? Authorizing Provider  B Complex-C (B-COMPLEX WITH VITAMIN C) tablet Take 0.5 tablets by mouth daily.   Yes [provider]  cephALEXin  (KEFLEX ) 250 MG capsule Take 1 capsule (250 mg total) by mouth 2 (two) times daily for 5 days. 02/11/24 02/16/24 Yes Teddy Sharper, FNP  Cyanocobalamin  (B-12) 500 MCG SUBL    Yes [provider]  Folic Acid  (FOLATE PO) Take 800 mcg by mouth 1 day or 1 dose.   Yes [provider]  Magnesium  400 MG TABS Take by mouth.   Yes [provider]  mupirocin  ointment (BACTROBAN ) 2 % APPLY 1 APPLICATION TOPICALLY 3 TIMES A DAY 02/07/24  Yes Willo Mini, NP  temazepam  (RESTORIL ) 30 MG capsule Take 1 capsule (30 mg total) by mouth at bedtime as needed for sleep. TAKE 1 CAPSULE BY MOUTH AT BEDTIME 10/20/23  Yes Jessup, Mini, NP  triamcinolone  cream (KENALOG ) 0.1 % Apply 1 Application topically 2 (two) times daily. 10/20/23  Yes Willo Mini, NP    Family History Family History  Problem Relation Age of Onset   Hyperlipidemia Mother    Stroke Mother    Blindness Mother    Heart attack Mother    Depression Mother  Heart attack Father 39   Hypertension Father    Depression Father    Hyperlipidemia Father    Heart disease Maternal Grandmother    Stroke Maternal Grandmother    Stroke Maternal Grandfather    Heart disease Paternal Grandmother    Colon cancer Neg Hx     Social History Social History   Tobacco Use   Smoking status: Former    Current packs/day: 0.00    Types: Cigarettes    Quit date: 2018    Years since quitting: 7.5   Smokeless tobacco: Never   Tobacco comments:    1/2 pack or less  Vaping Use   Vaping status: Never Used  Substance Use Topics   Alcohol use: No    Alcohol/week: 0.0 standard drinks of alcohol   Drug use: No     Allergies   Restasis [cyclosporine] and  Other   Review of Systems Review of Systems  Eyes:        Left upper eyelid redness, and swelling intermittently for 3 weeks  Skin:  Positive for rash.     Physical Exam Triage Vital Signs ED Triage Vitals  Encounter Vitals Group     BP 02/11/24 0844 103/68     Girls Systolic BP Percentile --      Girls Diastolic BP Percentile --      Boys Systolic BP Percentile --      Boys Diastolic BP Percentile --      Pulse Rate 02/11/24 0844 67     Resp 02/11/24 0844 16     Temp 02/11/24 0844 97.9 F (36.6 C)     Temp Source 02/11/24 0844 Oral     SpO2 02/11/24 0844 100 %     Weight --      Height --      Head Circumference --      Peak Flow --      Pain Score 02/11/24 0841 0     Pain Loc --      Pain Education --      Exclude from Growth Chart --    No data found.  Updated Vital Signs BP 103/68 (BP Location: Right Arm)   Pulse 67   Temp 97.9 F (36.6 C) (Oral)   Resp 16   LMP 10/27/2010   SpO2 100%    Physical Exam Vitals and nursing note reviewed.  Constitutional:      Appearance: Normal appearance. She is normal weight. She is ill-appearing.  HENT:     Head: Normocephalic and atraumatic.     Mouth/Throat:     Mouth: Mucous membranes are moist.     Pharynx: Oropharynx is clear.  Eyes:     Extraocular Movements: Extraocular movements intact.     Conjunctiva/sclera: Conjunctivae normal.     Pupils: Pupils are equal, round, and reactive to light.     Comments: Left upper eyelid (adjacent to inner canthus): Mildly erythematous please see image below  Cardiovascular:     Rate and Rhythm: Normal rate and regular rhythm.     Pulses: Normal pulses.     Heart sounds: Normal heart sounds.  Pulmonary:     Effort: Pulmonary effort is normal.     Breath sounds: Normal breath sounds. No wheezing, rhonchi or rales.  Musculoskeletal:        General: Normal range of motion.  Skin:    General: Skin is warm and dry.  Neurological:     General: No focal deficit present.  Mental Status: She is alert and oriented to person, place, and time. Mental status is at baseline.  Psychiatric:        Mood and Affect: Mood normal.        Behavior: Behavior normal.      UC Treatments / Results  Labs (all labs ordered are listed, but only abnormal results are displayed) Labs Reviewed - No data to display  EKG   Radiology No results found.  Procedures Procedures (including critical Palmer time)  Medications Ordered in UC Medications - No data to display  Initial Impression / Assessment and Plan / UC Course  I have reviewed the triage vital signs and the nursing notes.  Pertinent labs & imaging results that were available during my Palmer of the patient were reviewed by me and considered in my medical decision making (see chart for details).     MDM: 1.  Hordeolum internum of the left upper eyelid-Rx'd Keflex  250 mg capsule: Take 1 capsule twice daily x 5 days. Advised patient to take medication as directed with food.  Encouraged to increase daily water intake to 64 ounces per day while taking this medication.  Advised if symptoms worsen and/or unresolved please follow-up with your PCP, optometrist or ophthalmologist for further evaluation.  Patient discharged home, hemodynamically stable. Final Clinical Impressions(s) / UC Diagnoses   Final diagnoses:  Hordeolum internum of left upper eyelid     Discharge Instructions      Advised patient to take medication as directed with food.  Encouraged to increase daily water intake to 64 ounces per day while taking this medication.  Advised if symptoms worsen and/or unresolved please follow-up with your PCP, optometrist or ophthalmologist for further evaluation.     ED Prescriptions     Medication Sig Dispense Auth. Provider   cephALEXin  (KEFLEX ) 250 MG capsule Take 1 capsule (250 mg total) by mouth 2 (two) times daily for 5 days. 10 capsule Trentan Trippe, FNP      PDMP not reviewed this encounter.    Teddy Sharper, FNP 02/11/24 [phone]

## 2024-02-11 NOTE — Discharge Instructions (Addendum)
 Advised patient to take medication as directed with food.  Encouraged to increase daily water intake to 64 ounces per day while taking this medication.  Advised if symptoms worsen and/or unresolved please follow-up with your PCP, optometrist or ophthalmologist for further evaluation.

## 2024-03-04 ENCOUNTER — Emergency Department (HOSPITAL_BASED_OUTPATIENT_CLINIC_OR_DEPARTMENT_OTHER): Admission: EM | Admit: 2024-03-04 | Discharge: 2024-03-04 | Disposition: A | Discharge status: Home / Self Care

## 2024-03-04 ENCOUNTER — Emergency Department (HOSPITAL_BASED_OUTPATIENT_CLINIC_OR_DEPARTMENT_OTHER)

## 2024-03-04 ENCOUNTER — Encounter: Payer: Self-pay | Admitting: Emergency Medicine

## 2024-03-04 ENCOUNTER — Encounter (HOSPITAL_BASED_OUTPATIENT_CLINIC_OR_DEPARTMENT_OTHER): Payer: Self-pay | Admitting: Emergency Medicine

## 2024-03-04 ENCOUNTER — Other Ambulatory Visit: Payer: Self-pay

## 2024-03-04 ENCOUNTER — Ambulatory Visit
Admission: EM | Admit: 2024-03-04 | Discharge: 2024-03-04 | Disposition: A | Discharge status: Home / Self Care | Attending: Family Medicine | Admitting: Family Medicine

## 2024-03-04 DIAGNOSIS — R0602 Shortness of breath: Secondary | ICD-10-CM | POA: Diagnosis not present

## 2024-03-04 DIAGNOSIS — R748 Abnormal levels of other serum enzymes: Secondary | ICD-10-CM | POA: Insufficient documentation

## 2024-03-04 DIAGNOSIS — R109 Unspecified abdominal pain: Secondary | ICD-10-CM

## 2024-03-04 DIAGNOSIS — R1031 Right lower quadrant pain: Secondary | ICD-10-CM | POA: Insufficient documentation

## 2024-03-04 DIAGNOSIS — R1011 Right upper quadrant pain: Secondary | ICD-10-CM | POA: Diagnosis not present

## 2024-03-04 DIAGNOSIS — Z720 Tobacco use: Secondary | ICD-10-CM | POA: Insufficient documentation

## 2024-03-04 DIAGNOSIS — N3289 Other specified disorders of bladder: Secondary | ICD-10-CM | POA: Diagnosis not present

## 2024-03-04 DIAGNOSIS — K760 Fatty (change of) liver, not elsewhere classified: Secondary | ICD-10-CM | POA: Diagnosis not present

## 2024-03-04 LAB — COMPREHENSIVE METABOLIC PANEL WITH GFR
ALT: 39 U/L (ref 0–44)
AST: 31 U/L (ref 15–41)
Albumin: 4.1 g/dL (ref 3.5–5.0)
Alkaline Phosphatase: 107 U/L (ref 38–126)
Anion gap: 11 (ref 5–15)
BUN: <5 mg/dL — ABNORMAL LOW (ref 6–20)
CO2: 27 mmol/L (ref 22–32)
Calcium: 9.2 mg/dL (ref 8.9–10.3)
Chloride: 98 mmol/L (ref 98–111)
Creatinine, Ser: 0.78 mg/dL (ref 0.44–1.00)
GFR, Estimated: >60 mL/min (ref >60)
Glucose, Bld: 82 mg/dL (ref 70–99)
Potassium: 4.5 mmol/L (ref 3.5–5.1)
Sodium: 136 mmol/L (ref 135–145)
Total Bilirubin: 0.4 mg/dL (ref 0.0–1.2)
Total Protein: 6.1 g/dL — ABNORMAL LOW (ref 6.5–8.1)

## 2024-03-04 LAB — POCT URINALYSIS DIP (MANUAL ENTRY)
Bilirubin, UA: NEGATIVE
Blood, UA: NEGATIVE
Glucose, UA: NEGATIVE mg/dL
Ketones, POC UA: NEGATIVE mg/dL
Leukocytes, UA: NEGATIVE
Nitrite, UA: NEGATIVE
Protein Ur, POC: NEGATIVE mg/dL
Spec Grav, UA: 1.01 (ref 1.010–1.025)
Urobilinogen, UA: 0.2 U/dL
pH, UA: 7 (ref 5.0–8.0)

## 2024-03-04 LAB — URINALYSIS, MICROSCOPIC (REFLEX)

## 2024-03-04 LAB — URINALYSIS, ROUTINE W REFLEX MICROSCOPIC
Bilirubin Urine: NEGATIVE
Glucose, UA: NEGATIVE mg/dL
Hgb urine dipstick: NEGATIVE
Ketones, ur: NEGATIVE mg/dL
Nitrite: NEGATIVE
Protein, ur: NEGATIVE mg/dL
Specific Gravity, Urine: <=1.005 (ref 1.005–1.030)
pH: 7 (ref 5.0–8.0)

## 2024-03-04 LAB — CBC
HCT: 36.3 % (ref 36.0–46.0)
Hemoglobin: 12.3 g/dL (ref 12.0–15.0)
MCH: 28.5 pg (ref 26.0–34.0)
MCHC: 33.9 g/dL (ref 30.0–36.0)
MCV: 84.2 fL (ref 80.0–100.0)
Platelets: 365 K/uL (ref 150–400)
RBC: 4.31 MIL/uL (ref 3.87–5.11)
RDW: 16 % — ABNORMAL HIGH (ref 11.5–15.5)
WBC: 4.2 K/uL (ref 4.0–10.5)
nRBC: 0 % (ref 0.0–0.2)

## 2024-03-04 LAB — LIPASE, BLOOD: Lipase: 69 U/L — ABNORMAL HIGH (ref 11–51)

## 2024-03-04 MED ORDER — IOHEXOL 300 MG/ML  SOLN
50.0000 mL | Freq: Once | INTRAMUSCULAR | Status: AC | PRN
  Administered 2024-03-04: 50 mL via INTRAVENOUS

## 2024-03-04 MED ORDER — LACTATED RINGERS IV BOLUS
500.0000 mL | Freq: Once | INTRAVENOUS | Status: AC
  Administered 2024-03-04: 500 mL via INTRAVENOUS

## 2024-03-04 NOTE — ED Provider Notes (Signed)
 [facility]    CSN: [id number] Arrival date & time: 03/04/24  9180      History   Chief Complaint Chief Complaint  Patient presents with   Abdominal Pain    HPI BERNEICE ZETTLEMOYER is a 58 y.o. female.   HPI 58 year old female presents with right upper quadrant pain for 2 weeks.  PMH significant for bulimia, headache, and cancer.  Past Medical History:  Diagnosis Date   Anxiety    B12 deficiency    Bulimia    Cancer (HCC)    Depression    GERD (gastroesophageal reflux disease)    Glaucoma    H. pylori infection    + serolgies   Headache(784.0)    Hip dysplasia    left - dr supple   History of Helicobacter pylori infection 10/03/2017   History of pelvic fracture    Hyperlipidemia    Intraocular pressure increase    dr camillo   Iron deficiency anemia    Nephrolithiasis    hx, dr mardy   OP (osteoporosis) 10/24/2006   dexa     Patient Active Problem List   Diagnosis Date Noted   Excessive tanning 05/19/2021   Irritable bowel syndrome with both constipation and diarrhea 05/19/2021   Underweight 05/19/2021   Migraines 04/12/2018   Gastric irritation 10/03/2017   Primary insomnia 10/03/2017   Arthralgia 01/06/2017   Iron deficiency anemia 06/07/2016   OAB (overactive bladder) 06/20/2014   GERD (gastroesophageal reflux disease) 04/15/2014   Vitamin B12 deficiency 02/10/2014   Bulimia nervosa 02/10/2014   Anorexia nervosa, restricting type 02/10/2014   ANXIETY STATE, UNSPECIFIED 04/09/2010   SOLITARY BONE CYST 08/31/2007   Hyperlipidemia 11/15/2006   ARTHRITIS, RIGHT HIP 11/15/2006   OSTEOPOROSIS 11/15/2006   Severe episode of recurrent major depressive disorder, without psychotic features (HCC) 11/15/2006    Past Surgical History:  Procedure Laterality Date   ESOPHAGOGASTRODUODENOSCOPY  05-06-05   dr avram -normal including duodenal bxs   LITHOTRIPSY     stent inserted    OB History   No obstetric history on file.      Home  Medications    Prior to Admission medications   Medication Sig Start Date End Date Taking? Authorizing Provider  B Complex-C (B-COMPLEX WITH VITAMIN C) tablet Take 0.5 tablets by mouth daily.   Yes [provider]  Cyanocobalamin  (B-12) 500 MCG SUBL    Yes [provider]  Folic Acid  (FOLATE PO) Take 800 mcg by mouth 1 day or 1 dose.   Yes [provider]  Magnesium  400 MG TABS Take by mouth.   Yes [provider]  mupirocin  ointment (BACTROBAN ) 2 % APPLY 1 APPLICATION TOPICALLY 3 TIMES A DAY 02/07/24  Yes Willo Mini, NP  temazepam  (RESTORIL ) 30 MG capsule Take 1 capsule (30 mg total) by mouth at bedtime as needed for sleep. TAKE 1 CAPSULE BY MOUTH AT BEDTIME 10/20/23  Yes Jessup, Mini, NP  triamcinolone  cream (KENALOG ) 0.1 % Apply 1 Application topically 2 (two) times daily. 10/20/23  Yes Willo Mini, NP    Family History Family History  Problem Relation Age of Onset   Hyperlipidemia Mother    Stroke Mother    Blindness Mother    Heart attack Mother    Depression Mother    Heart attack Father 69   Hypertension Father    Depression Father    Hyperlipidemia Father    Heart disease Maternal Grandmother    Stroke Maternal Grandmother  Stroke Maternal Grandfather    Heart disease Paternal Grandmother    Colon cancer Neg Hx     Social History Social History   Tobacco Use   Smoking status: Former    Current packs/day: 0.00    Types: Cigarettes    Quit date: 2018    Years since quitting: 7.6   Smokeless tobacco: Never   Tobacco comments:    1/2 pack or less  Vaping Use   Vaping status: Never Used  Substance Use Topics   Alcohol use: No    Alcohol/week: 0.0 standard drinks of alcohol   Drug use: No     Allergies   Restasis [cyclosporine] and Other   Review of Systems Review of Systems   Physical Exam Triage Vital Signs ED Triage Vitals  Encounter Vitals Group     BP 03/04/24 0923 107/71     Girls Systolic BP Percentile --       Girls Diastolic BP Percentile --      Boys Systolic BP Percentile --      Boys Diastolic BP Percentile --      Pulse Rate 03/04/24 0923 64     Resp 03/04/24 0923 18     Temp 03/04/24 0923 99.1 F (37.3 C)     Temp Source 03/04/24 0923 Oral     SpO2 03/04/24 0923 100 %     Weight --      Height --      Head Circumference --      Peak Flow --      Pain Score 03/04/24 0925 10     Pain Loc --      Pain Education --      Exclude from Growth Chart --    No data found.  Updated Vital Signs BP 107/71 (BP Location: Right Arm)   Pulse 64   Temp 99.1 F (37.3 C) (Oral)   Resp 18   LMP 10/27/2010   SpO2 100%    Physical Exam Vitals and nursing note reviewed.  Constitutional:      Appearance: She is normal weight. She is ill-appearing.     Comments: Extremely thin/cachectic in stature  HENT:     Head: Normocephalic and atraumatic.  Eyes:     Extraocular Movements: Extraocular movements intact.     Pupils: Pupils are equal, round, and reactive to light.  Cardiovascular:     Rate and Rhythm: Normal rate and regular rhythm.     Heart sounds: Normal heart sounds. No murmur heard. Abdominal:     General: Abdomen is flat and scaphoid. Bowel sounds are absent.     Palpations: Abdomen is rigid. There is no shifting dullness, fluid wave, hepatomegaly, splenomegaly, mass or pulsatile mass.     Tenderness: There is abdominal tenderness in the right upper quadrant. There is no right CVA tenderness, left CVA tenderness, guarding or rebound. Negative signs include Murphy's sign, Rovsing's sign, McBurney's sign and obturator sign.     Comments: Patient complaining of 10 out of 10 right upper quadrant abdominal pain  Skin:    General: Skin is warm and dry.  Neurological:     General: No focal deficit present.     Mental Status: She is alert.  Psychiatric:        Mood and Affect: Mood normal.        Behavior: Behavior normal.      UC Treatments / Results  Labs (all labs ordered are  listed, but only abnormal results are  displayed) Labs Reviewed  POCT URINALYSIS DIP (MANUAL ENTRY)    EKG   Radiology No results found.  Procedures Procedures (including critical care time)  Medications Ordered in UC Medications - No data to display  Initial Impression / Assessment and Plan / UC Course  I have reviewed the triage vital signs and the nursing notes.  Pertinent labs & imaging results that were available during my care of the patient were reviewed by me and considered in my medical decision making (see chart for details).     MDM: 1.  Abdominal pain, right upper quadrant-Advised patient to go to [facility] ED now for further evaluation of right upper quadrant abdominal pain.  Patient agreed and verbalized understanding of these instructions and this plan of care this morning.  Patient discharged to ED, hemodynamically stable. Final Clinical Impressions(s) / UC Diagnoses   Final diagnoses:  Abdominal pain, right upper quadrant     Discharge Instructions      Advised patient to go to [facility] ED now for further evaluation of right upper quadrant abdominal pain.     ED Prescriptions   None    PDMP not reviewed this encounter.   Teddy Sharper, FNP 03/04/24 1015

## 2024-03-04 NOTE — ED Notes (Signed)
 Pt d/c home per EDP order. Discharge summary reviewed, pt verbalizes understanding. Ambulatory off unit. NAD.

## 2024-03-04 NOTE — ED Notes (Signed)
 Patient is being discharged from the Urgent Care and sent to the Emergency Department via POV . Per Ozell NP, patient is in need of higher level of care due to RUQ abdominal pain. Patient is aware and verbalizes understanding of plan of care.  Vitals:   03/04/24 0923  BP: 107/71  Pulse: 64  Resp: 18  Temp: 99.1 F (37.3 C)  SpO2: 100%

## 2024-03-04 NOTE — ED Notes (Signed)
 Pt to CT

## 2024-03-04 NOTE — ED Triage Notes (Addendum)
 Abd pain x 2 weeks esp rt sided seen at the [facility] and sent for further tests  had a UA done at UC no n/v/ denies dysuria

## 2024-03-04 NOTE — ED Notes (Signed)
 Lab to add on urine culture to previously collected

## 2024-03-04 NOTE — Discharge Instructions (Addendum)
 I have provided you with the contact information for [facility], please schedule follow-up with them if your abdominal pain persist.  Please discuss your abdominal pain with your primary care provider.  Return to the emergency department if your symptoms worsen.

## 2024-03-04 NOTE — ED Provider Notes (Signed)
 [facility] Provider Note   CSN: [id number] Arrival date & time: 03/04/24  1028     Patient presents with: Abdominal Pain   Emma Palmer is a 58 y.o. female.   58 year old female presenting with right lower quadrant abdominal pain/swelling.  Patient tells me that symptoms have been ongoing for 2 weeks, stating even if I take a sip of water/small bite of food, the pain/swelling begin immediately.  Because of this, she reports that she has not eaten/drank anything today, and she has been hesitant to eat/drink anything if she has plans.  Denies fever, nausea/vomiting, diarrhea, chest pain; she reports feeling winded on occasion with activity, says this is different from her baseline.  History of bulimia and anorexia, patient tells me that she has not been restricting food/drinking due to this but secondary to pain.  She is on a sleep aid but otherwise not on any additional medications.   Abdominal Pain      Prior to Admission medications   Medication Sig Start Date End Date Taking? Authorizing Provider  B Complex-C (B-COMPLEX WITH VITAMIN C) tablet Take 0.5 tablets by mouth daily.    [provider]  Cyanocobalamin  (B-12) 500 MCG SUBL     [provider]  Folic Acid  (FOLATE PO) Take 800 mcg by mouth 1 day or 1 dose.    [provider]  Magnesium  400 MG TABS Take by mouth.    [provider]  mupirocin  ointment (BACTROBAN ) 2 % APPLY 1 APPLICATION TOPICALLY 3 TIMES A DAY 02/07/24   Willo Mini, NP  temazepam  (RESTORIL ) 30 MG capsule Take 1 capsule (30 mg total) by mouth at bedtime as needed for sleep. TAKE 1 CAPSULE BY MOUTH AT BEDTIME 10/20/23   Willo Mini, NP  triamcinolone  cream (KENALOG ) 0.1 % Apply 1 Application topically 2 (two) times daily. 10/20/23   Willo Mini, NP    Allergies: Restasis [cyclosporine] and Other    Review of Systems  Gastrointestinal:  Positive for abdominal pain.    Updated  Vital Signs  Vitals:   03/04/24 1055 03/04/24 1057  BP: 104/87   Pulse: 74   Resp: 18   Temp: 98.4 F (36.9 C)   TempSrc: Oral   SpO2: 98%   Weight:  34 kg  Height:  5' (1.524 m)     Physical Exam Vitals and nursing note reviewed.  Constitutional:      Comments: Extremely thin and cachectic in appearance   HENT:     Head: Normocephalic.  Eyes:     Extraocular Movements: Extraocular movements intact.  Cardiovascular:     Rate and Rhythm: Normal rate.  Pulmonary:     Effort: Pulmonary effort is normal.  Abdominal:     Palpations: Abdomen is soft.     Tenderness: There is abdominal tenderness (generalized but worse in RLQ). There is no guarding.     Comments: RLQ bulging noted with valsalva  Musculoskeletal:     Cervical back: Normal range of motion.     Right lower leg: No edema.     Left lower leg: No edema.     Comments: Moves all extremity spontaneously without difficulty  Skin:    General: Skin is warm and dry.  Neurological:     Mental Status: She is alert and oriented to person, place, and time.     (all labs ordered are listed, but only abnormal results are displayed) Labs Reviewed  LIPASE, BLOOD - Abnormal; Notable  for the following components:      Result Value   Lipase 69 (*)    All other components within normal limits  COMPREHENSIVE METABOLIC PANEL WITH GFR - Abnormal; Notable for the following components:   BUN <5 (*)    Total Protein 6.1 (*)    All other components within normal limits  CBC - Abnormal; Notable for the following components:   RDW 16.0 (*)    All other components within normal limits  URINALYSIS, ROUTINE W REFLEX MICROSCOPIC - Abnormal; Notable for the following components:   Leukocytes,Ua SMALL (*)    All other components within normal limits  URINALYSIS, MICROSCOPIC (REFLEX) - Abnormal; Notable for the following components:   Bacteria, UA FEW (*)    All other components within normal limits  URINE CULTURE     EKG: None  Radiology: CT ABDOMEN PELVIS W CONTRAST Result Date: 03/04/2024 CLINICAL DATA:  abdominal pain/bulging after eating. EXAM: CT ABDOMEN AND PELVIS WITH CONTRAST TECHNIQUE: Multidetector CT imaging of the abdomen and pelvis was performed using the standard protocol following bolus administration of intravenous contrast. RADIATION DOSE REDUCTION: This exam was performed according to the departmental dose-optimization program which includes automated exposure control, adjustment of the mA and/or kV according to patient size and/or use of iterative reconstruction technique. CONTRAST:  50mL OMNIPAQUE  IOHEXOL  300 MG/ML  SOLN COMPARISON:  CT scan abdomen and pelvis from 03/27/2011. FINDINGS: Lower chest: The lung bases are clear. No pleural effusion. The heart is normal in size. No pericardial effusion. Hepatobiliary: The liver is normal in size. Non-cirrhotic configuration. No suspicious mass. These is mild diffuse hepatic steatosis. There is a subcentimeter sized hypoattenuating focus in the left hepatic lobe, segment 2, which is too small to adequately characterize. No intrahepatic or extrahepatic bile duct dilation. No calcified gallstones. Normal gallbladder wall thickness. No pericholecystic inflammatory changes. Pancreas: Unremarkable. No pancreatic ductal dilatation or surrounding inflammatory changes. Spleen: Within normal limits. No focal lesion. Adrenals/Urinary Tract: Adrenal glands are unremarkable. No suspicious renal mass. No hydronephrosis. Bilateral extrarenal pelvis noted. No renal or ureteric calculi. Urinary bladder is under distended, precluding optimal assessment. However, no large mass or stones identified. No perivesical fat stranding. Stomach/Bowel: No disproportionate dilation of the small or large bowel loops. No evidence of abnormal bowel wall thickening or inflammatory changes. The appendix is unremarkable. There is at least moderate stool burden Vascular/Lymphatic: No  ascites or pneumoperitoneum. No abdominal or pelvic lymphadenopathy, by size criteria. No aneurysmal dilation of the major abdominal arteries. Reproductive: The uterus is unremarkable. No large adnexal mass. Other: The visualized soft tissues and abdominal wall are unremarkable. Musculoskeletal: No suspicious osseous lesions. There is diffuse osteopenia of the visualized osseous structures. There are mild multilevel degenerative changes in the visualized spine. Left femoral head flattening noted, as a sequela of avascular necrosis. IMPRESSION: 1. No acute inflammatory process identified within the abdomen or pelvis. No hernia identified. 2. Multiple other nonacute observations, as described above. Electronically Signed   By: Ree Molt M.D.   On: 03/04/2024 15:11   DG Chest Portable 1 View Result Date: 03/04/2024 CLINICAL DATA:  Shortness of breath.  Abdominal pain for 2 weeks. EXAM: PORTABLE CHEST 1 VIEW COMPARISON:  Radiographs 03/19/2023 and 01/21/2023. FINDINGS: The heart size and mediastinal contours are normal. The lungs are clear. There is no pleural effusion or pneumothorax. No acute osseous findings are identified. Skin folds overlie the upper chest, asymmetric to the left. IMPRESSION: No evidence of active cardiopulmonary process. Electronically Signed  By: Elsie Perone M.D.   On: 03/04/2024 13:20     Procedures   Medications Ordered in the ED  lactated ringers  bolus 500 mL (has no administration in time range)                                    Medical Decision Making This patient presents to the ED for concern of abdominal pain/bloating, this involves an extensive number of treatment options, and is a complaint that carries with it a high risk of complications and morbidity.  The differential diagnosis includes pancreatitis, appendicitis, gastritis, peptic ulcer disease, malnutrition   Co morbidities that complicate the patient evaluation  MDD,  bulimia/anorexia   Additional history obtained:  Additional history obtained from record review External records from outside source obtained and reviewed including urgent care note from earlier today   Lab Tests:  I Ordered, and personally interpreted labs.  The pertinent results include: CBC within normal limits.  CMP stable from previous.  Lipase slightly elevated at 69.  Urinalysis notable for small leukocytes with few bacteria, I have a very low suspicion for acute urinary tract infection as patient is not experiencing dysuria, nor are white blood cells present on microscopy as well as nitrite negative, however will send for culture.   Imaging Studies ordered:  I ordered imaging studies including CXR, CT abdomen/pelvis  I independently visualized and interpreted imaging which showed  - CXR: No evidence of active cardiopulmonary process. - CT abdomen/pelvis: 1. No acute inflammatory process identified within the abdomen or pelvis. No hernia identified. 2. Multiple other nonacute observations, as described above.    I agree with the radiologist interpretation   Cardiac Monitoring: / EKG:  The patient was maintained on a cardiac monitor.  I personally viewed and interpreted the cardiac monitored which showed an underlying rhythm of: NSR  Problem List / ED Course / Critical interventions / Medication management  LR bolus for rehydration I have reviewed the patients home medicines and have made adjustments as needed   Social Determinants of Health:  Tobacco use, depression, financial instability   Test / Admission - Considered:  Physical exam is notable as above, patient is extremely thin and cachectic in appearance, she has a history of bulimia/anorexia, it is unclear if this is an ongoing issue and is contributing to her appearance today.  Labs and imaging are notable as above.  Patient's lipase is mildly elevated, however it is not elevated as would be expected with  pancreatitis, and her pain is not epigastric, therefore have a very low suspicion for acute pancreatitis as contributing to her symptoms today.  Her symptoms do not seem to be related to gastritis or peptic ulcer disease, as her pain is primarily in the right lower quadrant.  There were findings consistent with diffuse osteopenia on CT imaging, I suspect that this is secondary to the patient's malnutrition/cachexia, I encouraged her to discuss these findings with her primary care provider.  It is unclear if patient's abdominal pain may have a psychogenic component, as she does have a history of anxiety/bulimia. I have provided her with the contact information for [facility], I encouraged her to schedule a follow-up if her abdominal pain persists.  She voiced understanding and is in agreement with this plan.  She is appropriate for discharge at this time.    Amount and/or Complexity of Data Reviewed Labs: ordered. Radiology: ordered.  Risk  Prescription drug management.        Final diagnoses:  Abdominal pain, unspecified abdominal location  Elevated lipase    ED Discharge Orders     None          Glendia Rocky LOISE DEVONNA 03/04/24 1556    Ula Prentice SAUNDERS, MD 03/05/24 1442

## 2024-03-04 NOTE — ED Triage Notes (Signed)
 Patient c/o RUQ pain x 2 weeks, no urinary sx's, no nausea or vomiting.  Denies any OTC pain.  Has not contacted PCP for follow up.

## 2024-03-04 NOTE — Discharge Instructions (Addendum)
 Advised patient to go to [facility] ED now for further evaluation of right upper quadrant abdominal pain.

## 2024-03-04 NOTE — ED Notes (Signed)
This RN unsuccessful at IV

## 2024-03-05 LAB — URINE CULTURE: Culture: <10000 — AB

## 2024-04-22 ENCOUNTER — Ambulatory Visit (INDEPENDENT_AMBULATORY_CARE_PROVIDER_SITE_OTHER): Admitting: Medical-Surgical

## 2024-04-22 ENCOUNTER — Encounter: Payer: Self-pay | Admitting: Medical-Surgical

## 2024-04-22 VITALS — BP 98/64 | HR 65 | Temp 97.8°F | Resp 18 | Ht 60.0 in | Wt 76.3 lb

## 2024-04-22 DIAGNOSIS — F332 Major depressive disorder, recurrent severe without psychotic features: Secondary | ICD-10-CM | POA: Diagnosis not present

## 2024-04-22 DIAGNOSIS — G629 Polyneuropathy, unspecified: Secondary | ICD-10-CM | POA: Insufficient documentation

## 2024-04-22 DIAGNOSIS — F50012 Anorexia nervosa, restricting type, severe: Secondary | ICD-10-CM

## 2024-04-22 DIAGNOSIS — L989 Disorder of the skin and subcutaneous tissue, unspecified: Secondary | ICD-10-CM | POA: Diagnosis not present

## 2024-04-22 DIAGNOSIS — H01024 Squamous blepharitis left upper eyelid: Secondary | ICD-10-CM

## 2024-04-22 DIAGNOSIS — Z0000 Encounter for general adult medical examination without abnormal findings: Secondary | ICD-10-CM | POA: Diagnosis not present

## 2024-04-22 DIAGNOSIS — Z1231 Encounter for screening mammogram for malignant neoplasm of breast: Secondary | ICD-10-CM

## 2024-04-22 DIAGNOSIS — H01021 Squamous blepharitis right upper eyelid: Secondary | ICD-10-CM

## 2024-04-22 DIAGNOSIS — E538 Deficiency of other specified B group vitamins: Secondary | ICD-10-CM | POA: Diagnosis not present

## 2024-04-22 DIAGNOSIS — F5101 Primary insomnia: Secondary | ICD-10-CM | POA: Diagnosis not present

## 2024-04-22 MED ORDER — ERYTHROMYCIN 5 MG/GM OP OINT: 1.0000 | TOPICAL_OINTMENT | Freq: Three times a day (TID) | OPHTHALMIC | 0 refills | Status: AC

## 2024-04-22 MED ORDER — TEMAZEPAM 30 MG PO CAPS: 30.0000 mg | ORAL_CAPSULE | Freq: Every evening | ORAL | 1 refills | Status: AC | PRN

## 2024-04-22 MED ORDER — FLUOXETINE HCL 10 MG PO CAPS: 10.0000 mg | ORAL_CAPSULE | Freq: Every day | ORAL | 3 refills | Status: AC

## 2024-04-22 MED ORDER — HYDROCODONE-ACETAMINOPHEN 5-325 MG PO TABS: 1.0000 | ORAL_TABLET | Freq: Three times a day (TID) | ORAL | 0 refills | Status: AC | PRN

## 2024-04-22 MED ORDER — MUPIROCIN 2 % EX OINT: TOPICAL_OINTMENT | CUTANEOUS | 0 refills | Status: DC

## 2024-04-22 NOTE — Progress Notes (Signed)
 Complete physical exam  Patient: Emma Palmer   DOB: 10/26/1965   58 y.o. Female  MRN: [id number]  Subjective:    Chief Complaint  Patient presents with   Annual Exam    Patient is here for her annual physical. Patient is fasting this morning   Emma Palmer is a 58 y.o. female who presents today for a complete physical exam. She reports consuming a very restrictive diet. The patient does not participate in regular exercise at present. She generally feels fairly well. She reports sleeping well. She does not have additional problems to discuss today.    Most recent fall risk assessment:    04/22/2024    8:49 AM  Fall Risk   Falls in the past year? 0  Number falls in past yr: 0  Injury with Fall? 0  Risk for fall due to : No Fall Risks  Follow up Falls evaluation completed     Most recent depression screenings:    04/22/2024    8:49 AM 10/20/2023   10:33 AM  PHQ 2/9 Scores  PHQ - 2 Score 6 5  PHQ- 9 Score 10 11    Vision:Within last year and Dental: No current dental problems and Receives regular dental care    Patient Care Team: Willo Mini, NP as PCP - General (Nurse Practitioner)   Outpatient Medications Prior to Visit  Medication Sig   B Complex-C (B-COMPLEX WITH VITAMIN C) tablet Take 0.5 tablets by mouth daily.   Cyanocobalamin  (B-12) 500 MCG SUBL    Folic Acid  (FOLATE PO) Take 800 mcg by mouth 1 day or 1 dose.   Magnesium  400 MG TABS Take by mouth.   triamcinolone  cream (KENALOG ) 0.1 % Apply 1 Application topically 2 (two) times daily.   [DISCONTINUED] mupirocin  ointment (BACTROBAN ) 2 % APPLY 1 APPLICATION TOPICALLY 3 TIMES A DAY   [DISCONTINUED] temazepam  (RESTORIL ) 30 MG capsule Take 1 capsule (30 mg total) by mouth at bedtime as needed for sleep. TAKE 1 CAPSULE BY MOUTH AT BEDTIME   No facility-administered medications prior to visit.    Review of Systems  Constitutional:  Negative for chills, fever, malaise/fatigue and weight loss.  HENT:  Negative  for congestion, ear pain, hearing loss, sinus pain and sore throat.   Eyes:  Negative for blurred vision, photophobia and pain.  Respiratory:  Negative for cough, shortness of breath and wheezing.   Cardiovascular:  Negative for chest pain, palpitations and leg swelling.  Gastrointestinal:  Positive for abdominal pain, constipation and diarrhea. Negative for heartburn, nausea and vomiting.  Genitourinary:  Negative for dysuria, frequency and urgency.  Musculoskeletal:  Positive for myalgias. Negative for falls and neck pain.  Skin:  Negative for itching and rash.  Neurological:  Negative for dizziness, weakness and headaches.  Endo/Heme/Allergies:  Negative for polydipsia. Does not bruise/bleed easily.  Psychiatric/Behavioral:  Negative for depression, substance abuse and suicidal ideas. The patient is nervous/anxious.      Objective:     BP 98/64   Pulse 65   Temp 97.8 F (36.6 C) (Oral)   Resp 18   Ht 5' (1.524 m)   Wt 76 lb 4.8 oz (34.6 kg)   LMP 10/27/2010   SpO2 100%   BMI 14.90 kg/m    Physical Exam Constitutional:      General: She is not in acute distress.    Appearance: Normal appearance. She is cachectic. She is not ill-appearing.  HENT:     Head: Normocephalic and atraumatic.  Right Ear: Tympanic membrane, ear canal and external ear normal. There is no impacted cerumen.     Left Ear: Tympanic membrane, ear canal and external ear normal. There is no impacted cerumen.     Nose: Nose normal. No congestion or rhinorrhea.     Mouth/Throat:     Mouth: Mucous membranes are moist.     Pharynx: No oropharyngeal exudate or posterior oropharyngeal erythema.  Eyes:     General: No scleral icterus.       Right eye: No discharge.        Left eye: No discharge.     Extraocular Movements: Extraocular movements intact.     Conjunctiva/sclera: Conjunctivae normal.     Pupils: Pupils are equal, round, and reactive to light.  Neck:     Thyroid : No thyromegaly.     Vascular:  No carotid bruit or JVD.     Trachea: Trachea normal.  Cardiovascular:     Rate and Rhythm: Normal rate and regular rhythm.     Pulses: Normal pulses.     Heart sounds: Normal heart sounds. No murmur heard.    No friction rub. No gallop.  Pulmonary:     Effort: Pulmonary effort is normal. No respiratory distress.     Breath sounds: Normal breath sounds. No wheezing.  Abdominal:     General: Bowel sounds are normal. There is no distension.     Palpations: Abdomen is soft.     Tenderness: There is no abdominal tenderness. There is no guarding.  Musculoskeletal:        General: Normal range of motion.     Cervical back: Normal range of motion and neck supple.  Lymphadenopathy:     Cervical: No cervical adenopathy.  Skin:    General: Skin is warm and dry.  Neurological:     Mental Status: She is alert and oriented to person, place, and time.     Cranial Nerves: No cranial nerve deficit.  Psychiatric:        Mood and Affect: Mood normal.        Behavior: Behavior normal.        Thought Content: Thought content normal.        Judgment: Judgment normal.      No results found for any visits on 04/22/24.     Assessment & Plan:    Routine Health Maintenance and Physical Exam  Immunization History  Administered Date(s) Administered   Tdap 03/17/2016    Health Maintenance  Topic Date Due   COVID-19 Vaccine (1) Never done   Hepatitis B Vaccines 19-59 Average Risk (1 of 3 - 19+ 3-dose series) Never done   Cervical Cancer Screening (HPV/Pap Cotest)  05/11/2012   Pneumococcal Vaccine: 50+ Years (1 of 1 - PCV) Never done   Mammogram  01/02/2024   Influenza Vaccine  Never done   Fecal DNA (Cologuard)  04/15/2025   DTaP/Tdap/Td (2 - Td or Tdap) 03/17/2026   HPV VACCINES  Aged Out   Meningococcal B Vaccine  Aged Out   Colonoscopy  Discontinued   Hepatitis C Screening  Discontinued   HIV Screening  Discontinued   Zoster Vaccines- Shingrix  Discontinued    Discussed health  benefits of physical activity, and encouraged her to engage in regular exercise appropriate for her age and condition.  1. Annual physical exam (Primary) Checking labs as below. UTD on preventative care. Wellness information provided with AVS. - CBC with Differential/Platelet - Lipid panel - CMP14+EGFR  2.  Vitamin B12 deficiency Checking B12 and folate.  - B12 and Folate Panel  3. Primary insomnia Well controlled with long term use of temazepam . Refilling today.  - temazepam  (RESTORIL ) 30 MG capsule; Take 1 capsule (30 mg total) by mouth at bedtime as needed for sleep. TAKE 1 CAPSULE BY MOUTH AT BEDTIME  Dispense: 90 capsule; Refill: 1  4. Skin lesion Has several skin lesions that were assessed today, found benign. Refilling mupirocin  for prn use due to frequent abrasions doing yardwork.  - mupirocin  ointment (BACTROBAN ) 2 %; APPLY 1 APPLICATION TOPICALLY 3 TIMES A DAY  Dispense: 15 g; Refill: 0  5. Squamous blepharitis of upper eyelids of both eyes Adding erythromycin to upper eyelids TID x 5 days.   6. Severe restricting type anorexia nervosa (HCC) Checking iron and vitamin D . Reiterated recommendation for increase dietary intake and protein goals.  - VITAMIN D  25 Hydroxy (Vit-D Deficiency, Fractures) - Iron, TIBC and Ferritin Panel  7. Encounter for screening mammogram for malignant neoplasm of breast Mammogram ordered.  - MM DIGITAL SCREENING BILATERAL; Future  8. Severe episode of recurrent major depressive disorder, without psychotic features (HCC)  Long discussion regarding continued uncontrolled depression. Multiple conversations held regarding treatment options however she continues to avoid starting medication and/or counseling. Again offered counseling referral, declined. Discussed medication again. She requested me to send in fluoxetine  again as this worked well in the past. Advised that if she does start the fluoxetine , she will need to come back in 4-6 weeks for follow  up. Denies SI/HI.   Return for mood follow up in 4-6 weeks/ 6 month follow up for chronic disease management.   Fanny Agan, NP

## 2024-04-23 ENCOUNTER — Ambulatory Visit: Payer: Self-pay | Admitting: Medical-Surgical

## 2024-04-23 LAB — CBC WITH DIFFERENTIAL/PLATELET
Basophils Absolute: 0.1 x10E3/uL (ref 0.0–0.2)
Basos: 2 %
EOS (ABSOLUTE): 0 x10E3/uL (ref 0.0–0.4)
Eos: 1 %
Hematocrit: 37.8 % (ref 34.0–46.6)
Hemoglobin: 12.3 g/dL (ref 11.1–15.9)
Immature Grans (Abs): 0 x10E3/uL (ref 0.0–0.1)
Immature Granulocytes: 0 %
Lymphocytes Absolute: 1.6 x10E3/uL (ref 0.7–3.1)
Lymphs: 44 %
MCH: 28.7 pg (ref 26.6–33.0)
MCHC: 32.5 g/dL (ref 31.5–35.7)
MCV: 88 fL (ref 79–97)
Monocytes Absolute: 0.3 x10E3/uL (ref 0.1–0.9)
Monocytes: 9 %
Neutrophils Absolute: 1.6 x10E3/uL (ref 1.4–7.0)
Neutrophils: 44 %
Platelets: 460 x10E3/uL — ABNORMAL HIGH (ref 150–450)
RBC: 4.28 x10E6/uL (ref 3.77–5.28)
RDW: 14.1 % (ref 11.7–15.4)
WBC: 3.6 x10E3/uL (ref 3.4–10.8)

## 2024-04-23 LAB — IRON,TIBC AND FERRITIN PANEL
Ferritin: 16 ng/mL (ref 15–150)
Iron Saturation: 14 % — ABNORMAL LOW (ref 15–55)
Iron: 60 ug/dL (ref 27–159)
Total Iron Binding Capacity: 438 ug/dL (ref 250–450)
UIBC: 378 ug/dL (ref 131–425)

## 2024-04-23 LAB — CMP14+EGFR
ALT: 44 IU/L — ABNORMAL HIGH (ref 0–32)
AST: 60 IU/L — ABNORMAL HIGH (ref 0–40)
Albumin: 4 g/dL (ref 3.8–4.9)
Alkaline Phosphatase: 99 IU/L (ref 49–135)
BUN/Creatinine Ratio: 3 — ABNORMAL LOW (ref 9–23)
BUN: 2 mg/dL — ABNORMAL LOW (ref 6–24)
Bilirubin Total: 0.4 mg/dL (ref 0.0–1.2)
CO2: 21 mmol/L (ref 20–29)
Calcium: 9.2 mg/dL (ref 8.7–10.2)
Chloride: 101 mmol/L (ref 96–106)
Creatinine, Ser: 0.72 mg/dL (ref 0.57–1.00)
Globulin, Total: 2.2 g/dL (ref 1.5–4.5)
Glucose: 77 mg/dL (ref 70–99)
Sodium: 138 mmol/L (ref 134–144)
Total Protein: 6.2 g/dL (ref 6.0–8.5)
eGFR: 97 mL/min/1.73 (ref >59)

## 2024-04-23 LAB — LIPID PANEL
Chol/HDL Ratio: 3.5 ratio (ref 0.0–4.4)
Cholesterol, Total: 230 mg/dL — ABNORMAL HIGH (ref 100–199)
HDL: 65 mg/dL (ref >39)
LDL Chol Calc (NIH): 153 mg/dL — ABNORMAL HIGH (ref 0–99)
Triglycerides: 68 mg/dL (ref 0–149)
VLDL Cholesterol Cal: 12 mg/dL (ref 5–40)

## 2024-04-23 LAB — B12 AND FOLATE PANEL
Folate: >20 ng/mL (ref >3.0)
Vitamin B-12: 1816 pg/mL — ABNORMAL HIGH (ref 232–1245)

## 2024-04-23 LAB — VITAMIN D 25 HYDROXY (VIT D DEFICIENCY, FRACTURES): Vit D, 25-Hydroxy: 76.3 ng/mL (ref 30.0–100.0)

## 2024-04-30 ENCOUNTER — Encounter: Payer: Self-pay | Admitting: Medical-Surgical

## 2024-05-03 ENCOUNTER — Telehealth: Payer: Self-pay

## 2024-05-03 NOTE — Telephone Encounter (Signed)
 Left detailed message advising patient to call [facility] for liver function testing. [phone]

## 2024-05-03 NOTE — Telephone Encounter (Signed)
 Copied from CRM [id number]. Topic: Clinical - Medication Question >> May 03, 2024  9:21 AM Anairis L wrote: Reason for CRM:  Patient is returning phone call regarding liver exam price. She will not be home all day so please leave a detail msg on her secure vm if she does not answer.    Thank you.

## 2024-05-07 ENCOUNTER — Other Ambulatory Visit

## 2024-05-10 ENCOUNTER — Telehealth (HOSPITAL_BASED_OUTPATIENT_CLINIC_OR_DEPARTMENT_OTHER): Payer: Self-pay

## 2024-06-03 ENCOUNTER — Ambulatory Visit: Admitting: Medical-Surgical

## 2024-06-26 ENCOUNTER — Other Ambulatory Visit: Payer: Self-pay | Admitting: Medical-Surgical

## 2024-06-26 DIAGNOSIS — L989 Disorder of the skin and subcutaneous tissue, unspecified: Secondary | ICD-10-CM

## 2024-07-04 ENCOUNTER — Telehealth: Payer: Self-pay

## 2024-07-04 NOTE — Telephone Encounter (Signed)
 Left message for a return call. We do not have a recent mammogram or PAP.
